# Patient Record
Sex: Female | Born: 1950 | Race: White | Hispanic: No | Marital: Married | Smoking: Former smoker
Health system: Southern US, Community
[De-identification: ages and names within clinical notes are randomized; demographics above are authoritative.]

## PROBLEM LIST (undated history)

## (undated) DIAGNOSIS — IMO0002 Reserved for concepts with insufficient information to code with codable children: Secondary | ICD-10-CM

## (undated) DIAGNOSIS — N816 Rectocele: Secondary | ICD-10-CM

## (undated) DIAGNOSIS — N809 Endometriosis, unspecified: Secondary | ICD-10-CM

## (undated) DIAGNOSIS — M8580 Other specified disorders of bone density and structure, unspecified site: Secondary | ICD-10-CM

## (undated) DIAGNOSIS — J45909 Unspecified asthma, uncomplicated: Secondary | ICD-10-CM

## (undated) DIAGNOSIS — N800 Endometriosis of uterus: Secondary | ICD-10-CM

## (undated) DIAGNOSIS — G43909 Migraine, unspecified, not intractable, without status migrainosus: Secondary | ICD-10-CM

## (undated) DIAGNOSIS — N951 Menopausal and female climacteric states: Secondary | ICD-10-CM

## (undated) DIAGNOSIS — Z1371 Encounter for nonprocreative screening for genetic disease carrier status: Secondary | ICD-10-CM

## (undated) DIAGNOSIS — N814 Uterovaginal prolapse, unspecified: Secondary | ICD-10-CM

## (undated) DIAGNOSIS — D219 Benign neoplasm of connective and other soft tissue, unspecified: Secondary | ICD-10-CM

## (undated) DIAGNOSIS — N8003 Adenomyosis of the uterus: Secondary | ICD-10-CM

## (undated) DIAGNOSIS — Z9889 Other specified postprocedural states: Secondary | ICD-10-CM

## (undated) DIAGNOSIS — R112 Nausea with vomiting, unspecified: Secondary | ICD-10-CM

## (undated) DIAGNOSIS — E7800 Pure hypercholesterolemia, unspecified: Secondary | ICD-10-CM

## (undated) HISTORY — PX: NASAL SINUS SURGERY: SHX719

## (undated) HISTORY — PX: BLADDER SUSPENSION: SHX72

## (undated) HISTORY — DX: Uterovaginal prolapse, unspecified: N81.4

## (undated) HISTORY — DX: Migraine, unspecified, not intractable, without status migrainosus: G43.909

## (undated) HISTORY — DX: Unspecified asthma, uncomplicated: J45.909

## (undated) HISTORY — DX: Encounter for nonprocreative screening for genetic disease carrier status: Z13.71

## (undated) HISTORY — DX: Endometriosis, unspecified: N80.9

## (undated) HISTORY — PX: HYMENECTOMY: SHX987

## (undated) HISTORY — DX: Reserved for concepts with insufficient information to code with codable children: IMO0002

## (undated) HISTORY — DX: Menopausal and female climacteric states: N95.1

## (undated) HISTORY — PX: TONSILLECTOMY: SUR1361

## (undated) HISTORY — DX: Adenomyosis of the uterus: N80.03

## (undated) HISTORY — PX: APPENDECTOMY: SHX54

## (undated) HISTORY — DX: Endometriosis of uterus: N80.0

## (undated) HISTORY — DX: Rectocele: N81.6

## (undated) HISTORY — DX: Benign neoplasm of connective and other soft tissue, unspecified: D21.9

## (undated) HISTORY — DX: Pure hypercholesterolemia, unspecified: E78.00

## (undated) HISTORY — DX: Other specified disorders of bone density and structure, unspecified site: M85.80

---

## 1997-11-20 HISTORY — PX: BACK SURGERY: SHX140

## 1998-06-21 ENCOUNTER — Other Ambulatory Visit: Admission: RE | Admit: 1998-06-21 | Discharge: 1998-06-21 | Payer: Self-pay | Admitting: Obstetrics and Gynecology

## 1998-10-22 ENCOUNTER — Ambulatory Visit (HOSPITAL_COMMUNITY): Admission: RE | Admit: 1998-10-22 | Discharge: 1998-10-23 | Payer: Self-pay | Admitting: Neurology

## 1998-10-22 ENCOUNTER — Encounter: Payer: Self-pay | Admitting: Neurology

## 1998-11-05 ENCOUNTER — Encounter: Payer: Self-pay | Admitting: Neurosurgery

## 1998-11-09 ENCOUNTER — Encounter: Payer: Self-pay | Admitting: Neurosurgery

## 1998-11-09 ENCOUNTER — Inpatient Hospital Stay (HOSPITAL_COMMUNITY): Admission: RE | Admit: 1998-11-09 | Discharge: 1998-11-10 | Payer: Self-pay | Admitting: Neurosurgery

## 1998-12-21 ENCOUNTER — Other Ambulatory Visit: Admission: RE | Admit: 1998-12-21 | Discharge: 1998-12-21 | Payer: Self-pay | Admitting: Obstetrics and Gynecology

## 1999-10-31 ENCOUNTER — Other Ambulatory Visit: Admission: RE | Admit: 1999-10-31 | Discharge: 1999-10-31 | Payer: Self-pay | Admitting: *Deleted

## 1999-10-31 ENCOUNTER — Encounter (INDEPENDENT_AMBULATORY_CARE_PROVIDER_SITE_OTHER): Payer: Self-pay

## 1999-12-27 ENCOUNTER — Other Ambulatory Visit: Admission: RE | Admit: 1999-12-27 | Discharge: 1999-12-27 | Payer: Self-pay | Admitting: Gynecology

## 2001-01-04 ENCOUNTER — Other Ambulatory Visit: Admission: RE | Admit: 2001-01-04 | Discharge: 2001-01-04 | Payer: Self-pay | Admitting: Obstetrics and Gynecology

## 2001-05-23 ENCOUNTER — Encounter: Payer: Self-pay | Admitting: Gastroenterology

## 2001-05-23 ENCOUNTER — Emergency Department (HOSPITAL_COMMUNITY): Admission: EM | Admit: 2001-05-23 | Discharge: 2001-05-23 | Payer: Self-pay | Admitting: Emergency Medicine

## 2001-05-24 ENCOUNTER — Ambulatory Visit (HOSPITAL_COMMUNITY): Admission: RE | Admit: 2001-05-24 | Discharge: 2001-05-24 | Payer: Self-pay | Admitting: Gastroenterology

## 2001-05-28 ENCOUNTER — Ambulatory Visit (HOSPITAL_COMMUNITY): Admission: RE | Admit: 2001-05-28 | Discharge: 2001-05-28 | Payer: Self-pay | Admitting: Gastroenterology

## 2001-05-28 ENCOUNTER — Encounter: Payer: Self-pay | Admitting: Gastroenterology

## 2001-07-18 ENCOUNTER — Ambulatory Visit (HOSPITAL_COMMUNITY): Admission: RE | Admit: 2001-07-18 | Discharge: 2001-07-18 | Payer: Self-pay | Admitting: Gastroenterology

## 2002-01-08 ENCOUNTER — Other Ambulatory Visit: Admission: RE | Admit: 2002-01-08 | Discharge: 2002-01-08 | Payer: Self-pay | Admitting: Obstetrics and Gynecology

## 2003-12-04 ENCOUNTER — Other Ambulatory Visit: Admission: RE | Admit: 2003-12-04 | Discharge: 2003-12-04 | Payer: Self-pay | Admitting: Obstetrics and Gynecology

## 2004-12-06 ENCOUNTER — Other Ambulatory Visit: Admission: RE | Admit: 2004-12-06 | Discharge: 2004-12-06 | Payer: Self-pay | Admitting: Obstetrics and Gynecology

## 2005-12-11 ENCOUNTER — Other Ambulatory Visit: Admission: RE | Admit: 2005-12-11 | Discharge: 2005-12-11 | Payer: Self-pay | Admitting: Obstetrics and Gynecology

## 2006-12-25 ENCOUNTER — Other Ambulatory Visit: Admission: RE | Admit: 2006-12-25 | Discharge: 2006-12-25 | Payer: Self-pay | Admitting: Obstetrics and Gynecology

## 2008-02-05 ENCOUNTER — Other Ambulatory Visit: Admission: RE | Admit: 2008-02-05 | Discharge: 2008-02-05 | Payer: Self-pay | Admitting: Obstetrics and Gynecology

## 2009-03-03 ENCOUNTER — Encounter: Payer: Self-pay | Admitting: Obstetrics and Gynecology

## 2009-03-03 ENCOUNTER — Other Ambulatory Visit: Admission: RE | Admit: 2009-03-03 | Discharge: 2009-03-03 | Payer: Self-pay | Admitting: Addiction Medicine

## 2009-03-03 ENCOUNTER — Ambulatory Visit: Payer: Self-pay | Admitting: Obstetrics and Gynecology

## 2009-11-20 HISTORY — PX: VAGINAL HYSTERECTOMY: SUR661

## 2010-03-16 ENCOUNTER — Other Ambulatory Visit: Admission: RE | Admit: 2010-03-16 | Discharge: 2010-03-16 | Payer: Self-pay | Admitting: Obstetrics and Gynecology

## 2010-03-16 ENCOUNTER — Ambulatory Visit: Payer: Self-pay | Admitting: Obstetrics and Gynecology

## 2010-08-08 ENCOUNTER — Ambulatory Visit: Payer: Self-pay | Admitting: Obstetrics and Gynecology

## 2010-09-05 ENCOUNTER — Inpatient Hospital Stay (HOSPITAL_COMMUNITY): Admission: RE | Admit: 2010-09-05 | Discharge: 2010-09-07 | Payer: Self-pay | Admitting: Obstetrics and Gynecology

## 2010-09-05 ENCOUNTER — Encounter: Payer: Self-pay | Admitting: Obstetrics and Gynecology

## 2010-09-05 ENCOUNTER — Ambulatory Visit: Payer: Self-pay | Admitting: Obstetrics and Gynecology

## 2010-09-21 ENCOUNTER — Ambulatory Visit: Payer: Self-pay | Admitting: Obstetrics and Gynecology

## 2010-10-06 ENCOUNTER — Ambulatory Visit: Payer: Self-pay | Admitting: Obstetrics and Gynecology

## 2010-11-28 ENCOUNTER — Ambulatory Visit
Admission: RE | Admit: 2010-11-28 | Discharge: 2010-11-28 | Payer: Self-pay | Source: Home / Self Care | Attending: Obstetrics and Gynecology | Admitting: Obstetrics and Gynecology

## 2010-12-09 ENCOUNTER — Other Ambulatory Visit: Payer: Self-pay | Admitting: Gynecology

## 2010-12-09 ENCOUNTER — Ambulatory Visit
Admission: RE | Admit: 2010-12-09 | Discharge: 2010-12-09 | Payer: Self-pay | Source: Home / Self Care | Attending: Gynecology | Admitting: Gynecology

## 2011-02-02 LAB — CBC
HCT: 45.9 % (ref 36.0–46.0)
Hemoglobin: 15.7 g/dL — ABNORMAL HIGH (ref 12.0–15.0)
MCH: 31.5 pg (ref 26.0–34.0)
MCHC: 34.3 g/dL (ref 30.0–36.0)
MCV: 91.8 fL (ref 78.0–100.0)
Platelets: 206 10*3/uL (ref 150–400)
RBC: 5 MIL/uL (ref 3.87–5.11)
RDW: 13.3 % (ref 11.5–15.5)
WBC: 6.5 10*3/uL (ref 4.0–10.5)

## 2011-02-02 LAB — URINE MICROSCOPIC-ADD ON

## 2011-02-02 LAB — URINALYSIS, ROUTINE W REFLEX MICROSCOPIC
Glucose, UA: NEGATIVE mg/dL
Hgb urine dipstick: NEGATIVE
pH: 5.5 (ref 5.0–8.0)

## 2011-02-02 LAB — SURGICAL PCR SCREEN
MRSA, PCR: NEGATIVE
Staphylococcus aureus: NEGATIVE

## 2011-05-08 ENCOUNTER — Encounter (INDEPENDENT_AMBULATORY_CARE_PROVIDER_SITE_OTHER): Payer: BC Managed Care – PPO | Admitting: Obstetrics and Gynecology

## 2011-05-08 DIAGNOSIS — Z01419 Encounter for gynecological examination (general) (routine) without abnormal findings: Secondary | ICD-10-CM

## 2011-05-08 DIAGNOSIS — R82998 Other abnormal findings in urine: Secondary | ICD-10-CM

## 2011-07-14 ENCOUNTER — Other Ambulatory Visit: Payer: Self-pay | Admitting: Obstetrics and Gynecology

## 2011-10-21 HISTORY — PX: COLONOSCOPY: SHX174

## 2012-02-01 ENCOUNTER — Telehealth: Payer: Self-pay | Admitting: *Deleted

## 2012-02-01 MED ORDER — ESTRADIOL 0.5 MG PO TABS: 0.5000 mg | ORAL_TABLET | Freq: Every day | ORAL | Status: DC

## 2012-02-01 NOTE — Telephone Encounter (Signed)
Pt informed with the below note. rx sent to pharmacy 

## 2012-02-01 NOTE — Telephone Encounter (Signed)
Pt called lost her femring in toilet, she would like to know if she could begin oral medication again? Please advise

## 2012-02-01 NOTE — Telephone Encounter (Signed)
Chart please 

## 2012-02-01 NOTE — Telephone Encounter (Signed)
Chart on desk

## 2012-02-01 NOTE — Telephone Encounter (Signed)
Estradiol half a milligram daily. Tell patient is she symptomatic we can increase it to 1 mg daily.

## 2012-02-26 ENCOUNTER — Other Ambulatory Visit: Payer: Self-pay | Admitting: Obstetrics and Gynecology

## 2012-02-26 NOTE — Telephone Encounter (Signed)
Called into pharmacy

## 2012-04-30 ENCOUNTER — Encounter: Payer: Self-pay | Admitting: Gynecology

## 2012-04-30 DIAGNOSIS — IMO0002 Reserved for concepts with insufficient information to code with codable children: Secondary | ICD-10-CM | POA: Insufficient documentation

## 2012-04-30 DIAGNOSIS — D219 Benign neoplasm of connective and other soft tissue, unspecified: Secondary | ICD-10-CM | POA: Insufficient documentation

## 2012-04-30 DIAGNOSIS — M8580 Other specified disorders of bone density and structure, unspecified site: Secondary | ICD-10-CM | POA: Insufficient documentation

## 2012-04-30 DIAGNOSIS — N814 Uterovaginal prolapse, unspecified: Secondary | ICD-10-CM | POA: Insufficient documentation

## 2012-04-30 DIAGNOSIS — G43909 Migraine, unspecified, not intractable, without status migrainosus: Secondary | ICD-10-CM | POA: Insufficient documentation

## 2012-04-30 DIAGNOSIS — R32 Unspecified urinary incontinence: Secondary | ICD-10-CM | POA: Insufficient documentation

## 2012-04-30 DIAGNOSIS — N816 Rectocele: Secondary | ICD-10-CM | POA: Insufficient documentation

## 2012-04-30 DIAGNOSIS — N951 Menopausal and female climacteric states: Secondary | ICD-10-CM | POA: Insufficient documentation

## 2012-04-30 DIAGNOSIS — J45909 Unspecified asthma, uncomplicated: Secondary | ICD-10-CM | POA: Insufficient documentation

## 2012-05-08 ENCOUNTER — Ambulatory Visit (INDEPENDENT_AMBULATORY_CARE_PROVIDER_SITE_OTHER): Payer: BC Managed Care – PPO | Admitting: Obstetrics and Gynecology

## 2012-05-08 ENCOUNTER — Encounter: Payer: Self-pay | Admitting: Obstetrics and Gynecology

## 2012-05-08 VITALS — BP 124/80 | Ht 62.75 in | Wt 149.0 lb

## 2012-05-08 DIAGNOSIS — M8580 Other specified disorders of bone density and structure, unspecified site: Secondary | ICD-10-CM

## 2012-05-08 DIAGNOSIS — Z01419 Encounter for gynecological examination (general) (routine) without abnormal findings: Secondary | ICD-10-CM

## 2012-05-08 DIAGNOSIS — M899 Disorder of bone, unspecified: Secondary | ICD-10-CM

## 2012-05-08 DIAGNOSIS — M949 Disorder of cartilage, unspecified: Secondary | ICD-10-CM

## 2012-05-08 MED ORDER — ESTRADIOL ACETATE 0.1 MG/24HR VA RING: VAGINAL_RING | VAGINAL | Status: DC

## 2012-05-08 NOTE — Addendum Note (Signed)
Addended by: Bertram Savin A on: 05/08/2012 03:13 PM   Modules accepted: Orders

## 2012-05-08 NOTE — Progress Notes (Signed)
The patient came to see me today for her annual GYN exam. She is using a Femring 0.05 mg. She's not having any systemic symptoms on that. She is however having some vaginal dryness. She says it's not bad but she would like to see it better. She is having no vaginal bleeding. She is having no pelvic pain. She is scheduled for yearly mammogram. She is due for followup bone density for osteopenia without an elevated FRAX risk. She's had no fractures. She has never had cervical dysplasia. Her last Pap smear was April 2011 and was normal.  HEENT: Within normal limits. Blanca present. Neck: No masses. Supraclavicular lymph nodes: Not enlarged. Breasts: Examined in both sitting and lying position. Symmetrical without skin changes or masses. Abdomen: Soft no masses guarding or rebound. No hernias. Pelvic: External within normal limits. BUS within normal limits. Vaginal examination shows fair estrogen effect, no cystocele enterocele or rectocele. Cervix and uterus absent. Adnexa within normal limits. Rectovaginal confirmatory. Extremities within normal limits.  Assessment: Menopausal symptoms resolved with Femring. Symptomatic atrophic vaginitis.  Plan: Mammogram. Bone density. Increased Femring from 0.05 mg to 1 mg. Lab work through PCP.  No pap  done.

## 2012-05-10 ENCOUNTER — Telehealth: Payer: Self-pay | Admitting: *Deleted

## 2012-05-10 NOTE — Telephone Encounter (Signed)
Pt called stating she no longer takes Pravastatin Sodium & estradiol 0.5 mg she would like removed from her chart.

## 2012-05-15 ENCOUNTER — Encounter: Payer: Self-pay | Admitting: Obstetrics and Gynecology

## 2012-06-12 ENCOUNTER — Encounter: Payer: Self-pay | Admitting: Obstetrics and Gynecology

## 2012-07-03 ENCOUNTER — Ambulatory Visit (INDEPENDENT_AMBULATORY_CARE_PROVIDER_SITE_OTHER): Payer: BC Managed Care – PPO

## 2012-07-03 DIAGNOSIS — M899 Disorder of bone, unspecified: Secondary | ICD-10-CM

## 2012-07-03 DIAGNOSIS — M8580 Other specified disorders of bone density and structure, unspecified site: Secondary | ICD-10-CM

## 2012-08-19 ENCOUNTER — Ambulatory Visit (INDEPENDENT_AMBULATORY_CARE_PROVIDER_SITE_OTHER): Payer: BC Managed Care – PPO | Admitting: Obstetrics and Gynecology

## 2012-08-19 DIAGNOSIS — N950 Postmenopausal bleeding: Secondary | ICD-10-CM

## 2012-08-19 DIAGNOSIS — R319 Hematuria, unspecified: Secondary | ICD-10-CM

## 2012-08-19 LAB — URINALYSIS W MICROSCOPIC + REFLEX CULTURE
Nitrite: NEGATIVE
Protein, ur: NEGATIVE mg/dL
Urobilinogen, UA: 0.2 mg/dL (ref 0.0–1.0)

## 2012-08-19 MED ORDER — OSELTAMIVIR PHOSPHATE 75 MG PO CAPS: 75.0000 mg | ORAL_CAPSULE | Freq: Two times a day (BID) | ORAL | Status: DC

## 2012-08-19 MED ORDER — CIPROFLOXACIN HCL 500 MG PO TABS: 500.0000 mg | ORAL_TABLET | Freq: Two times a day (BID) | ORAL | Status: DC

## 2012-08-19 MED ORDER — ESTRADIOL 1 MG PO TABS: 1.0000 mg | ORAL_TABLET | Freq: Every day | ORAL | Status: DC

## 2012-08-19 NOTE — Patient Instructions (Signed)
Return to the office in 8 weeks.

## 2012-08-19 NOTE — Progress Notes (Signed)
Patient came to see me today because she noticed blood in her urine after voiding. She is having no other urinary symptoms. She also will notice approximately once a month some vaginal spotting. She uses Femring both for systemic and vaginal estrogen replacement.  Exam: Kennon Portela present. External: Within normal limits. BUS: Within normal limits. Vaginal exam: Some estrogen atrophy. When I removed the ring she had Spot bleeding at the top of the vagina where the ring was touching. Urinalysis was unremarkable with just a few red blood cells.  Assessment: #1. Hematuria #2. Atrophic vaginitis with irritation at site of vaginal ring.  Plan: We took out her Femring. We started on estradiol 1 mg daily. She will return in 8 weeks for exam. Urine culture done. Patient going to [place] and  we give her prescription for Cipro and Tamiflu.

## 2012-08-21 LAB — URINE CULTURE: Colony Count: 6000

## 2012-10-11 ENCOUNTER — Ambulatory Visit (INDEPENDENT_AMBULATORY_CARE_PROVIDER_SITE_OTHER): Payer: BC Managed Care – PPO | Admitting: Obstetrics and Gynecology

## 2012-10-11 DIAGNOSIS — Z780 Asymptomatic menopausal state: Secondary | ICD-10-CM

## 2012-10-11 DIAGNOSIS — N952 Postmenopausal atrophic vaginitis: Secondary | ICD-10-CM

## 2012-10-11 MED ORDER — ESTRADIOL 1 MG PO TABS: 1.0000 mg | ORAL_TABLET | Freq: Every day | ORAL | Status: DC

## 2012-10-11 NOTE — Patient Instructions (Signed)
Continue oral estradiol

## 2012-10-11 NOTE — Progress Notes (Signed)
Patient came back to see me today. We have changed her from vaginal estrogen-Femring to oral estrogen to the ring irritating her vagina and causing some vaginal spotting. Since she is switched she is seeing no more spotting. She also commented that she feels better on oral estrogen and is having better vaginal lubrication.  Exam: Temp Gardener present. External: Within normal limits. BUS: Within normal limits. Vagina: Much better estrogenized with better rugae. No more irritation.  Assessment: Menopausal symptoms resolved. Vaginal spotting resolved.  Plan: Continue estradiol 1 mg daily

## 2012-11-05 ENCOUNTER — Other Ambulatory Visit: Payer: Self-pay | Admitting: Obstetrics and Gynecology

## 2012-12-02 ENCOUNTER — Other Ambulatory Visit: Payer: Self-pay | Admitting: Obstetrics and Gynecology

## 2012-12-10 ENCOUNTER — Telehealth: Payer: Self-pay | Admitting: *Deleted

## 2012-12-10 MED ORDER — AMITRIPTYLINE HCL 25 MG PO TABS: 50.0000 mg | ORAL_TABLET | Freq: Every day | ORAL | Status: DC

## 2012-12-10 NOTE — Telephone Encounter (Signed)
Pt was advised that Dr Audie Box is not going to continue to prescribe this medication. He does not feel comfortable prescribing this for sleep. Dr Reece Agar did not document this in the patients chart. The patient was advised to follow up with a PCP to discuss the need of this medication. A one month supply of the amitriptyline 25 mg 2 qhs will be called to her pharmacy to give her time to find a PCP. To note the patient was not happy with this. KW

## 2012-12-10 NOTE — Telephone Encounter (Signed)
As discussed and documented by Sherrilyn Rist.

## 2012-12-10 NOTE — Telephone Encounter (Signed)
Pt Rx for amitriptyline 25 mg tablets #180 2 tablets at bedtime was denied this am, pt called and said she has been on medication since year 2000 and it helps her sleep. Pt said you will be her physician now. Please advise

## 2012-12-10 NOTE — Telephone Encounter (Signed)
Pharmacy notified.

## 2013-04-07 ENCOUNTER — Other Ambulatory Visit: Payer: Self-pay | Admitting: Cardiovascular Disease

## 2013-04-07 LAB — COMPREHENSIVE METABOLIC PANEL
AST: 39 U/L — ABNORMAL HIGH (ref 0–37)
Albumin: 4.2 g/dL (ref 3.5–5.2)
Alkaline Phosphatase: 59 U/L (ref 39–117)
Glucose, Bld: 87 mg/dL (ref 70–99)
Potassium: 4.1 mEq/L (ref 3.5–5.3)
Sodium: 140 mEq/L (ref 135–145)
Total Protein: 6.7 g/dL (ref 6.0–8.3)

## 2013-04-07 LAB — CBC WITH DIFFERENTIAL/PLATELET
Basophils Relative: 1 % (ref 0–1)
Hemoglobin: 14.9 g/dL (ref 12.0–15.0)
MCHC: 33.9 g/dL (ref 30.0–36.0)
Monocytes Relative: 8 % (ref 3–12)
Neutro Abs: 3.2 10*3/uL (ref 1.7–7.7)
Neutrophils Relative %: 61 % (ref 43–77)
RBC: 5.07 MIL/uL (ref 3.87–5.11)
WBC: 5.2 10*3/uL (ref 4.0–10.5)

## 2013-04-07 LAB — LIPID PANEL
LDL Cholesterol: 88 mg/dL (ref 0–99)
Triglycerides: 89 mg/dL (ref <150)

## 2013-04-07 LAB — TSH: TSH: 3.757 u[IU]/mL (ref 0.350–4.500)

## 2013-04-07 LAB — T4, FREE: Free T4: 0.91 ng/dL (ref 0.80–1.80)

## 2013-04-08 LAB — VITAMIN D 25 HYDROXY (VIT D DEFICIENCY, FRACTURES): Vit D, 25-Hydroxy: 61 ng/mL (ref 30–89)

## 2013-04-17 ENCOUNTER — Ambulatory Visit (HOSPITAL_COMMUNITY): Payer: BC Managed Care – PPO

## 2013-04-18 ENCOUNTER — Ambulatory Visit (HOSPITAL_COMMUNITY)
Admission: RE | Admit: 2013-04-18 | Discharge: 2013-04-18 | Disposition: A | Discharge status: Home / Self Care | Payer: BC Managed Care – PPO | Source: Ambulatory Visit | Attending: Cardiovascular Disease | Admitting: Cardiovascular Disease

## 2013-04-18 ENCOUNTER — Other Ambulatory Visit (HOSPITAL_COMMUNITY): Payer: Self-pay | Admitting: Cardiovascular Disease

## 2013-04-18 DIAGNOSIS — E785 Hyperlipidemia, unspecified: Secondary | ICD-10-CM | POA: Insufficient documentation

## 2013-04-18 DIAGNOSIS — I079 Rheumatic tricuspid valve disease, unspecified: Secondary | ICD-10-CM | POA: Insufficient documentation

## 2013-04-18 DIAGNOSIS — R011 Cardiac murmur, unspecified: Secondary | ICD-10-CM

## 2013-04-18 DIAGNOSIS — Z8249 Family history of ischemic heart disease and other diseases of the circulatory system: Secondary | ICD-10-CM | POA: Insufficient documentation

## 2013-04-18 NOTE — Progress Notes (Signed)
[facility]   2D echo completed 04/18/2013.   Cindy Jeray Shugart, RDCS  

## 2013-04-23 ENCOUNTER — Encounter: Payer: Self-pay | Admitting: Cardiovascular Disease

## 2013-05-27 ENCOUNTER — Other Ambulatory Visit: Payer: Self-pay | Admitting: Cardiovascular Disease

## 2013-05-27 LAB — COMPREHENSIVE METABOLIC PANEL
ALT: 42 U/L — ABNORMAL HIGH (ref 0–35)
AST: 32 U/L (ref 0–37)
Alkaline Phosphatase: 63 U/L (ref 39–117)
Creat: 0.87 mg/dL (ref 0.50–1.10)
Total Bilirubin: 1.1 mg/dL (ref 0.3–1.2)

## 2013-05-27 LAB — SEDIMENTATION RATE: Sed Rate: 4 mm/hr (ref 0–22)

## 2013-06-03 ENCOUNTER — Telehealth (HOSPITAL_COMMUNITY): Payer: Self-pay | Admitting: Cardiovascular Disease

## 2013-06-13 ENCOUNTER — Other Ambulatory Visit: Payer: Self-pay | Admitting: Cardiovascular Disease

## 2013-06-13 LAB — HEPATIC FUNCTION PANEL
Bilirubin, Direct: 0.1 mg/dL (ref 0.0–0.3)
Indirect Bilirubin: 0.8 mg/dL (ref 0.0–0.9)
Total Protein: 6.7 g/dL (ref 6.0–8.3)

## 2013-06-16 LAB — HEPATITIS PANEL, ACUTE
HCV Ab: NEGATIVE
Hep A IgM: NEGATIVE

## 2013-06-26 ENCOUNTER — Telehealth (HOSPITAL_COMMUNITY): Payer: Self-pay | Admitting: Cardiovascular Disease

## 2013-06-27 NOTE — Telephone Encounter (Signed)
Returning your call concerning test results

## 2013-06-27 NOTE — Telephone Encounter (Signed)
Returned call.  Lab results given to pt.  Pt also informed a call was made to her yesterday in regards of scheduling a stress test.  Pt also wanting to talk w/ Pam in Billing.  Stated they have been playing phone tag and she wants to clear that up before scheduling an appt for anything esle.  Pt transferred.

## 2013-07-28 ENCOUNTER — Telehealth: Payer: Self-pay | Admitting: Neurology

## 2013-07-28 NOTE — Telephone Encounter (Signed)
Please assign patient to get scheduled for appt. Patient requesting Dr. Desiree Hane.

## 2013-07-30 NOTE — Telephone Encounter (Signed)
From what I could see, we have not seen pt, (in centricity).  She will need a new referral.

## 2013-07-31 ENCOUNTER — Telehealth: Payer: Self-pay | Admitting: Cardiovascular Disease

## 2013-07-31 NOTE — Telephone Encounter (Signed)
Need refill on Vitamin D 50,ooo units.

## 2013-07-31 NOTE — Telephone Encounter (Signed)
Returned call.  Left message to fax refill request to [phone].

## 2013-08-05 ENCOUNTER — Telehealth: Payer: Self-pay | Admitting: Cardiovascular Disease

## 2013-08-05 NOTE — Telephone Encounter (Signed)
Message forwarded to [facility] Berlinda Last, LPN to discuss w/ Dr. Alanda Amass.  This note and paper chart# [id number] placed on Dr. Kandis Cocking cart.

## 2013-08-05 NOTE — Telephone Encounter (Signed)
She needs a referral for Dr Dohlmeir-Dr Alanda Amass is aware of this. You can text her and let her know that you did this.

## 2013-08-08 ENCOUNTER — Encounter: Payer: Self-pay | Admitting: Neurology

## 2013-08-11 ENCOUNTER — Ambulatory Visit (INDEPENDENT_AMBULATORY_CARE_PROVIDER_SITE_OTHER): Payer: BC Managed Care – PPO | Admitting: Neurology

## 2013-08-11 ENCOUNTER — Encounter: Payer: Self-pay | Admitting: Neurology

## 2013-08-11 VITALS — BP 114/72 | HR 64 | Resp 16 | Ht 63.0 in | Wt 154.0 lb

## 2013-08-11 DIAGNOSIS — G43909 Migraine, unspecified, not intractable, without status migrainosus: Secondary | ICD-10-CM

## 2013-08-11 DIAGNOSIS — R0609 Other forms of dyspnea: Secondary | ICD-10-CM

## 2013-08-11 DIAGNOSIS — R0683 Snoring: Secondary | ICD-10-CM

## 2013-08-11 DIAGNOSIS — G4700 Insomnia, unspecified: Secondary | ICD-10-CM

## 2013-08-11 MED ORDER — AMITRIPTYLINE HCL 50 MG PO TABS: 50.0000 mg | ORAL_TABLET | Freq: Two times a day (BID) | ORAL | Status: DC

## 2013-08-11 MED ORDER — SUMATRIPTAN SUCCINATE 100 MG PO TABS: 100.0000 mg | ORAL_TABLET | ORAL | Status: DC

## 2013-08-11 NOTE — Patient Instructions (Addendum)
[facility]  Provider:  Melvyn Novas, M D  Referring Provider: No ref. provider found Primary Care Physician:  Dr. Susa Griffins, MD, [place] [facility] [place].   Chief Complaint  Patient presents with  . New Evaluation    sleep disorder,[place],rm 10    HPI:  Courtney Horne is a 62 y.o. female  Is seen here as a referral/ revisit  from Dr. Alanda Amass for a migraine and  insomnia evaluation.   The patient had for many years some insomnia, would wake up in the midddle of the night and could fall back asleep  She developed sinuitis and allergies, and the lack of sleep resulted in increasing Sinus and Migraine headaches.  Sleep habits finally changed with menopause around [age] and she had increasing difficulties to fall asleep.  Once her allergy specialist and her ENT declared her "cleared "  By her mid-fourties , she was referred to Dr Nolon Nations and diagnosed with migraines, treated successfully with Imitrex.  Dr. Edyth Gunnels had been her Imitrex and Amitriptyline pre-scriber and her headaches and sleep were again well controlled.   Work hours  7 Am to 12 noon, just started part time a couple of month ago. Sleepiness after  Lunch , but  Napping a few days a week. She likes  one cup of coffee  in AM and one in the afternoon at 3 PM. Not a  Soda,  tea,  But a water drinker.  Bedtimes are  10.30 lights out, and sleeps within 30 minutes or less, one bathroom break, snoring rarely- no observed apneas.  Not sleepy as a child, not sleep walking or sleep talking, and she has not been suffering from night terrors. She dreams,but  only occasionally recalls her dreams.  She sometimes wakes up with headaches, not from headaches, she sees a correlation to dehydration.   ETOH may provoke headaches.  Weather changes will bring on headache.    FSS 14, GDS 0 , Epworth 6 .           Pharmacist at [employer].      Review of Systems: Out of a complete 14 system review,  the patient complains of only the following symptoms, and all other reviewed systems are negative. See above -   History   Social History  . Marital Status: Married    Spouse Name: [name]    Number of Children: 2  . Years of Education: 16   Occupational History  .      [employer]   Social History Main Topics  . Smoking status: Former Smoker    Quit date: 05/08/1972  . Smokeless tobacco: Never Used  . Alcohol Use: Yes     Comment: occas. wine(twice weekly)  . Drug Use: No  . Sexual Activity: Yes    Birth Control/ Protection: Surgical   Other Topics Concern  . Not on file   Social History Narrative  . No narrative on file    Family History  Problem Relation Age of Onset  . Diabetes Mother   . Leukemia Mother   . Cancer Mother     leukemia  . Heart disease Father   . Retinitis pigmentosa Father   . Migraines Father     Past Medical History  Diagnosis Date  . Osteopenia   . Fibroid   . Adenomyosis   . Uterine prolapse   . Urinary incontinence   . Cystocele   . Rectocele   . Menopausal symptoms   . Migraine   .  Asthma   . Asthma   . High cholesterol   . Migraine     Past Surgical History  Procedure Laterality Date  . Appendectomy    . Vaginal hysterectomy  2011    Vag. Hyst BSO A/P repair-Sling  . Tonsillectomy    . Nasal sinus surgery    . Back surgery  1999    L4-L5  . Colonoscopy  10/2011    Current Outpatient Prescriptions  Medication Sig Dispense Refill  . amitriptyline (ELAVIL) 50 MG tablet Take 50 mg by mouth 2 (two) times daily.      Marland Kitchen aspirin 81 MG tablet Take 81 mg by mouth daily.      Marland Kitchen atorvastatin (LIPITOR) 40 MG tablet Take 40 mg by mouth daily.      . cetirizine (ZYRTEC) 10 MG tablet Take 10 mg by mouth daily.      . ciprofloxacin (CIPRO) 500 MG tablet Take 500 mg by mouth 2 (two) times daily. As needed      . estradiol (ESTRACE) 1 MG tablet Take 1 tablet (1 mg total) by mouth daily.  90 tablet  4  . Multiple Vitamin  (MULTIVITAMIN) tablet Take 1 tablet by mouth daily.      . Pantoprazole Sodium (PROTONIX PO) Take by mouth.      . Pravastatin Sodium (PRAVACHOL PO) Take by mouth.      . SUMAtriptan (IMITREX) 100 MG tablet TAKE 1 TABLET AT ONSET OF MIGRAINE. MAY REPEAT ONCE IN 2 HOURS. MAX 2/24 HOURS  27 tablet  5  . Vitamin D, Ergocalciferol, (DRISDOL) 50000 UNITS CAPS TAKE 1 CAPSULE WEEKLY. **NEED YEARLY CHECK UP NOW :) **  4 capsule  0   No current facility-administered medications for this visit.    Allergies as of 08/11/2013 - Review Complete 08/11/2013  Allergen Reaction Noted  . Codeine Nausea And Vomiting 04/30/2012  . Penicillins  04/30/2012    Vitals: BP 114/72  Pulse 64  Resp 16  Ht 5\' 3"  (1.6 m)  Wt 154 lb (69.854 kg)  BMI 27.29 kg/m2 Last Weight:  Wt Readings from Last 1 Encounters:  08/11/13 154 lb (69.854 kg)   Last Height:   Ht Readings from Last 1 Encounters:  08/11/13 5\' 3"  (1.6 m)    Physical exam:  General: The patient is awake, alert and appears not in acute distress. The patient is well groomed. Head: Normocephalic, atraumatic. Neck is supple.no goiter ,  Mallampati 2 , neck circumference:13.75 Cardiovascular:  Regular rate and rhythm, without  murmurs or carotid bruit, and without distended neck veins. Respiratory: Lungs are clear to auscultation. Skin:  Without evidence of edema, or rash Trunk: BMI is normal .  Neurologic exam : The patient is awake and alert, oriented to place and time.  Memory subjective  described as intact. There is a normal attention span & concentration ability.  Speech is fluent without  dysarthria, dysphonia or aphasia. Mood and affect are appropriate.  Cranial nerves: Pupils are equal and briskly reactive to light. Funduscopic exam without  evidence of pallor or edema. Extraocular movements  in vertical and horizontal planes intact and without nystagmus. Visual fields by finger perimetry are intact. Hearing to finger rub intact.  Facial  sensation intact to fine touch. Facial motor strength is symmetric and tongue and uvula move midline.  Motor exam:   Normal tone and normal muscle bulk and symmetric normal strength in all extremities. Sensory:  Fine touch, pinprick and vibration were tested in all extremities.  Proprioception is normal. Coordination: Rapid alternating movements in the fingers/hands is tested and normal. Finger-to-nose maneuver without evidence of ataxia, dysmetria or tremor. Gait and station: Patient walks without assistive device. Strength within normal limits. Stance is stable and normal.   Deep tendon reflexes: in the  upper and lower extremities are symmetric and intact.  Babinski maneuver downgoing.   Assessment:  After physical and neurologic examination, review of laboratory studies, imaging, neurophysiology testing and pre-existing records. 1) chronic mild Insomnia. Menopausal onset of worsening. 50mg  amitrityline nightly since 2005 .  2)  Snoring, but not convinced she has apnea.  status post tonsillectomy. Elongated uvula, not a high grade mallompati. Snoring, dry mouth .  3) not obese -and physically active.  Plan ; screening for OSA at home in a HST,  And if positive follow with titration.  Refill medications.              Insomnia Insomnia is frequent trouble falling and/or staying asleep. Insomnia can be a long term problem or a short term problem. Both are common. Insomnia can be a short term problem when the wakefulness is related to a certain stress or worry. Long term insomnia is often related to ongoing stress during waking hours and/or poor sleeping habits. Overtime, sleep deprivation itself can make the problem worse. Every little thing feels more severe because you are overtired and your ability to cope is decreased. CAUSES   Stress, anxiety, and depression.  Poor sleeping habits.  Distractions such as TV in the bedroom.  Naps close to bedtime.  Engaging in emotionally  charged conversations before bed.  Technical reading before sleep.  Alcohol and other sedatives. They may make the problem worse. They can hurt normal sleep patterns and normal dream activity.  Stimulants such as caffeine for several hours prior to bedtime.  Pain syndromes and shortness of breath can cause insomnia.  Exercise late at night.  Changing time zones may cause sleeping problems (jet lag). It is sometimes helpful to have someone observe your sleeping patterns. They should look for periods of not breathing during the night (sleep apnea). They should also look to see how long those periods last. If you live alone or observers are uncertain, you can also be observed at a sleep clinic where your sleep patterns will be professionally monitored. Sleep apnea requires a checkup and treatment. Give your caregivers your medical history. Give your caregivers observations your family has made about your sleep.  SYMPTOMS   Not feeling rested in the morning.  Anxiety and restlessness at bedtime.  Difficulty falling and staying asleep. TREATMENT   Your caregiver may prescribe treatment for an underlying medical disorders. Your caregiver can give advice or help if you are using alcohol or other drugs for self-medication. Treatment of underlying problems will usually eliminate insomnia problems.  Medications can be prescribed for short time use. They are generally not recommended for lengthy use.  Over-the-counter sleep medicines are not recommended for lengthy use. They can be habit forming.  You can promote easier sleeping by making lifestyle changes such as:  Using relaxation techniques that help with breathing and reduce muscle tension.  Exercising earlier in the day.  Changing your diet and the time of your last meal. No night time snacks.  Establish a regular time to go to bed.  Counseling can help with stressful problems and worry.  Soothing music and white noise may be  helpful if there are background noises you cannot remove.  Stop tedious detailed  work at least one hour before bedtime. HOME CARE INSTRUCTIONS   Keep a diary. Inform your caregiver about your progress. This includes any medication side effects. See your caregiver regularly. Take note of:  Times when you are asleep.  Times when you are awake during the night.  The quality of your sleep.  How you feel the next day. This information will help your caregiver care for you.  Get out of bed if you are still awake after 15 minutes. Read or do some quiet activity. Keep the lights down. Wait until you feel sleepy and go back to bed.  Keep regular sleeping and waking hours. Avoid naps.  Exercise regularly.  Avoid distractions at bedtime. Distractions include watching television or engaging in any intense or detailed activity like attempting to balance the household checkbook.  Develop a bedtime ritual. Keep a familiar routine of bathing, brushing your teeth, climbing into bed at the same time each night, listening to soothing music. Routines increase the success of falling to sleep faster.  Use relaxation techniques. This can be using breathing and muscle tension release routines. It can also include visualizing peaceful scenes. You can also help control troubling or intruding thoughts by keeping your mind occupied with boring or repetitive thoughts like the old concept of counting sheep. You can make it more creative like imagining planting one beautiful flower after another in your backyard garden.  During your day, work to eliminate stress. When this is not possible use some of the previous suggestions to help reduce the anxiety that accompanies stressful situations. MAKE SURE YOU:   Understand these instructions.  Will watch your condition.  Will get help right away if you are not doing well or get worse. Document Released: 11/03/2000 Document Revised: 01/29/2012 Document Reviewed:  12/04/2007 [facility] Patient Information 2014 [place], [place]. Insomnia Insomnia is frequent trouble falling and/or staying asleep. Insomnia can be a long term problem or a short term problem. Both are common. Insomnia can be a short term problem when the wakefulness is related to a certain stress or worry. Long term insomnia is often related to ongoing stress during waking hours and/or poor sleeping habits. Overtime, sleep deprivation itself can make the problem worse. Every little thing feels more severe because you are overtired and your ability to cope is decreased. CAUSES   Stress, anxiety, and depression.  Poor sleeping habits.  Distractions such as TV in the bedroom.  Naps close to bedtime.  Engaging in emotionally charged conversations before bed.  Technical reading before sleep.  Alcohol and other sedatives. They may make the problem worse. They can hurt normal sleep patterns and normal dream activity.  Stimulants such as caffeine for several hours prior to bedtime.  Pain syndromes and shortness of breath can cause insomnia.  Exercise late at night.  Changing time zones may cause sleeping problems (jet lag). It is sometimes helpful to have someone observe your sleeping patterns. They should look for periods of not breathing during the night (sleep apnea). They should also look to see how long those periods last. If you live alone or observers are uncertain, you can also be observed at a sleep clinic where your sleep patterns will be professionally monitored. Sleep apnea requires a checkup and treatment. Give your caregivers your medical history. Give your caregivers observations your family has made about your sleep.  SYMPTOMS   Not feeling rested in the morning.  Anxiety and restlessness at bedtime.  Difficulty falling and staying asleep. TREATMENT  Your caregiver may prescribe treatment for an underlying medical disorders. Your caregiver can give advice or help if you  are using alcohol or other drugs for self-medication. Treatment of underlying problems will usually eliminate insomnia problems.  Medications can be prescribed for short time use. They are generally not recommended for lengthy use.  Over-the-counter sleep medicines are not recommended for lengthy use. They can be habit forming.  You can promote easier sleeping by making lifestyle changes such as:  Using relaxation techniques that help with breathing and reduce muscle tension.  Exercising earlier in the day.  Changing your diet and the time of your last meal. No night time snacks.  Establish a regular time to go to bed.  Counseling can help with stressful problems and worry.  Soothing music and white noise may be helpful if there are background noises you cannot remove.  Stop tedious detailed work at least one hour before bedtime. HOME CARE INSTRUCTIONS   Keep a diary. Inform your caregiver about your progress. This includes any medication side effects. See your caregiver regularly. Take note of:  Times when you are asleep.  Times when you are awake during the night.  The quality of your sleep.  How you feel the next day. This information will help your caregiver care for you.  Get out of bed if you are still awake after 15 minutes. Read or do some quiet activity. Keep the lights down. Wait until you feel sleepy and go back to bed.  Keep regular sleeping and waking hours. Avoid naps.  Exercise regularly.  Avoid distractions at bedtime. Distractions include watching television or engaging in any intense or detailed activity like attempting to balance the household checkbook.  Develop a bedtime ritual. Keep a familiar routine of bathing, brushing your teeth, climbing into bed at the same time each night, listening to soothing music. Routines increase the success of falling to sleep faster.  Use relaxation techniques. This can be using breathing and muscle tension release  routines. It can also include visualizing peaceful scenes. You can also help control troubling or intruding thoughts by keeping your mind occupied with boring or repetitive thoughts like the old concept of counting sheep. You can make it more creative like imagining planting one beautiful flower after another in your backyard garden.  During your day, work to eliminate stress. When this is not possible use some of the previous suggestions to help reduce the anxiety that accompanies stressful situations. MAKE SURE YOU:   Understand these instructions.  Will watch your condition.  Will get help right away if you are not doing well or get worse. Document Released: 11/03/2000 Document Revised: 01/29/2012 Document Reviewed: 12/04/2007 [facility] Patient Information 2014 [place], [place].

## 2013-08-11 NOTE — Addendum Note (Signed)
Addended by: Melvyn Novas on: 08/11/2013 02:29 PM   Modules accepted: Orders

## 2013-08-11 NOTE — Progress Notes (Addendum)
[facility]  Provider:  Melvyn Novas, M D  Referring Provider: No ref. provider found Primary Care Physician:  Dr. Susa Griffins, MD, [place] [facility] [place].   Chief Complaint  Patient presents with  . New Evaluation    sleep disorder,[place],rm 10    HPI:  Courtney Horne is a 62 y.o. female  Is seen here as a referral/ revisit  from Dr. Alanda Amass for a migraine and  insomnia evaluation.   The patient had for many years some insomnia, would wake up in the midddle of the night and could fall back asleep  She developed sinuitis and allergies, and the lack of sleep resulted in increasing Sinus and Migraine headaches.  Sleep habits finally changed with menopause around [age] and she had increasing difficulties to fall asleep.  Once her allergy specialist and her ENT declared her "cleared "  By her mid-fourties , she was referred to Dr Nolon Nations and diagnosed with migraines, treated successfully with Imitrex.  Dr. Edyth Gunnels had been her Imitrex and Amitriptyline pre-scriber and her headaches and sleep were again well controlled for many years.  Mrs Bensen has no shift work history, no history of excessive sleepiness.   Her work hours  Are  7 Am to 12 noon, just started part time from full time  a couple of month ago. There is some sleepiness after Lunch , but napping a few days a week.  She likes  one cup of coffee  in AM and one in the afternoon at 3 PM. She is  not a Soda, not a Tea trinker ,  but drinks water .  Bedtimes are : 10.30 lights out, asleep within 30 minutes or less, one bathroom break, snoring rarely- no observed apneas.    Not sleepy as a child, not sleep walking or sleep talking, and she has not been suffering from night terrors. She dreams,but  only occasionally recalls her dreams.  She sometimes wakes up with headaches, not from headaches, she sees a correlation to dehydration.   ETOH may provoke headaches.  Weather changes will bring on  headache.    FSS 14, GDS 0 , Epworth 6 .           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   Review of Systems: Out of a complete 14 system review, the patient complains of only the following symptoms, and all other reviewed systems are negative. See above -   History   Social History  . Marital Status: Married    Spouse Name: Courtney Horne    Number of Children: 2  . Years of Education: 16   Occupational History  .      [employer]   Social History Main Topics  . Smoking status: Former Smoker    Quit date: 05/08/1972  . Smokeless tobacco: Never Used  . Alcohol Use: Yes     Comment: occas. wine(twice weekly)  . Drug Use: No  . Sexual Activity: Yes    Birth Control/ Protection: Surgical   Other Topics Concern  . Not on file   Social History Narrative  . No narrative on file    Family History  Problem Relation Age of Onset  . Diabetes Mother   . Leukemia Mother   . Cancer Mother     leukemia  . Heart disease Father   . Retinitis pigmentosa Father   . Migraines Father     Past Medical History  Diagnosis Date  . Osteopenia   . Fibroid   .  Adenomyosis   . Uterine prolapse   . Urinary incontinence   . Cystocele   . Rectocele   . Menopausal symptoms   . Migraine   . Asthma   . Asthma   . High cholesterol   . Migraine     Past Surgical History  Procedure Laterality Date  . Appendectomy    . Vaginal hysterectomy  2011    Vag. Hyst BSO A/P repair-Sling  . Tonsillectomy    . Nasal sinus surgery    . Back surgery  1999    L4-L5  . Colonoscopy  10/2011    Current Outpatient Prescriptions  Medication Sig Dispense Refill  . amitriptyline (ELAVIL) 50 MG tablet Take 50 mg by mouth 2 (two) times daily.      Marland Kitchen aspirin 81 MG tablet Take 81 mg by mouth daily.      Marland Kitchen atorvastatin (LIPITOR) 40 MG tablet Take 40 mg by mouth daily.      . cetirizine (ZYRTEC) 10 MG tablet Take 10 mg by mouth daily.      . ciprofloxacin (CIPRO) 500 MG tablet Take 500 mg by mouth 2  (two) times daily. As needed      . estradiol (ESTRACE) 1 MG tablet Take 1 tablet (1 mg total) by mouth daily.  90 tablet  4  . Multiple Vitamin (MULTIVITAMIN) tablet Take 1 tablet by mouth daily.      . Pantoprazole Sodium (PROTONIX PO) Take by mouth.      . Pravastatin Sodium (PRAVACHOL PO) Take by mouth.      . SUMAtriptan (IMITREX) 100 MG tablet TAKE 1 TABLET AT ONSET OF MIGRAINE. MAY REPEAT ONCE IN 2 HOURS. MAX 2/24 HOURS  27 tablet  5  . Vitamin D, Ergocalciferol, (DRISDOL) 50000 UNITS CAPS TAKE 1 CAPSULE WEEKLY. **NEED YEARLY CHECK UP NOW :) **  4 capsule  0   No current facility-administered medications for this visit.    Allergies as of 08/11/2013 - Review Complete 08/11/2013  Allergen Reaction Noted  . Codeine Nausea And Vomiting 04/30/2012  . Penicillins  04/30/2012    Vitals: BP 114/72  Pulse 64  Resp 16  Ht 5\' 3"  (1.6 m)  Wt 154 lb (69.854 kg)  BMI 27.29 kg/m2 Last Weight:  Wt Readings from Last 1 Encounters:  08/11/13 154 lb (69.854 kg)   Last Height:   Ht Readings from Last 1 Encounters:  08/11/13 5\' 3"  (1.6 m)    Physical exam:  General: The patient is awake, alert and appears not in acute distress. The patient is well groomed. Head: Normocephalic, atraumatic. Neck is supple.no goiter ,  Mallampati 2 , neck circumference:13.75 Cardiovascular:  Regular rate and rhythm, without  murmurs or carotid bruit, and without distended neck veins. Respiratory: Lungs are clear to auscultation. Skin:  Without evidence of edema, or rash Trunk: BMI is normal .  Neurologic exam : The patient is awake and alert, oriented to place and time.  Memory subjective  described as intact. There is a normal attention span & concentration ability.  Speech is fluent without  dysarthria, dysphonia or aphasia. Mood and affect are appropriate.  Cranial nerves: Pupils are equal and briskly reactive to light. Funduscopic exam without  evidence of pallor or edema. Extraocular movements  in  vertical and horizontal planes intact and without nystagmus. Visual fields by finger perimetry are intact. Hearing to finger rub intact.  Facial sensation intact to fine touch. Facial motor strength is symmetric and tongue and uvula move  midline.  Motor exam:   Normal tone and normal muscle bulk and symmetric normal strength in all extremities. Sensory:  Fine touch, pinprick and vibration were tested in all extremities. Proprioception is normal. Coordination: Rapid alternating movements in the fingers/hands is tested and normal. Finger-to-nose maneuver without evidence of ataxia, dysmetria or tremor. Gait and station: Patient walks without assistive device. Strength within normal limits. Stance is stable and normal.   Deep tendon reflexes: in the  upper and lower extremities are symmetric and intact.  Babinski maneuver downgoing.   Assessment:  After physical and neurologic examination, review of laboratory studies, imaging, neurophysiology testing and pre-existing records. 1) chronic mild Insomnia. Menopausal onset of worsening. 50mg  Amitrityline nightly since 2005.  2)  Snoring, but not convinced she has apnea.  Status post tonsillectomy.  Elongated uvula, not a high grade mallompati. Snoring, dry mouth.  3) not obese -and physically active.  Plan ; screening for OSA at home in a HST,  And if positive for apnea , will  follow with titration.  Refill medications.

## 2013-08-13 NOTE — Telephone Encounter (Signed)
Papers faxed to dr. Thea Gist office pt. Called and informed of actions

## 2013-08-19 ENCOUNTER — Telehealth: Payer: Self-pay | Admitting: Cardiovascular Disease

## 2013-08-19 NOTE — Telephone Encounter (Signed)
Pt. Called and lab slip mailed to her .

## 2013-08-19 NOTE — Telephone Encounter (Signed)
She need a lab order,please send it downstairs.Please call and let her know when you do this.

## 2013-08-19 NOTE — Telephone Encounter (Signed)
Message forwarded to [facility]. Berlinda Last, LPN.  Chart# [id number] on desk w/ this note.

## 2013-08-29 ENCOUNTER — Telehealth: Payer: Self-pay | Admitting: *Deleted

## 2013-08-29 ENCOUNTER — Ambulatory Visit: Payer: BC Managed Care – PPO | Admitting: *Deleted

## 2013-08-29 DIAGNOSIS — R0683 Snoring: Secondary | ICD-10-CM

## 2013-08-29 DIAGNOSIS — G4700 Insomnia, unspecified: Secondary | ICD-10-CM

## 2013-08-29 NOTE — Telephone Encounter (Signed)
Patient arrives for HST instruction and setup however after some discussion, we decide it is best to move forward with overnight oximetry instead.  This patient has a long history of insomnia which is well treated with Amitriptyline.  She does report the following: - rare snoring - some awakenings during the night but with easy return to sleep (was much worse prior to medication) - migraine headaches - mild degree of tiredness/sleepiness if no 2 PM coffee but nothing severe.  She reports if she doesn't have her ritual PM coffee which is her last caffeine of the day, she may feel sluggish and benefit from a 10 minute power nap. - family history of cardiac issues which she is followed by a cardiologist for prophylactically. - sleep concerns which worsened during menopause, but are much improved on Amitriptyline.  She denies or is negative for the following: - EDS - BMI is less 27, much less than standard requirement of 35 - witnessed apneas - habitual snoring - non-restorative sleep  I talked with Dr. Vickey Huger and we agreed that it is best to proceed with an overnight oximetry on room air and if this shows abnormalities than further testing should be considered necessary.  At this time, patient doesn't meet the high pre-test probability for OSA which is required by her insurer.  Patient agrees with this plan and understands that overnight oximetry results showing areas of concern will be followed up upon.  HST appt was cancelled and orders for ONO were forwarded to [facility] (pt was given their brochure)

## 2013-08-29 NOTE — Sleep Study (Signed)
ERROR

## 2013-09-08 ENCOUNTER — Other Ambulatory Visit: Payer: Self-pay | Admitting: Cardiovascular Disease

## 2013-09-08 LAB — LIPID PANEL
Cholesterol: 149 mg/dL (ref 0–200)
HDL: 56 mg/dL (ref >39)
LDL Cholesterol: 73 mg/dL (ref 0–99)
Triglycerides: 100 mg/dL (ref <150)
VLDL: 20 mg/dL (ref 0–40)

## 2013-09-08 LAB — COMPREHENSIVE METABOLIC PANEL
Albumin: 4.2 g/dL (ref 3.5–5.2)
Alkaline Phosphatase: 57 U/L (ref 39–117)
BUN: 15 mg/dL (ref 6–23)
Calcium: 9.3 mg/dL (ref 8.4–10.5)
Chloride: 106 mEq/L (ref 96–112)
Creat: 0.86 mg/dL (ref 0.50–1.10)
Glucose, Bld: 94 mg/dL (ref 70–99)
Total Bilirubin: 0.8 mg/dL (ref 0.3–1.2)
Total Protein: 6.4 g/dL (ref 6.0–8.3)

## 2013-09-26 NOTE — Telephone Encounter (Signed)
Received this notification from [facility] on 09/26/13:  FYI we have spoken to this pt in ref to ONO. She said she would call back. We have not heard anything back and have left 2 additional messages   Thanks  Jerome

## 2013-10-09 ENCOUNTER — Other Ambulatory Visit: Payer: Self-pay | Admitting: *Deleted

## 2013-10-28 ENCOUNTER — Telehealth: Payer: Self-pay | Admitting: Neurology

## 2013-10-28 NOTE — Telephone Encounter (Signed)
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 CD

## 2013-10-29 ENCOUNTER — Other Ambulatory Visit: Payer: Self-pay | Admitting: Neurology

## 2013-10-29 DIAGNOSIS — R0683 Snoring: Secondary | ICD-10-CM

## 2013-10-29 DIAGNOSIS — F5102 Adjustment insomnia: Secondary | ICD-10-CM

## 2013-11-04 ENCOUNTER — Other Ambulatory Visit: Payer: Self-pay | Admitting: *Deleted

## 2013-11-04 MED ORDER — ROSUVASTATIN CALCIUM 10 MG PO TABS: 10.0000 mg | ORAL_TABLET | Freq: Every day | ORAL | Status: DC

## 2013-11-04 MED ORDER — VITAMIN D (ERGOCALCIFEROL) 1.25 MG (50000 UNIT) PO CAPS: 50000.0000 [IU] | ORAL_CAPSULE | ORAL | Status: DC

## 2013-11-05 ENCOUNTER — Other Ambulatory Visit: Payer: Self-pay

## 2013-11-05 MED ORDER — ROSUVASTATIN CALCIUM 10 MG PO TABS: 10.0000 mg | ORAL_TABLET | Freq: Every day | ORAL | Status: DC

## 2013-11-05 NOTE — Telephone Encounter (Signed)
Rx was sent to pharmacy electronically. 

## 2013-12-01 ENCOUNTER — Other Ambulatory Visit: Payer: Self-pay

## 2013-12-01 DIAGNOSIS — Z7901 Long term (current) use of anticoagulants: Secondary | ICD-10-CM

## 2013-12-01 DIAGNOSIS — E785 Hyperlipidemia, unspecified: Secondary | ICD-10-CM

## 2013-12-11 ENCOUNTER — Encounter (INDEPENDENT_AMBULATORY_CARE_PROVIDER_SITE_OTHER): Payer: Self-pay

## 2013-12-11 ENCOUNTER — Ambulatory Visit (INDEPENDENT_AMBULATORY_CARE_PROVIDER_SITE_OTHER): Payer: BC Managed Care – PPO | Admitting: Internal Medicine

## 2013-12-11 ENCOUNTER — Encounter: Payer: Self-pay | Admitting: Internal Medicine

## 2013-12-11 VITALS — BP 130/82 | HR 54 | Ht 63.0 in | Wt 157.0 lb

## 2013-12-11 DIAGNOSIS — Z79899 Other long term (current) drug therapy: Secondary | ICD-10-CM

## 2013-12-11 DIAGNOSIS — Z0000 Encounter for general adult medical examination without abnormal findings: Secondary | ICD-10-CM

## 2013-12-11 DIAGNOSIS — E785 Hyperlipidemia, unspecified: Secondary | ICD-10-CM

## 2013-12-11 NOTE — Progress Notes (Signed)
HPI Patinet is a 63 yo who was previously followed by Marella Chimes.  Last seen in May of 2014. Hx of Mild DJD, migrains, GERD, mild elevation of LFTs.  (has continued lipitor) She has a strong family history of CAD and dyslipdemia.  Last lipids in May 2014 LDL was 88, HDL ws 51.  SBOT and SGPT were 39 and 50 respectively  She stopped ETOH for 2 mon . Minor elevation persisted.  Stopped statin and LFTs returned to normal.  She started back on lipitor and labs in October LDL was 73, HDL of 56.  SBPT 42  Normal SGOT.    She was switched to Crestor.    Myoview in 2010  Mild breast attenuation  No scar/ischemia LVEF 61%.  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  Yoga on Sundays  Golfs    Allergies  Allergen Reactions  . Codeine Nausea And Vomiting  . Penicillins     Rash,itching    Current Outpatient Prescriptions  Medication Sig Dispense Refill  . amitriptyline (ELAVIL) 50 MG tablet Take 50 mg by mouth at bedtime.      Marland Kitchen aspirin 81 MG tablet Take 81 mg by mouth daily.      . cetirizine (ZYRTEC) 10 MG tablet Take 10 mg by mouth daily.      Marland Kitchen estradiol (ESTRACE) 1 MG tablet Take 1 tablet (1 mg total) by mouth daily.  90 tablet  4  . Multiple Vitamin (MULTIVITAMIN) tablet Take 1 tablet by mouth daily.      . Pantoprazole Sodium (PROTONIX PO) Take by mouth.      . rosuvastatin (CRESTOR) 10 MG tablet Take 1 tablet (10 mg total) by mouth daily.  90 tablet  1  . SUMAtriptan (IMITREX) 100 MG tablet Take 1 tablet (100 mg total) by mouth as directed. May repeat in 2 hours if headache persists or recurs.  26 tablet  3  . Vitamin D, Ergocalciferol, (DRISDOL) 50000 UNITS CAPS capsule Take 1 capsule (50,000 Units total) by mouth every 7 (seven) days.  4 capsule  0  . ZOSTAVAX 06269 UNT/0.65ML injection        No current facility-administered medications for this visit.     Past Medical History  Diagnosis Date  . Osteopenia   . Fibroid   . Adenomyosis   . Uterine prolapse   . Urinary incontinence   . Cystocele   . Rectocele   . Menopausal symptoms   . Migraine   . Asthma   . Asthma   . High cholesterol   . Migraine     Past Surgical History  Procedure Laterality Date  . Appendectomy    . Vaginal hysterectomy  2011    Vag. Hyst BSO A/P repair-Sling  . Tonsillectomy    . Nasal sinus surgery    . Back surgery  1999    L4-L5  . Colonoscopy  10/2011    Family History  Problem Relation Age of Onset  . Diabetes Mother   . Leukemia Mother   . Cancer Mother     leukemia  . Heart disease Father   . Retinitis pigmentosa Father   . Migraines Father     History   Social History  . Marital Status: Married    Spouse Name: [name]    Number of Children: 2  .  Years of Education: 16   Occupational History  .      [employer]   Social History Main Topics  . Smoking status: Former Smoker    Quit date: 05/08/1972  . Smokeless tobacco: Never Used  . Alcohol Use: Yes     Comment: occas. wine(twice weekly)  . Drug Use: No  . Sexual Activity: Yes    Birth Control/ Protection: Surgical   Other Topics Concern  . Not on file   Social History Narrative  . No narrative on file    Review of Systems:  All systems reviewed.  They are negative to the above problem except as previously stated.  Vital Signs: BP 130/82  Pulse 54  Ht 5\' 3"  (1.6 m)  Wt 157 lb (71.215 kg)  BMI 27.82 kg/m2  Physical Exam Patient is in NAD HEENT:  Normocephalic, atraumatic. EOMI, PERRLA.  Neck: JVP is normal.  No bruits.  Lungs: clear to auscultation. No rales no wheezes.  Heart: Regular rate and rhythm. Normal S1, S2. No S3.   No significant murmurs. PMI not displaced.  Abdomen:  Supple, nontender. Normal bowel sounds. No masses. No hepatomegaly.  Extremities:   Good distal pulses throughout. No lower extremity edema.  Musculoskeletal :moving all  extremities.  Neuro:   alert and oriented x3.  CN II-XII grossly intact.  EKG  SB  54 bpm   Assessment and Plan:  Patinet with no known CAD  Has had lipids managed aggressively given Fhx.  WIll check fasting panel on Crestor as well as LFTs Check CBC, BmET and TSH F/U in 1 year.

## 2013-12-11 NOTE — Patient Instructions (Signed)
Your physician recommends that you return FASTING LAB WORK: LIPIDS, LIVER PANEL, TSH, CMET, CBC   Your physician wants you to follow-up in: [place]. You will receive a reminder letter in the mail two months in advance. If you don't receive a letter, please call our office to schedule the follow-up appointment.

## 2013-12-24 ENCOUNTER — Other Ambulatory Visit: Payer: Self-pay | Admitting: Obstetrics and Gynecology

## 2013-12-31 ENCOUNTER — Other Ambulatory Visit: Payer: Self-pay | Admitting: Internal Medicine

## 2013-12-31 LAB — CBC
HEMATOCRIT: 43.1 % (ref 36.0–46.0)
Hemoglobin: 14.4 g/dL (ref 12.0–15.0)
MCH: 29.4 pg (ref 26.0–34.0)
MCHC: 33.4 g/dL (ref 30.0–36.0)
MCV: 88 fL (ref 78.0–100.0)
Platelets: 234 10*3/uL (ref 150–400)
RBC: 4.9 MIL/uL (ref 3.87–5.11)
RDW: 14.2 % (ref 11.5–15.5)
WBC: 6.1 10*3/uL (ref 4.0–10.5)

## 2014-01-01 LAB — COMPREHENSIVE METABOLIC PANEL
ALT: 36 U/L — ABNORMAL HIGH (ref 0–35)
AST: 32 U/L (ref 0–37)
Albumin: 4.6 g/dL (ref 3.5–5.2)
Alkaline Phosphatase: 53 U/L (ref 39–117)
BILIRUBIN TOTAL: 1.1 mg/dL (ref 0.2–1.2)
BUN: 21 mg/dL (ref 6–23)
CALCIUM: 9.3 mg/dL (ref 8.4–10.5)
CO2: 28 meq/L (ref 19–32)
CREATININE: 0.96 mg/dL (ref 0.50–1.10)
Chloride: 105 mEq/L (ref 96–112)
Glucose, Bld: 86 mg/dL (ref 70–99)
Potassium: 4.3 mEq/L (ref 3.5–5.3)
Sodium: 139 mEq/L (ref 135–145)
Total Protein: 6.8 g/dL (ref 6.0–8.3)

## 2014-01-01 LAB — HEPATIC FUNCTION PANEL
ALT: 36 U/L — AB (ref 0–35)
AST: 32 U/L (ref 0–37)
Albumin: 4.6 g/dL (ref 3.5–5.2)
Alkaline Phosphatase: 53 U/L (ref 39–117)
Bilirubin, Direct: 0.2 mg/dL (ref 0.0–0.3)
Indirect Bilirubin: 0.9 mg/dL (ref 0.2–1.2)
TOTAL PROTEIN: 6.8 g/dL (ref 6.0–8.3)
Total Bilirubin: 1.1 mg/dL (ref 0.2–1.2)

## 2014-01-01 LAB — LIPID PANEL
Cholesterol: 144 mg/dL (ref 0–200)
HDL: 59 mg/dL (ref >39)
LDL Cholesterol: 68 mg/dL (ref 0–99)
TRIGLYCERIDES: 84 mg/dL (ref <150)
Total CHOL/HDL Ratio: 2.4 Ratio
VLDL: 17 mg/dL (ref 0–40)

## 2014-01-01 LAB — TSH: TSH: 4.808 u[IU]/mL — AB (ref 0.350–4.500)

## 2014-01-09 ENCOUNTER — Other Ambulatory Visit: Payer: Self-pay | Admitting: *Deleted

## 2014-01-09 ENCOUNTER — Telehealth: Payer: Self-pay | Admitting: Internal Medicine

## 2014-01-09 DIAGNOSIS — E039 Hypothyroidism, unspecified: Secondary | ICD-10-CM

## 2014-01-09 NOTE — Telephone Encounter (Signed)
Labs reviewed with patient, she will have further thyroid labs drawn in two weeks at [facility] as she will be out of town next week.  Copy of results printed and put in mail to patient along with script for free T3, free T4 per her request.

## 2014-01-09 NOTE — Telephone Encounter (Signed)
New message ° ° °Patient calling regarding test results.   °

## 2014-01-19 ENCOUNTER — Other Ambulatory Visit: Payer: Self-pay | Admitting: Internal Medicine

## 2014-01-20 LAB — T4, FREE: FREE T4: 0.99 ng/dL (ref 0.80–1.80)

## 2014-01-20 LAB — T3, FREE: T3, Free: 2.4 pg/mL (ref 2.3–4.2)

## 2014-04-16 ENCOUNTER — Other Ambulatory Visit: Payer: Self-pay | Admitting: Neurology

## 2014-07-05 ENCOUNTER — Other Ambulatory Visit: Payer: Self-pay | Admitting: Internal Medicine

## 2014-07-06 NOTE — Telephone Encounter (Signed)
OK to refill

## 2014-07-07 ENCOUNTER — Other Ambulatory Visit: Payer: Self-pay

## 2014-07-07 MED ORDER — VITAMIN D (ERGOCALCIFEROL) 1.25 MG (50000 UNIT) PO CAPS: 50000.0000 [IU] | ORAL_CAPSULE | ORAL | Status: DC

## 2014-08-12 ENCOUNTER — Ambulatory Visit: Payer: BC Managed Care – PPO | Admitting: Neurology

## 2014-08-14 ENCOUNTER — Ambulatory Visit: Payer: BC Managed Care – PPO | Admitting: Neurology

## 2014-09-21 ENCOUNTER — Encounter: Payer: Self-pay | Admitting: Internal Medicine

## 2014-09-23 ENCOUNTER — Ambulatory Visit: Payer: BC Managed Care – PPO | Admitting: Neurology

## 2014-10-19 ENCOUNTER — Encounter: Payer: Self-pay | Admitting: Neurology

## 2014-10-19 ENCOUNTER — Ambulatory Visit (INDEPENDENT_AMBULATORY_CARE_PROVIDER_SITE_OTHER): Payer: Medicare Other | Admitting: Neurology

## 2014-10-19 VITALS — BP 122/65 | HR 59 | Resp 14 | Ht 63.0 in | Wt 152.0 lb

## 2014-10-19 DIAGNOSIS — G4700 Insomnia, unspecified: Secondary | ICD-10-CM

## 2014-10-19 DIAGNOSIS — R0683 Snoring: Secondary | ICD-10-CM

## 2014-10-19 MED ORDER — AMITRIPTYLINE HCL 50 MG PO TABS: ORAL_TABLET | ORAL | Status: DC

## 2014-10-19 NOTE — Patient Instructions (Signed)
Insomnia Insomnia is frequent trouble falling and/or staying asleep. Insomnia can be a long term problem or a short term problem. Both are common. Insomnia can be a short term problem when the wakefulness is related to a certain stress or worry. Long term insomnia is often related to ongoing stress during waking hours and/or poor sleeping habits. Overtime, sleep deprivation itself can make the problem worse. Every little thing feels more severe because you are overtired and your ability to cope is decreased. CAUSES   Stress, anxiety, and depression.  Poor sleeping habits.  Distractions such as TV in the bedroom.  Naps close to bedtime.  Engaging in emotionally charged conversations before bed.  Technical reading before sleep.  Alcohol and other sedatives. They may make the problem worse. They can hurt normal sleep patterns and normal dream activity.  Stimulants such as caffeine for several hours prior to bedtime.  Pain syndromes and shortness of breath can cause insomnia.  Exercise late at night.  Changing time zones may cause sleeping problems (jet lag). It is sometimes helpful to have someone observe your sleeping patterns. They should look for periods of not breathing during the night (sleep apnea). They should also look to see how long those periods last. If you live alone or observers are uncertain, you can also be observed at a sleep clinic where your sleep patterns will be professionally monitored. Sleep apnea requires a checkup and treatment. Give your caregivers your medical history. Give your caregivers observations your family has made about your sleep.  SYMPTOMS   Not feeling rested in the morning.  Anxiety and restlessness at bedtime.  Difficulty falling and staying asleep. TREATMENT   Your caregiver may prescribe treatment for an underlying medical disorders. Your caregiver can give advice or help if you are using alcohol or other drugs for self-medication. Treatment  of underlying problems will usually eliminate insomnia problems.  Medications can be prescribed for short time use. They are generally not recommended for lengthy use.  Over-the-counter sleep medicines are not recommended for lengthy use. They can be habit forming.  You can promote easier sleeping by making lifestyle changes such as:  Using relaxation techniques that help with breathing and reduce muscle tension.  Exercising earlier in the day.  Changing your diet and the time of your last meal. No night time snacks.  Establish a regular time to go to bed.  Counseling can help with stressful problems and worry.  Soothing music and white noise may be helpful if there are background noises you cannot remove.  Stop tedious detailed work at least one hour before bedtime. HOME CARE INSTRUCTIONS   Keep a diary. Inform your caregiver about your progress. This includes any medication side effects. See your caregiver regularly. Take note of:  Times when you are asleep.  Times when you are awake during the night.  The quality of your sleep.  How you feel the next day. This information will help your caregiver care for you.  Get out of bed if you are still awake after 15 minutes. Read or do some quiet activity. Keep the lights down. Wait until you feel sleepy and go back to bed.  Keep regular sleeping and waking hours. Avoid naps.  Exercise regularly.  Avoid distractions at bedtime. Distractions include watching television or engaging in any intense or detailed activity like attempting to balance the household checkbook.  Develop a bedtime ritual. Keep a familiar routine of bathing, brushing your teeth, climbing into bed at the same   time each night, listening to soothing music. Routines increase the success of falling to sleep faster.  Use relaxation techniques. This can be using breathing and muscle tension release routines. It can also include visualizing peaceful scenes. You can  also help control troubling or intruding thoughts by keeping your mind occupied with boring or repetitive thoughts like the old concept of counting sheep. You can make it more creative like imagining planting one beautiful flower after another in your backyard garden.  During your day, work to eliminate stress. When this is not possible use some of the previous suggestions to help reduce the anxiety that accompanies stressful situations. MAKE SURE YOU:   Understand these instructions.  Will watch your condition.  Will get help right away if you are not doing well or get worse. Document Released: 11/03/2000 Document Revised: 01/29/2012 Document Reviewed: 12/04/2007 Exit[employer] Patient Information 2015 [employer]. This information is not intended to replace advice given to you by your health care provider. Make sure you discuss any questions you have with your health care provider.  

## 2014-10-19 NOTE — Progress Notes (Signed)
[facility]  Provider:  Larey Seat, M D  Referring Provider: No ref. provider found Primary Care Physician:  Dr. Terance Ice, MD, [place] and [place].   Chief Complaint  Patient presents with  . New Evaluation    sleep disorder,[place],rm 10    HPI:  Courtney Horne is a 63 y.o. female  Is seen here as a referral/ revisit  from Dr. Rollene Fare for a migraine and  insomnia evaluation.   The patient had for many years some insomnia, would wake up in the midddle of the night and could fall back asleep  She developed sinuitis and allergies, and the lack of sleep resulted in increasing Sinus and Migraine headaches.  Sleep habits finally changed with menopause around [age] and she had increasing difficulties to fall asleep.  Once her allergy specialist and her ENT declared her "cleared "  By her mid-fourties , she was referred to Dr Fernanda Drum and diagnosed with migraines, treated successfully with Imitrex.  Dr. Sudie Bailey had been her Imitrex and Amitriptyline pre-scriber and her headaches and sleep were again well controlled for many years.  [name] has no shift work history, no history of excessive sleepiness.   Her work hours  Are  7 Am to 12 noon, just started part time from full time  a couple of month ago. There is some sleepiness after Lunch , but napping a few days a week.  She likes  one cup of coffee  in AM and one in the afternoon at 3 PM. She is  not a Soda, not a Tea trinker ,  but drinks water .  Bedtimes are : 10.30 lights out, asleep within 30 minutes or less, one bathroom break, snoring rarely- no observed apneas.    Not sleepy as a child, not sleep walking or sleep talking, and she has not been suffering from night terrors. She dreams,but  only occasionally recalls her dreams.  She sometimes wakes up with headaches, not from headaches, she sees a correlation to dehydration.   ETOH may provoke headaches.  Weather changes will bring on  headache. FSS 14, GDS 0 , Epworth 6 . 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    Interval history 10-19-14 : Patient with chronic insomnia, migraines and intermittent snoring, still has not had her HST, after multiple attempts to contact her. Here for refills .    Review of Systems: Out of a complete 14 system review, the patient complains of only the following symptoms, and all other reviewed systems are negative. See above - Epworth 3, FSS 12.   History   Social History  . Marital Status: Married    Spouse Name: Courtney Horne    Number of Children: 2  . Years of Education: 16   Occupational History  .      [employer]   Social History Main Topics  . Smoking status: Former Smoker    Quit date: 05/08/1972  . Smokeless tobacco: Never Used  . Alcohol Use: Yes     Comment: occas. wine(twice weekly)  . Drug Use: No  . Sexual Activity: Yes    Birth Control/ Protection: Surgical   Other Topics Concern  . Not on file   Social History Narrative  . No narrative on file    Family History  Problem Relation Age of Onset  . Diabetes Mother   . Leukemia Mother   . Cancer Mother     leukemia  . Heart disease Father   . Retinitis pigmentosa Father   .  Migraines Father     Past Medical History  Diagnosis Date  . Osteopenia   . Fibroid   . Adenomyosis   . Uterine prolapse   . Urinary incontinence   . Cystocele   . Rectocele   . Menopausal symptoms   . Migraine   . Asthma   . Asthma   . High cholesterol   . Migraine     Past Surgical History  Procedure Laterality Date  . Appendectomy    . Vaginal hysterectomy  2011    Vag. Hyst BSO A/P repair-Sling  . Tonsillectomy    . Nasal sinus surgery    . Back surgery  1999    L4-L5  . Colonoscopy  10/2011    Current Outpatient Prescriptions  Medication Sig Dispense Refill  . amitriptyline (ELAVIL) 50 MG tablet Take 50 mg by mouth 2 (two) times daily.      Marland Kitchen aspirin 81 MG tablet Take 81 mg by mouth daily.      Marland Kitchen atorvastatin  (LIPITOR) 40 MG tablet Take 40 mg by mouth daily.      . cetirizine (ZYRTEC) 10 MG tablet Take 10 mg by mouth daily.      . ciprofloxacin (CIPRO) 500 MG tablet Take 500 mg by mouth 2 (two) times daily. As needed      . estradiol (ESTRACE) 1 MG tablet Take 1 tablet (1 mg total) by mouth daily.  90 tablet  4  . Multiple Vitamin (MULTIVITAMIN) tablet Take 1 tablet by mouth daily.      . Pantoprazole Sodium (PROTONIX PO) Take by mouth.      . Pravastatin Sodium (PRAVACHOL PO) Take by mouth.      . SUMAtriptan (IMITREX) 100 MG tablet TAKE 1 TABLET AT ONSET OF MIGRAINE. MAY REPEAT ONCE IN 2 HOURS. MAX 2/24 HOURS  27 tablet  5  . Vitamin D, Ergocalciferol, (DRISDOL) 50000 UNITS CAPS TAKE 1 CAPSULE WEEKLY. **NEED YEARLY CHECK UP NOW :) **  4 capsule  0   No current facility-administered medications for this visit.    Allergies as of 08/11/2013 - Review Complete 08/11/2013  Allergen Reaction Noted  . Codeine Nausea And Vomiting 04/30/2012  . Penicillins  04/30/2012    Vitals: BP 114/72  Pulse 64  Resp 16  Ht 5\' 3"  (1.6 m)  Wt 154 lb (69.854 kg)  BMI 27.29 kg/m2 Last Weight:  Wt Readings from Last 1 Encounters:  08/11/13 154 lb (69.854 kg)   Last Height:   Ht Readings from Last 1 Encounters:  08/11/13 5\' 3"  (1.6 m)    Physical exam:  General: The patient is awake, alert and appears not in acute distress. The patient is well groomed. Head: Normocephalic, atraumatic. Neck is supple.no goiter ,  Mallampati 2 , neck circumference:13.75 Cardiovascular:  Regular rate and rhythm, without  murmurs or carotid bruit, and without distended neck veins. Respiratory: Lungs are clear to auscultation. Skin:  Without evidence of edema, or rash Trunk: BMI is normal .  Neurologic exam : The patient is awake and alert, oriented to place and time.  Memory subjective  described as intact. There is a normal attention span & concentration ability.  Speech is fluent without  dysarthria, dysphonia or  aphasia. Mood and affect are appropriate.  Cranial nerves: Pupils are equal and briskly reactive to light. Funduscopic exam without  evidence of pallor or edema. Extraocular movements  in vertical and horizontal planes intact and without nystagmus. Visual fields by finger perimetry are intact.  Hearing to finger rub intact.  Facial sensation intact to fine touch. Facial motor strength is symmetric and tongue and uvula move midline. Deep tendon reflexes: in the  upper and lower extremities are symmetric and intact.  Babinski maneuver downgoing.   Assessment:  After physical and neurologic examination, review of laboratory studies, imaging, neurophysiology testing and pre-existing records. 1) chronic mild Insomnia. Menopausal onset of worsening.  50 mg Amitrityline nightly since 2005.  2)  Snoring, but not convinced she has apnea.  Status post tonsillectomy.  Elongated uvula, not a high grade mallompati. Snoring, dry mouth.  3) not obese -and physically active.  Plan  screening for OSA at home was not performed, her insurance ( now medicare ) was not providing coverage.   ONO ordered.  Refill medications. Amitriptyline.

## 2014-10-22 ENCOUNTER — Other Ambulatory Visit: Payer: Self-pay | Admitting: Neurology

## 2014-10-22 NOTE — Telephone Encounter (Signed)
Last prescribed at [facility] on in Sept

## 2014-11-05 ENCOUNTER — Other Ambulatory Visit: Payer: Self-pay | Admitting: Internal Medicine

## 2014-11-09 ENCOUNTER — Other Ambulatory Visit: Payer: Self-pay | Admitting: Cardiology

## 2014-11-19 ENCOUNTER — Encounter: Payer: Self-pay | Admitting: Internal Medicine

## 2014-11-20 DIAGNOSIS — J45901 Unspecified asthma with (acute) exacerbation: Secondary | ICD-10-CM | POA: Diagnosis not present

## 2014-11-25 ENCOUNTER — Telehealth: Payer: Self-pay | Admitting: Internal Medicine

## 2014-11-25 DIAGNOSIS — Z0000 Encounter for general adult medical examination without abnormal findings: Secondary | ICD-10-CM

## 2014-11-25 NOTE — Telephone Encounter (Signed)
New problem   Pt want to know if she need any labs b/f her appt. Please advise pt.

## 2014-12-01 DIAGNOSIS — K219 Gastro-esophageal reflux disease without esophagitis: Secondary | ICD-10-CM | POA: Diagnosis not present

## 2014-12-02 NOTE — Telephone Encounter (Signed)
Staff message sent to Dr. Harrington Challenger for lab orders.

## 2014-12-09 NOTE — Telephone Encounter (Signed)
Fay Records, MD  Rodman Key, RN           Yes Set up for labs        Previous Messages     ----- Message -----   From: Rodman Key, RN   Sent: 12/02/2014  3:44 PM    To: Fay Records, MD  Subject: labs                       Patient coming for 1 year f/u on March 7. Would like to know if she should have labs prior to appointment.   (last year she had lipids/liver/bmet, cbc)   Courtney Horne        Placed orders for labs. Called patient to inform and to schedule appointment.   Left message for patient to call back.

## 2014-12-10 NOTE — Telephone Encounter (Signed)
Follow up    Patient calling stating Dr. Harrington Challenger called her  -  Returning the  call back

## 2014-12-10 NOTE — Telephone Encounter (Signed)
Returned pt's phone call.  She wanted to verify that she needed labs prior to coming in for her appt on 01/25/15.  Orders were already placed for these labs.  Scheduled appt for 01/18/15.  Pt grateful for return phone call.

## 2015-01-11 DIAGNOSIS — J029 Acute pharyngitis, unspecified: Secondary | ICD-10-CM | POA: Diagnosis not present

## 2015-01-18 ENCOUNTER — Other Ambulatory Visit (INDEPENDENT_AMBULATORY_CARE_PROVIDER_SITE_OTHER): Payer: Medicare Other | Admitting: *Deleted

## 2015-01-18 DIAGNOSIS — Z0000 Encounter for general adult medical examination without abnormal findings: Secondary | ICD-10-CM | POA: Diagnosis not present

## 2015-01-18 LAB — HEPATIC FUNCTION PANEL
ALK PHOS: 61 U/L (ref 39–117)
ALT: 24 U/L (ref 0–35)
AST: 24 U/L (ref 0–37)
Albumin: 4.1 g/dL (ref 3.5–5.2)
Bilirubin, Direct: 0.1 mg/dL (ref 0.0–0.3)
TOTAL PROTEIN: 7 g/dL (ref 6.0–8.3)
Total Bilirubin: 0.6 mg/dL (ref 0.2–1.2)

## 2015-01-18 LAB — BASIC METABOLIC PANEL
BUN: 14 mg/dL (ref 6–23)
CO2: 31 meq/L (ref 19–32)
Calcium: 9.5 mg/dL (ref 8.4–10.5)
Chloride: 104 mEq/L (ref 96–112)
Creatinine, Ser: 0.85 mg/dL (ref 0.40–1.20)
GFR: 71.13 mL/min (ref >60.00)
Glucose, Bld: 98 mg/dL (ref 70–99)
Potassium: 3.8 mEq/L (ref 3.5–5.1)
SODIUM: 138 meq/L (ref 135–145)

## 2015-01-18 LAB — LIPID PANEL
CHOL/HDL RATIO: 3
Cholesterol: 159 mg/dL (ref 0–200)
HDL: 46.1 mg/dL (ref >39.00)
LDL CALC: 89 mg/dL (ref 0–99)
NONHDL: 112.9
Triglycerides: 120 mg/dL (ref 0.0–149.0)
VLDL: 24 mg/dL (ref 0.0–40.0)

## 2015-01-18 LAB — CBC
HCT: 43.9 % (ref 36.0–46.0)
HEMOGLOBIN: 14.4 g/dL (ref 12.0–15.0)
MCHC: 32.7 g/dL (ref 30.0–36.0)
MCV: 87.7 fl (ref 78.0–100.0)
PLATELETS: 210 10*3/uL (ref 150.0–400.0)
RBC: 5.01 Mil/uL (ref 3.87–5.11)
RDW: 14.1 % (ref 11.5–15.5)
WBC: 6.8 10*3/uL (ref 4.0–10.5)

## 2015-01-25 ENCOUNTER — Encounter: Payer: Self-pay | Admitting: Internal Medicine

## 2015-01-25 ENCOUNTER — Ambulatory Visit (INDEPENDENT_AMBULATORY_CARE_PROVIDER_SITE_OTHER): Payer: Medicare Other | Admitting: Internal Medicine

## 2015-01-25 VITALS — BP 118/80 | HR 59 | Ht 63.0 in | Wt 154.8 lb

## 2015-01-25 DIAGNOSIS — E785 Hyperlipidemia, unspecified: Secondary | ICD-10-CM | POA: Diagnosis not present

## 2015-01-25 NOTE — Patient Instructions (Signed)
Your physician recommends that you continue on your current medications as directed. Please refer to the Current Medication list given to you today.  Your physician wants you to follow-up in: 1 year with Dr. Harrington Challenger. You will receive a reminder letter in the mail two months in advance. If you don't receive a letter, please call our office to schedule the follow-up appointment.  Your physician recommends that you return for lab work in: one year for Lipid Panel, CBC, BMET, Hepatic Panel and AST

## 2015-01-25 NOTE — Progress Notes (Signed)
Cardiology Office Note   Date:  01/25/2015   ID:  Courtney Horne, DOB [birth year], MRN [id number]  PCP:  Dorris Carnes, MD  Cardiologist:   Dorris Carnes, MD   No chief complaint on file.    History of Present Illness: Courtney Horne is a 64 y.o. female with a history of HL, mkld elevation of LFTs.  She was followed by Marella Chimes in past  Continues to f/u with J Medoff. Myoview in 2010 normal  LVEF 61%  Echo:  LVEF normal I saw her in Jan 2015 Since seen she has continued to do well  Denies CP  Breathing is OK  No dizziness       Current Outpatient Prescriptions  Medication Sig Dispense Refill  . amitriptyline (ELAVIL) 50 MG tablet One at night po 90 tablet 3  . aspirin 81 MG tablet Take 81 mg by mouth daily.    . CRESTOR 10 MG tablet TAKE 1 TABLET (10 MG TOTAL) BY MOUTH DAILY. 90 tablet 1  . estradiol (CLIMARA - DOSED IN MG/24 HR) 0.05 mg/24hr patch Place 0.05 mg onto the skin once a week.    . fexofenadine (ALLEGRA) 180 MG tablet Take 180 mg by mouth daily as needed for allergies or rhinitis.    . Influenza Vac Split High-Dose (FLUZONE HIGH-DOSE) 0.5 ML SUSY     . Multiple Vitamin (MULTIVITAMIN) tablet Take 1 tablet by mouth daily.    Marland Kitchen omeprazole (PRILOSEC) 20 MG capsule Take 20 mg by mouth 2 (two) times daily before a meal.    . Probiotic Product (PROBIOTIC DAILY PO) Take 1 tablet by mouth daily.    . SUMAtriptan (IMITREX) 100 MG tablet TAKE 1 TABLET BY MOUTH AS DIRECTED MAY REPEAT IN 2 HOURS IF HEADACHE PERSISTS OR RECURS 26 tablet 1  . Vitamin D, Ergocalciferol, (DRISDOL) 50000 UNITS CAPS capsule TAKE 1 CAPSULE WEEKLY. 12 capsule 3  . ZOSTAVAX [id number] UNT/0.65ML injection      No current facility-administered medications for this visit.    Allergies:   Codeine and Penicillins   Past Medical History  Diagnosis Date  . Osteopenia   . Fibroid   . Adenomyosis   . Uterine prolapse   . Urinary incontinence   . Cystocele   . Rectocele   . Menopausal symptoms   .  Migraine   . Asthma   . Asthma   . High cholesterol   . Migraine     Past Surgical History  Procedure Laterality Date  . Appendectomy    . Vaginal hysterectomy  2011    Vag. Hyst BSO A/P repair-Sling  . Tonsillectomy    . Nasal sinus surgery    . Back surgery  1999    L4-L5  . Colonoscopy  10/2011     Social History:  The patient  reports that she quit smoking about 42 years ago. She has never used smokeless tobacco. She reports that she drinks alcohol. She reports that she does not use illicit drugs.   Family History:  The patient's family history includes Cancer in her mother; Diabetes in her mother; Heart disease in her father; Leukemia in her mother; Migraines in her father; Retinitis pigmentosa in her father.    ROS:  Please see the history of present illness. All other systems are reviewed and  Negative to the above problem except as noted.    PHYSICAL EXAM: VS:  BP 118/80 mmHg  Pulse 59  Ht 5\' 3"  (1.6 m)  Wt  154 lb 12.8 oz (70.217 kg)  BMI 27.43 kg/m2  GEN: Well nourished, well developed, in no acute distress HEENT: normal Neck: no JVD, carotid bruits, or masses Cardiac: RRR; no murmurs, rubs, or gallops,no edema  Respiratory:  clear to auscultation bilaterally, normal work of breathing GI: soft, nontender, nondistended, + BS  No hepatomegaly  MS: no deformity Moving all extremities   Skin: warm and dry, no rash Neuro:  Strength and sensation are intact Psych: euthymic mood, full affect   EKG:  EKG is ordered today.  Sinus bradycardia  59 bpm     Lipid Panel    Component Value Date/Time   CHOL 159 01/18/2015 0835   TRIG 120.0 01/18/2015 0835   HDL 46.10 01/18/2015 0835   CHOLHDL 3 01/18/2015 0835   VLDL 24.0 01/18/2015 0835   LDLCALC 89 01/18/2015 0835      Wt Readings from Last 3 Encounters:  01/25/15 154 lb 12.8 oz (70.217 kg)  10/19/14 152 lb (68.947 kg)  12/11/13 157 lb (71.215 kg)      ASSESSMENT AND PLAN:  1.  HL  Patient's recentl  lipid panel shows an increase in LDL  She admits to dietary indiscretion recently  Husband had surgery  Eating food she usually doesn't eat  Wt up 9 lb.s  Getting back to normal diet. I would not make any changes  Continue meds  F/U in 1 year  contineu to stay active    Signed, Dorris Carnes, MD  01/25/2015 12:08 PM    [address], [place], [place]  [address] Phone: [phone]; Fax: [phone]

## 2015-03-24 DIAGNOSIS — Z6827 Body mass index (BMI) 27.0-27.9, adult: Secondary | ICD-10-CM | POA: Diagnosis not present

## 2015-03-24 DIAGNOSIS — Z01419 Encounter for gynecological examination (general) (routine) without abnormal findings: Secondary | ICD-10-CM | POA: Diagnosis not present

## 2015-03-24 DIAGNOSIS — Z1212 Encounter for screening for malignant neoplasm of rectum: Secondary | ICD-10-CM | POA: Diagnosis not present

## 2015-03-24 DIAGNOSIS — Z1231 Encounter for screening mammogram for malignant neoplasm of breast: Secondary | ICD-10-CM | POA: Diagnosis not present

## 2015-05-17 ENCOUNTER — Other Ambulatory Visit: Payer: Self-pay

## 2015-05-26 ENCOUNTER — Other Ambulatory Visit: Payer: Self-pay | Admitting: Internal Medicine

## 2015-05-26 NOTE — Telephone Encounter (Signed)
I did not place her on Vit D  Needs to discuss with Dr Towana Badger or primary MD

## 2015-05-26 NOTE — Telephone Encounter (Signed)
Ok to refill 

## 2015-05-28 ENCOUNTER — Other Ambulatory Visit: Payer: Self-pay | Admitting: *Deleted

## 2015-05-28 MED ORDER — VITAMIN D (ERGOCALCIFEROL) 1.25 MG (50000 UNIT) PO CAPS: ORAL_CAPSULE | ORAL | Status: DC

## 2015-06-02 ENCOUNTER — Other Ambulatory Visit: Payer: Self-pay | Admitting: Cardiology

## 2015-08-04 DIAGNOSIS — Z23 Encounter for immunization: Secondary | ICD-10-CM | POA: Diagnosis not present

## 2015-08-11 ENCOUNTER — Other Ambulatory Visit: Payer: Self-pay | Admitting: Neurology

## 2015-10-19 ENCOUNTER — Other Ambulatory Visit: Payer: Self-pay | Admitting: Oral Surgery

## 2015-10-19 DIAGNOSIS — D1039 Benign neoplasm of other parts of mouth: Secondary | ICD-10-CM | POA: Diagnosis not present

## 2015-10-20 ENCOUNTER — Ambulatory Visit (INDEPENDENT_AMBULATORY_CARE_PROVIDER_SITE_OTHER): Payer: Medicare Other | Admitting: Adult Health

## 2015-10-20 VITALS — BP 112/66 | HR 71 | Ht 63.0 in | Wt 158.5 lb

## 2015-10-20 DIAGNOSIS — G43009 Migraine without aura, not intractable, without status migrainosus: Secondary | ICD-10-CM

## 2015-10-20 MED ORDER — AMITRIPTYLINE HCL 50 MG PO TABS: 75.0000 mg | ORAL_TABLET | Freq: Every day | ORAL | Status: DC

## 2015-10-20 MED ORDER — SUMATRIPTAN SUCCINATE 100 MG PO TABS: 100.0000 mg | ORAL_TABLET | Freq: Once | ORAL | Status: DC

## 2015-10-20 NOTE — Patient Instructions (Signed)
Continue Amitriptyline and Imitrex If your symptoms worsen or you develop new symptoms please let us know.

## 2015-10-20 NOTE — Progress Notes (Signed)
I agree with the assessment and plan as directed by NP .The patient is known to me .   Ildefonso Keaney, MD  

## 2015-10-20 NOTE — Progress Notes (Signed)
PATIENT: Courtney Horne DOB: 04-23-1951  REASON FOR VISIT: follow up- migraine headaches, insomnia HISTORY FROM: patient  HISTORY OF PRESENT ILLNESS: Courtney Horne is a 64 year old female with a history of migraine headaches and insomnia. She returns today for follow-up visit. She continues to take amitriptyline and Imitrex. She reports that she has approximately 3 headaches a month. She states that she can typically take Imitrex and the headache resolves fairly quickly. She does state that she recently increased her amitriptyline to 75 mg and feels that is working better. In the past she has been referred for sleep study and overnight pulse oximetry but she has not had this completed. Patient states that she will wake up at night but she is able to go back to sleep fairly quickly. She denies any new neurological symptoms. She reports that she sees a cardiologist regularly and has EKGs that have been unremarkable. She denies any new medical history. She returns today for an evaluation.   HISTORY 10/19/14 [facility]): Courtney Horne is a 64 y.o. female Is seen here as a referral/ revisit from Courtney Horne for a migraine and insomnia evaluation.  The patient had for many years some insomnia, would wake up in the midddle of the night and could fall back asleep She developed sinuitis and allergies, and the lack of sleep resulted in increasing Sinus and Migraine headaches.  Sleep habits finally changed with menopause around [age] and she had increasing difficulties to fall asleep.  Once her allergy specialist and her ENT declared her "cleared " By her mid-fourties , she was referred to Courtney Horne and diagnosed with migraines, treated successfully with Imitrex. Courtney Horne had been her Imitrex and Amitriptyline pre-scriber and her headaches and sleep were again well controlled for many years.  Courtney Horne has no shift work history, no history of excessive sleepiness.  Her work  hours Are 7 Am to 12 noon, just started part time from full time a couple of month ago. There is some sleepiness after Lunch , but napping a few days a week.  She likes one cup of coffee in AM and one in the afternoon at 3 PM. She is not a Soda, not a Tea trinker , but drinks water .  Bedtimes are : 10.30 lights out, asleep within 30 minutes or less, one bathroom break, snoring rarely- no observed apneas. Not sleepy as a child, not sleep walking or sleep talking, and she has not been suffering from night terrors. She dreams,but only occasionally recalls her dreams.  She sometimes wakes up with headaches, not from headaches, she sees a correlation to dehydration.  ETOH may provoke headaches. Weather changes will bring on headache. FSS 14, GDS 0 , Epworth 6 . 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  Interval history 10-19-14 : Patient with chronic insomnia, migraines and intermittent snoring, still has not had her HST, after multiple attempts to contact her. Here for refills .   REVIEW OF SYSTEMS: Out of a complete 14 system review of symptoms, the patient complains only of the following symptoms, and all other reviewed systems are negative.  See history of present illness  ALLERGIES: Allergies  Allergen Reactions  . Codeine Nausea And Vomiting  . Penicillins Itching and Rash  . Zocor [Simvastatin]     HOME MEDICATIONS: Outpatient Prescriptions Prior to Visit  Medication Sig Dispense Refill  . amitriptyline (ELAVIL) 50 MG tablet One at night po 90 tablet 3  . aspirin 81 MG tablet Take 81 mg by  mouth daily.    . CRESTOR 10 MG tablet TAKE 1 TABLET (10 MG TOTAL) BY MOUTH DAILY. 90 tablet 1  . estradiol (CLIMARA - DOSED IN MG/24 HR) 0.05 mg/24hr patch Place 0.05 mg onto the skin once a week.    . fexofenadine (ALLEGRA) 180 MG tablet Take 180 mg by mouth daily as needed for allergies or rhinitis.    . Influenza Vac Split High-Dose (FLUZONE HIGH-DOSE) 0.5 ML SUSY     . Multiple Vitamin  (MULTIVITAMIN) tablet Take 1 tablet by mouth daily.    Marland Kitchen omeprazole (PRILOSEC) 20 MG capsule Take 20 mg by mouth 2 (two) times daily before a meal.    . Probiotic Product (PROBIOTIC DAILY PO) Take 1 tablet by mouth daily.    . SUMAtriptan (IMITREX) 100 MG tablet TAKE 1 TABLET BY MOUTH AS DIRECTED MAY REPEAT IN 2 HOURS IF HEADACHE PERSISTS OR RECURS 26 tablet 1  . Vitamin D, Ergocalciferol, (DRISDOL) 50000 UNITS CAPS capsule TAKE 1 CAPSULE WEEKLY. 12 capsule 3  . ZOSTAVAX [id number] UNT/0.65ML injection      No facility-administered medications prior to visit.    PAST MEDICAL HISTORY: Past Medical History  Diagnosis Date  . Osteopenia   . Fibroid   . Adenomyosis   . Uterine prolapse   . Urinary incontinence   . Cystocele   . Rectocele   . Menopausal symptoms   . Migraine   . Asthma   . Asthma   . High cholesterol   . Migraine     PAST SURGICAL HISTORY: Past Surgical History  Procedure Laterality Date  . Appendectomy    . Vaginal hysterectomy  2011    Vag. Hyst BSO A/P repair-Sling  . Tonsillectomy    . Nasal sinus surgery    . Back surgery  1999    L4-L5  . Colonoscopy  10/2011    FAMILY HISTORY: Family History  Problem Relation Age of Onset  . Diabetes Mother   . Leukemia Mother   . Cancer Mother     leukemia  . Heart disease Father   . Retinitis pigmentosa Father   . Migraines Father   . Healthy Sister   . Leukemia Brother   . Healthy Sister   . Healthy Child   . Healthy Child     SOCIAL HISTORY: Social History   Social History  . Marital Status: Married    Spouse Name: Courtney Horne  . Number of Children: 2  . Years of Education: 16   Occupational History  .      [employer]   Social History Main Topics  . Smoking status: Former Smoker    Quit date: 05/08/1972  . Smokeless tobacco: Never Used  . Alcohol Use: Yes     Comment: occas. wine(twice weekly)  . Drug Use: No  . Sexual Activity: Yes    Birth Control/ Protection: Surgical   Other Topics  Concern  . Not on file   Social History Narrative   Right handed.  Caffeine 2 cups daily, Married, 2 kids, 5 grand kids.       PHYSICAL EXAM  Filed Vitals:   10/20/15 1406  BP: 112/66  Pulse: 71  Height: 5\' 3"  (1.6 m)  Weight: 158 lb 8 oz (71.895 kg)   Body mass index is 28.08 kg/(m^2).  Generalized: Well developed, in no acute distress   Neurological examination  Mentation: Alert oriented to time, place, history taking. Follows all commands speech and language fluent Cranial nerve II-XII: Pupils were  equal round reactive to light. Extraocular movements were full, visual field were full on confrontational test. Facial sensation and strength were normal. Uvula tongue midline. Head turning and shoulder shrug  were normal and symmetric. Motor: The motor testing reveals 5 over 5 strength of all 4 extremities. Good symmetric motor tone is noted throughout.  Sensory: Sensory testing is intact to soft touch on all 4 extremities. No evidence of extinction is noted.  Coordination: Cerebellar testing reveals good finger-nose-finger and heel-to-shin bilaterally.  Gait and station: Gait is normal. Tandem gait is normal. Romberg is negative. No drift is seen.  Reflexes: Deep tendon reflexes are symmetric and normal bilaterally.   DIAGNOSTIC DATA (LABS, IMAGING, TESTING) - I reviewed patient records, labs, notes, testing and imaging myself where available.  Lab Results  Component Value Date   WBC 6.8 01/18/2015   HGB 14.4 01/18/2015   HCT 43.9 01/18/2015   MCV 87.7 01/18/2015   PLT 210.0 01/18/2015      Component Value Date/Time   NA 138 01/18/2015 0835   K 3.8 01/18/2015 0835   CL 104 01/18/2015 0835   CO2 31 01/18/2015 0835   GLUCOSE 98 01/18/2015 0835   BUN 14 01/18/2015 0835   CREATININE 0.85 01/18/2015 0835   CREATININE 0.96 12/31/2013 1040   CALCIUM 9.5 01/18/2015 0835   PROT 7.0 01/18/2015 0835   ALBUMIN 4.1 01/18/2015 0835   AST 24 01/18/2015 0835   ALT 24 01/18/2015  0835   ALKPHOS 61 01/18/2015 0835   BILITOT 0.6 01/18/2015 0835   Lab Results  Component Value Date   CHOL 159 01/18/2015   HDL 46.10 01/18/2015   LDLCALC 89 01/18/2015   TRIG 120.0 01/18/2015   CHOLHDL 3 01/18/2015       ASSESSMENT AND PLAN 64 y.o. year old female  has a past medical history of Osteopenia; Fibroid; Adenomyosis; Uterine prolapse; Urinary incontinence; Cystocele; Rectocele; Menopausal symptoms; Migraine; Asthma; Asthma; High cholesterol; and Migraine. here with:  1. Migraine headaches  Overall the patient is doing well. She will continue taking amitriptyline 75 mg at bedtime. She will also continue on Imitrex as needed. I have sent in refills for both of these medications. Patient advised that if her migraine frequency or severity increases she should let us know. She will follow-up in one year or sooner if needed.    Ward Givens, MSN, NP-C 10/20/2015, 2:05 PM [facility] [address] [place], [place] [address] [phone]

## 2015-11-19 ENCOUNTER — Other Ambulatory Visit: Payer: Self-pay | Admitting: Cardiology

## 2015-11-21 DIAGNOSIS — M8580 Other specified disorders of bone density and structure, unspecified site: Secondary | ICD-10-CM

## 2015-11-21 HISTORY — DX: Other specified disorders of bone density and structure, unspecified site: M85.80

## 2016-01-10 ENCOUNTER — Other Ambulatory Visit (INDEPENDENT_AMBULATORY_CARE_PROVIDER_SITE_OTHER): Payer: PPO | Admitting: *Deleted

## 2016-01-10 ENCOUNTER — Telehealth: Payer: Self-pay | Admitting: Internal Medicine

## 2016-01-10 DIAGNOSIS — E785 Hyperlipidemia, unspecified: Secondary | ICD-10-CM

## 2016-01-10 LAB — LIPID PANEL
CHOL/HDL RATIO: 2.7 ratio (ref <=5.0)
Cholesterol: 148 mg/dL (ref 125–200)
HDL: 55 mg/dL (ref >=46)
LDL Cholesterol: 74 mg/dL (ref <130)
TRIGLYCERIDES: 94 mg/dL (ref <150)
VLDL: 19 mg/dL (ref <30)

## 2016-01-10 LAB — BASIC METABOLIC PANEL
BUN: 17 mg/dL (ref 7–25)
CO2: 26 mmol/L (ref 20–31)
Calcium: 9.7 mg/dL (ref 8.6–10.4)
Chloride: 104 mmol/L (ref 98–110)
Creat: 0.92 mg/dL (ref 0.50–0.99)
Glucose, Bld: 88 mg/dL (ref 65–99)
POTASSIUM: 4.1 mmol/L (ref 3.5–5.3)
SODIUM: 138 mmol/L (ref 135–146)

## 2016-01-10 LAB — HEPATIC FUNCTION PANEL
ALT: 26 U/L (ref 6–29)
AST: 27 U/L (ref 10–35)
Albumin: 4.1 g/dL (ref 3.6–5.1)
Alkaline Phosphatase: 47 U/L (ref 33–130)
BILIRUBIN DIRECT: 0.1 mg/dL (ref <=0.2)
BILIRUBIN INDIRECT: 0.7 mg/dL (ref 0.2–1.2)
BILIRUBIN TOTAL: 0.8 mg/dL (ref 0.2–1.2)
Total Protein: 6.8 g/dL (ref 6.1–8.1)

## 2016-01-10 LAB — CBC WITH DIFFERENTIAL/PLATELET
BASOS ABS: 0.1 10*3/uL (ref 0.0–0.1)
Basophils Relative: 1 % (ref 0–1)
EOS PCT: 3 % (ref 0–5)
Eosinophils Absolute: 0.2 10*3/uL (ref 0.0–0.7)
HEMATOCRIT: 43.2 % (ref 36.0–46.0)
HEMOGLOBIN: 14.3 g/dL (ref 12.0–15.0)
LYMPHS PCT: 28 % (ref 12–46)
Lymphs Abs: 1.6 10*3/uL (ref 0.7–4.0)
MCH: 29.4 pg (ref 26.0–34.0)
MCHC: 33.1 g/dL (ref 30.0–36.0)
MCV: 88.7 fL (ref 78.0–100.0)
MPV: 10.3 fL (ref 8.6–12.4)
Monocytes Absolute: 0.5 10*3/uL (ref 0.1–1.0)
Monocytes Relative: 8 % (ref 3–12)
NEUTROS ABS: 3.4 10*3/uL (ref 1.7–7.7)
Neutrophils Relative %: 60 % (ref 43–77)
Platelets: 215 10*3/uL (ref 150–400)
RBC: 4.87 MIL/uL (ref 3.87–5.11)
RDW: 13.7 % (ref 11.5–15.5)
WBC: 5.7 10*3/uL (ref 4.0–10.5)

## 2016-01-10 LAB — AST: AST: 27 U/L (ref 10–35)

## 2016-01-10 NOTE — Telephone Encounter (Signed)
New Message   Pt states that she is coming in today for labs. Wanted the office to be aware.

## 2016-01-10 NOTE — Telephone Encounter (Signed)
noted 

## 2016-01-11 ENCOUNTER — Other Ambulatory Visit: Payer: Medicare Other

## 2016-01-28 ENCOUNTER — Ambulatory Visit (INDEPENDENT_AMBULATORY_CARE_PROVIDER_SITE_OTHER): Payer: PPO | Admitting: Internal Medicine

## 2016-01-28 ENCOUNTER — Encounter: Payer: Self-pay | Admitting: Internal Medicine

## 2016-01-28 VITALS — BP 126/70 | HR 59 | Ht 63.0 in | Wt 158.0 lb

## 2016-01-28 DIAGNOSIS — E785 Hyperlipidemia, unspecified: Secondary | ICD-10-CM | POA: Diagnosis not present

## 2016-01-28 NOTE — Progress Notes (Signed)
Cardiology Office Note   Date:  01/28/2016   ID:  TRUDE LUCKEN, DOB May 03, 1951, MRN [id number]  PCP:  Dorris Carnes, MD  Cardiologist:   Dorris Carnes, MD    F?U of HL     History of Present Illness: Courtney Horne is a 65 y.o. female with a history of HL, mkld elevation of LFTs. She was followed by Marella Chimes in past Continues to f/u with J Medoff. Myoview in 2010 normal LVEF 61% Echo: LVEF normal I saw her in Jan 2015 Since seen she has continued to do well  Denies CP Breathing is OK No dizziness   Last labs LDL wa 74  HDL 55   Trig 94     Outpatient Prescriptions Prior to Visit  Medication Sig Dispense Refill  . amitriptyline (ELAVIL) 50 MG tablet Take 1.5 tablets (75 mg total) by mouth at bedtime. 135 tablet 3  . aspirin 81 MG tablet Take 81 mg by mouth daily.    Marland Kitchen estradiol (CLIMARA - DOSED IN MG/24 HR) 0.05 mg/24hr patch Place 0.05 mg onto the skin once a week.    . fexofenadine (ALLEGRA) 180 MG tablet Take 180 mg by mouth daily as needed for allergies or rhinitis.    . Influenza Vac Split High-Dose (FLUZONE HIGH-DOSE) 0.5 ML SUSY     . Multiple Vitamin (MULTIVITAMIN) tablet Take 1 tablet by mouth daily.    Marland Kitchen omeprazole (PRILOSEC) 20 MG capsule Take 20 mg by mouth 2 (two) times daily before a meal.    . Probiotic Product (PROBIOTIC DAILY PO) Take 1 tablet by mouth daily.    . rosuvastatin (CRESTOR) 10 MG tablet TAKE 1 TABLET BY MOUTH EVERY DAY 90 tablet 3  . SUMAtriptan (IMITREX) 100 MG tablet Take 1 tablet (100 mg total) by mouth once. May repeat in 2 hours if headache persists or recurs. 26 tablet 3  . Vitamin D, Ergocalciferol, (DRISDOL) 50000 UNITS CAPS capsule TAKE 1 CAPSULE WEEKLY. 12 capsule 3  . ZOSTAVAX 09811 UNT/0.65ML injection      No facility-administered medications prior to visit.     Allergies:   Codeine; Penicillins; and Zocor   Past Medical History  Diagnosis Date  . Osteopenia   . Fibroid   . Adenomyosis   . Uterine prolapse     . Urinary incontinence   . Cystocele   . Rectocele   . Menopausal symptoms   . Migraine   . Asthma   . Asthma   . High cholesterol   . Migraine     Past Surgical History  Procedure Laterality Date  . Appendectomy    . Vaginal hysterectomy  2011    Vag. Hyst BSO A/P repair-Sling  . Tonsillectomy    . Nasal sinus surgery    . Back surgery  1999    L4-L5  . Colonoscopy  10/2011     Social History:  The patient  reports that she quit smoking about 43 years ago. She has never used smokeless tobacco. She reports that she drinks alcohol. She reports that she does not use illicit drugs.   Family History:  The patient's family history includes Cancer in her mother; Diabetes in her mother; Healthy in her child, child, sister, and sister; Heart disease in her father; Leukemia in her brother and mother; Migraines in her father; Retinitis pigmentosa in her father.    ROS:  Please see the history of present illness. All other systems are reviewed and  Negative to the  above problem except as noted.    PHYSICAL EXAM: VS:  BP 126/70 mmHg  Pulse 59  Ht 5\' 3"  (1.6 m)  Wt 158 lb (71.668 kg)  BMI 28.00 kg/m2  SpO2 98%  GEN: Well nourished, well developed, in no acute distress HEENT: normal Neck: no JVD, carotid bruits, or masses Cardiac: RRR; no murmurs, rubs, or gallops,no edema  Respiratory:  clear to auscultation bilaterally, normal work of breathing GI: soft, nontender, nondistended, + BS  No hepatomegaly  MS: no deformity Moving all extremities   Skin: warm and dry, no rash Neuro:  Strength and sensation are intact Psych: euthymic mood, full affect   EKG:  EKG is ordered today.   Lipid Panel    Component Value Date/Time   CHOL 148 01/10/2016 0934   TRIG 94 01/10/2016 0934   HDL 55 01/10/2016 0934   CHOLHDL 2.7 01/10/2016 0934   VLDL 19 01/10/2016 0934   LDLCALC 74 01/10/2016 0934      Wt Readings from Last 3 Encounters:  01/28/16 158 lb (71.668 kg)  10/20/15 158  lb 8 oz (71.895 kg)  01/25/15 154 lb 12.8 oz (70.217 kg)      ASSESSMENT AND PLAN:  1.  HL  Excellent control  I would keep on current medical regimen  Recomm he continue to stay active.    F/U in 1 year      Current medicines are reviewed at length with the patient today.  The patient does not have concerns regarding medicines.  The following changes have been made:   Labs/ tests ordered today include: No orders of the defined types were placed in this encounter.     Disposition:   FU with  in   Signed, Dorris Carnes, MD  01/28/2016 9:20 AM    [facility] [place], [place], [place]  [address] Phone: [phone]; Fax: [phone]

## 2016-03-13 DIAGNOSIS — H2513 Age-related nuclear cataract, bilateral: Secondary | ICD-10-CM | POA: Diagnosis not present

## 2016-03-13 DIAGNOSIS — H52203 Unspecified astigmatism, bilateral: Secondary | ICD-10-CM | POA: Diagnosis not present

## 2016-03-13 DIAGNOSIS — H5213 Myopia, bilateral: Secondary | ICD-10-CM | POA: Diagnosis not present

## 2016-03-13 DIAGNOSIS — H524 Presbyopia: Secondary | ICD-10-CM | POA: Diagnosis not present

## 2016-04-11 DIAGNOSIS — J069 Acute upper respiratory infection, unspecified: Secondary | ICD-10-CM | POA: Diagnosis not present

## 2016-04-11 DIAGNOSIS — R05 Cough: Secondary | ICD-10-CM | POA: Diagnosis not present

## 2016-05-01 DIAGNOSIS — H10023 Other mucopurulent conjunctivitis, bilateral: Secondary | ICD-10-CM | POA: Diagnosis not present

## 2016-05-17 DIAGNOSIS — R062 Wheezing: Secondary | ICD-10-CM | POA: Diagnosis not present

## 2016-05-17 DIAGNOSIS — J3081 Allergic rhinitis due to animal (cat) (dog) hair and dander: Secondary | ICD-10-CM | POA: Diagnosis not present

## 2016-05-17 DIAGNOSIS — J3089 Other allergic rhinitis: Secondary | ICD-10-CM | POA: Diagnosis not present

## 2016-05-17 DIAGNOSIS — J301 Allergic rhinitis due to pollen: Secondary | ICD-10-CM | POA: Diagnosis not present

## 2016-06-01 ENCOUNTER — Other Ambulatory Visit: Payer: Self-pay

## 2016-06-01 MED ORDER — VITAMIN D (ERGOCALCIFEROL) 1.25 MG (50000 UNIT) PO CAPS: ORAL_CAPSULE | ORAL | Status: DC

## 2016-06-05 ENCOUNTER — Other Ambulatory Visit: Payer: Self-pay | Admitting: *Deleted

## 2016-06-05 DIAGNOSIS — Z79899 Other long term (current) drug therapy: Secondary | ICD-10-CM

## 2016-06-14 ENCOUNTER — Telehealth: Payer: Self-pay | Admitting: Internal Medicine

## 2016-06-14 NOTE — Telephone Encounter (Signed)
Pt called back to set up appointments with RN's.  Please give her a call back.

## 2016-06-16 NOTE — Telephone Encounter (Signed)
Patient needs to schedule lab appointment for Vit D level.  Left detailed message that she could speak with a scheduler if she wants to make an appointment.

## 2016-07-14 ENCOUNTER — Telehealth: Payer: Self-pay | Admitting: Internal Medicine

## 2016-07-14 DIAGNOSIS — Z0000 Encounter for general adult medical examination without abnormal findings: Secondary | ICD-10-CM

## 2016-07-14 NOTE — Telephone Encounter (Signed)
Patient states she was a GYN patient of Dr. Roque Cash, who has retired.  He recommended that she go to [facility] to Dr. Quincy Simmonds to continue care.  I spoke to [facility] at Dr. Elza Rafter office.  Dr. Quincy Simmonds is not accepting new medicare patients without a referral.  Per Lavella Hammock, Dr. Carren Rang did not do official referrals for patient's to be seen by them.  Pt states she sees Dr. Harrington Challenger regularly and considers her her "internist".  I advised that she should obtain a PCP to coordinate her care.  She stated she has specialists for anything that might go on so doesn't feel she needs one.        Pt is aware I am forwarding to Dr. Harrington Challenger for ok to refer to Dr. Quincy Simmonds, [place].   Courtney Horne

## 2016-07-14 NOTE — Telephone Encounter (Signed)
Yes  Please refer to Dr Quincy Simmonds

## 2016-07-14 NOTE — Telephone Encounter (Signed)
New Message:    Pt needs a referral to Dr Josefa Half.

## 2016-07-17 ENCOUNTER — Other Ambulatory Visit: Payer: PPO | Admitting: *Deleted

## 2016-07-17 DIAGNOSIS — Z79899 Other long term (current) drug therapy: Secondary | ICD-10-CM | POA: Diagnosis not present

## 2016-07-18 ENCOUNTER — Telehealth: Payer: Self-pay | Admitting: Obstetrics and Gynecology

## 2016-07-18 LAB — VITAMIN D 25 HYDROXY (VIT D DEFICIENCY, FRACTURES): Vit D, 25-Hydroxy: 77 ng/mL (ref 30–100)

## 2016-07-18 NOTE — Telephone Encounter (Signed)
Called and left a message for patient to call back to schedule a new patient doctor referral for AEX. °

## 2016-07-19 ENCOUNTER — Other Ambulatory Visit: Payer: Self-pay | Admitting: Adult Health

## 2016-08-21 ENCOUNTER — Ambulatory Visit (INDEPENDENT_AMBULATORY_CARE_PROVIDER_SITE_OTHER): Payer: PPO | Admitting: Obstetrics and Gynecology

## 2016-08-21 ENCOUNTER — Encounter: Payer: Self-pay | Admitting: Obstetrics and Gynecology

## 2016-08-21 VITALS — BP 118/80 | HR 76 | Resp 16 | Ht 63.0 in | Wt 148.8 lb

## 2016-08-21 DIAGNOSIS — Z0000 Encounter for general adult medical examination without abnormal findings: Secondary | ICD-10-CM | POA: Diagnosis not present

## 2016-08-21 DIAGNOSIS — Z119 Encounter for screening for infectious and parasitic diseases, unspecified: Secondary | ICD-10-CM | POA: Diagnosis not present

## 2016-08-21 DIAGNOSIS — Z01419 Encounter for gynecological examination (general) (routine) without abnormal findings: Secondary | ICD-10-CM

## 2016-08-21 DIAGNOSIS — Z9223 Personal history of estrogen therapy: Secondary | ICD-10-CM | POA: Diagnosis not present

## 2016-08-21 LAB — POCT URINALYSIS DIPSTICK
BILIRUBIN UA: NEGATIVE
GLUCOSE UA: NEGATIVE
KETONES UA: NEGATIVE
Nitrite, UA: NEGATIVE
Protein, UA: NEGATIVE
Urobilinogen, UA: NEGATIVE
pH, UA: 5

## 2016-08-21 MED ORDER — ESTRADIOL 0.05 MG/24HR TD PTWK: 0.0500 mg | MEDICATED_PATCH | TRANSDERMAL | 3 refills | Status: DC

## 2016-08-21 NOTE — Progress Notes (Signed)
65 y.o. [id number] Married Caucasian female here for annual exam.    Former patient of Dr. Carren Rang and Dr. Ashley Murrain.   On Climara patch 0.05 mg once weekly. Started for dryness symptoms - eyes and vagina.  Still has some vaginal dryness.  Doing a healthy diet and has lost about 10 pounds.  Reflux symptoms improved.   Good bladder control overall.   FH of CVD.   Does labs with Dr. Harrington Challenger.   Works part time as a Occupational psychologist at [employer].  Two daughters.  Five grandchildren.  PCP:   Dr. Dorris Carnes.   No LMP recorded. Patient has had a hysterectomy.           Sexually active: Yes.    The current method of family planning is status post hysterectomy.    Exercising: Yes.    Cardio, Weights, Walking Smoker:  no  Health Maintenance: Pap: 2011 WNL per patient  History of abnormal Pap:  no MMG: 2016 @ Dr. Roque Cash office. Normal per patient Colonoscopy:  < than 5 yrs ago. Dr. Earlean Shawl. Repeat 5 years BMD:  07/03/2012 Result  Osteopenia in R Femur. Normal in rest of tested areas.  TDaP: Last 4-5 years Screening Labs:  Hb today: n/a, Urine today: Trace blood, 2+ WBC's, 5.0 pH - asymptomatic.    reports that she quit smoking about 44 years ago. She has never used smokeless tobacco. She reports that she drinks about 1.2 oz of alcohol per week . She reports that she does not use drugs.  Past Medical History:  Diagnosis Date  . Adenomyosis   . Asthma   . Asthma   . Cystocele   . Fibroid   . High cholesterol   . Menopausal symptoms   . Migraine   . Migraine   . Osteopenia   . Rectocele   . Urinary incontinence   . Uterine prolapse     Past Surgical History:  Procedure Laterality Date  . APPENDECTOMY    . BACK SURGERY  1999   L4-L5  . COLONOSCOPY  10/2011  . NASAL SINUS SURGERY    . TONSILLECTOMY    . VAGINAL HYSTERECTOMY  2011   Vag. Hyst BSO A/P repair-Sling    Current Outpatient Prescriptions  Medication Sig Dispense Refill  . amitriptyline (ELAVIL) 50 MG tablet TAKE 1.5  TABLETS (75 MG TOTAL) BY MOUTH AT BEDTIME. 135 tablet 2  . aspirin 81 MG tablet Take 81 mg by mouth daily.    Marland Kitchen estradiol (CLIMARA - DOSED IN MG/24 HR) 0.05 mg/24hr patch Place 0.05 mg onto the skin once a week.    . fexofenadine (ALLEGRA) 180 MG tablet Take 180 mg by mouth daily as needed for allergies or rhinitis.    . Influenza Vac Split High-Dose (FLUZONE HIGH-DOSE) 0.5 ML SUSY     . Multiple Vitamin (MULTIVITAMIN) tablet Take 1 tablet by mouth daily.    Marland Kitchen omeprazole (PRILOSEC) 20 MG capsule Take 20 mg by mouth 2 (two) times daily before a meal.    . Probiotic Product (PROBIOTIC DAILY PO) Take 1 tablet by mouth daily.    . rosuvastatin (CRESTOR) 10 MG tablet TAKE 1 TABLET BY MOUTH EVERY DAY 90 tablet 3  . SUMAtriptan (IMITREX) 100 MG tablet Take 1 tablet (100 mg total) by mouth once. May repeat in 2 hours if headache persists or recurs. 26 tablet 3  . Vitamin D, Ergocalciferol, (DRISDOL) 50000 units CAPS capsule TAKE 1 CAPSULE WEEKLY. 12 capsule 3  . ZOSTAVAX 09811  UNT/0.65ML injection      No current facility-administered medications for this visit.     Family History  Problem Relation Age of Onset  . Diabetes Mother   . Leukemia Mother   . Cancer Mother     leukemia  . Heart disease Father   . Retinitis pigmentosa Father   . Migraines Father   . Healthy Sister   . Healthy Sister   . Healthy Child   . Healthy Child   . Leukemia Brother     ROS:  Pertinent items are noted in HPI.  Otherwise, a comprehensive ROS was negative.  Exam:   BP 118/80 (BP Location: Right Arm, Patient Position: Sitting, Cuff Size: Normal)   Pulse 76   Resp 16   Ht 5\' 3"  (1.6 m)   Wt 148 lb 12.8 oz (67.5 kg)   BMI 26.36 kg/m     General appearance: alert, cooperative and appears stated age Head: Normocephalic, without obvious abnormality, atraumatic Neck: no adenopathy, supple, symmetrical, trachea midline and thyroid normal to inspection and palpation Lungs: clear to auscultation  bilaterally Breasts: normal appearance, no masses or tenderness, No nipple retraction or dimpling, No nipple discharge or bleeding, No axillary or supraclavicular adenopathy Heart: regular rate and rhythm Abdomen: soft, non-tender; no masses, no organomegaly Extremities: extremities normal, atraumatic, no cyanosis or edema Skin: Skin color, texture, turgor normal. No rashes or lesions Lymph nodes: Cervical, supraclavicular, and axillary nodes normal. No abnormal inguinal nodes palpated Neurologic: Grossly normal  Pelvic: External genitalia:  no lesions              Urethra:  normal appearing urethra with no masses, small area of tissue band of the hymen to the left of the urethra where there is an opening.  (Looks like a suture may have loosened in this area in the past and tissue separated?)              Bartholins and Skenes: normal                 Vagina: normal appearing vagina with normal color and discharge, no lesions              Cervix:  absent              Pap taken: Yes.   Bimanual Exam:  Uterus:   Absent.               Adnexa: no mass, fullness, tenderness              Rectal exam: Yes.  .  Confirms.              Anus:  normal sphincter tone, no lesions  Chaperone was present for exam.  Assessment:   Well woman visit with normal exam. ERT patient.  Status post TVH with prolapse repair. Osteopenia.   Plan: Yearly mammogram recommended after age 59.  Recommended self breast exam.  Pap and HR HPV as above. Discussed Calcium, Vitamin D, regular exercise program including cardiovascular and weight bearing exercise. Discussed WHI and risks of ERT including stroke, DVT, and PE.  Risk of MI and breast cancer not demonstrated in this study.  Wishes to continue with ERT - Climara 0.05 mg weekly #12, RF 3.  Will check Hep C and HIV today.  Routine labs with PCP.  Patient will call [facility] to schedule a mammogram and BMD.  Phone given.  We will fax an order to them.  Patient has  asked for Dr. Roque Cash office to send me her records.  Follow up annually and prn.      After visit summary provided.

## 2016-08-21 NOTE — Patient Instructions (Signed)

## 2016-08-22 LAB — HIV ANTIBODY (ROUTINE TESTING W REFLEX): HIV: NONREACTIVE

## 2016-08-22 LAB — HEPATITIS C ANTIBODY: HCV AB: NEGATIVE

## 2016-10-16 DIAGNOSIS — Z780 Asymptomatic menopausal state: Secondary | ICD-10-CM | POA: Diagnosis not present

## 2016-10-16 DIAGNOSIS — Z1231 Encounter for screening mammogram for malignant neoplasm of breast: Secondary | ICD-10-CM | POA: Diagnosis not present

## 2016-10-18 ENCOUNTER — Other Ambulatory Visit: Payer: Self-pay | Admitting: Internal Medicine

## 2016-10-18 ENCOUNTER — Ambulatory Visit (INDEPENDENT_AMBULATORY_CARE_PROVIDER_SITE_OTHER): Payer: PPO | Admitting: Adult Health

## 2016-10-18 ENCOUNTER — Encounter: Payer: Self-pay | Admitting: Adult Health

## 2016-10-18 VITALS — BP 109/68 | HR 75 | Wt 149.8 lb

## 2016-10-18 DIAGNOSIS — G43009 Migraine without aura, not intractable, without status migrainosus: Secondary | ICD-10-CM

## 2016-10-18 MED ORDER — AMITRIPTYLINE HCL 50 MG PO TABS: 75.0000 mg | ORAL_TABLET | Freq: Every day | ORAL | 3 refills | Status: DC

## 2016-10-18 NOTE — Progress Notes (Signed)
PATIENT: Courtney Horne DOB: [birth year]  REASON FOR VISIT: follow up- migraine headache HISTORY FROM: patient  HISTORY OF PRESENT ILLNESS: Courtney Horne is a 65 year old female with a history of migraine headaches and insomnia. She returns today for follow-up. She is currently on amitriptyline 75 mg at bedtime and Imitrex. He reports that her headaches have been under pretty good control. She states, Soma she will have 0 headaches. The most she has had in a month is 5. She states her headaches always occur on the left side of the face. With severe headache she will have photophobia and phonophobia. Typically she can take Imitrex and her headaches resolve fairly quickly. She does have triggers for her migraines which are the weather, excessive sunlight and lack of sleep. She denies any new neurological symptoms she returns today for an evaluation.   HISTORY 10/20/15:Courtney Horne is a 65 year old female with a history of migraine headaches and insomnia. She returns today for follow-up visit. She continues to take amitriptyline and Imitrex. She reports that she has approximately 3 headaches a month. She states that she can typically take Imitrex and the headache resolves fairly quickly. She does state that she recently increased her amitriptyline to 75 mg and feels that is working better. In the past she has been referred for sleep study and overnight pulse oximetry but she has not had this completed. Patient states that she will wake up at night but she is able to go back to sleep fairly quickly. She denies any new neurological symptoms. She reports that she sees a cardiologist regularly and has EKGs that have been unremarkable. She denies any new medical history. She returns today for an evaluation.   HISTORY 10/19/14 [facility]): [name] is a 65 y.o. female Is seen here as a referral/ revisit from Dr. Rollene Fare for a migraine and insomnia evaluation.  The patient had for many years  some insomnia, would wake up in the midddle of the night and could fall back asleep She developed sinuitis and allergies, and the lack of sleep resulted in increasing Sinus and Migraine headaches.  Sleep habits finally changed with menopause around [age] and she had increasing difficulties to fall asleep.  Once her allergy specialist and her ENT declared her "cleared " By her mid-fourties , she was referred to Dr Fernanda Drum and diagnosed with migraines, treated successfully with Imitrex. Dr. Sudie Bailey had been her Imitrex and Amitriptyline pre-scriber and her headaches and sleep were again well controlled for many years.  Courtney Horne has no shift work history, no history of excessive sleepiness.  Her work hours Are 7 Am to 12 noon, just started part time from full time a couple of month ago. There is some sleepiness after Lunch , but napping a few days a week.  She likes one cup of coffee in AM and one in the afternoon at 3 PM. She is not a Soda, not a Tea trinker , but drinks water .  Bedtimes are : 10.30 lights out, asleep within 30 minutes or less, one bathroom break, snoring rarely- no observed apneas. Not sleepy as a child, not sleep walking or sleep talking, and she has not been suffering from night terrors. She dreams,but only occasionally recalls her dreams.  She sometimes wakes up with headaches, not from headaches, she sees a correlation to dehydration.  ETOH may provoke headaches. Weather changes will bring on headache. FSS 14, GDS 0 , Epworth 6 . 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  Interval history  10-19-14 : Patient with chronic insomnia, migraines and intermittent snoring, still has not had her HST, after multiple attempts to contact her. Here for refills .     REVIEW OF SYSTEMS: Out of a complete 14 system review of symptoms, the patient complains only of the following symptoms, and all other reviewed systems are negative.  ALLERGIES: Allergies  Allergen Reactions   . Codeine Nausea And Vomiting  . Penicillins Itching and Rash  . Zocor [Simvastatin]     HOME MEDICATIONS: Outpatient Medications Prior to Visit  Medication Sig Dispense Refill  . amitriptyline (ELAVIL) 50 MG tablet TAKE 1.5 TABLETS (75 MG TOTAL) BY MOUTH AT BEDTIME. 135 tablet 2  . aspirin 81 MG tablet Take 81 mg by mouth daily.    Marland Kitchen estradiol (CLIMARA - DOSED IN MG/24 HR) 0.05 mg/24hr patch Place 1 patch (0.05 mg total) onto the skin once a week. 12 patch 3  . fexofenadine (ALLEGRA) 180 MG tablet Take 180 mg by mouth daily as needed for allergies or rhinitis.    . Influenza Vac Split High-Dose (FLUZONE HIGH-DOSE) 0.5 ML SUSY     . Multiple Vitamin (MULTIVITAMIN) tablet Take 1 tablet by mouth daily.    Marland Kitchen omeprazole (PRILOSEC) 20 MG capsule Take 20 mg by mouth 2 (two) times daily before a meal.    . Probiotic Product (PROBIOTIC DAILY PO) Take 1 tablet by mouth daily.    . rosuvastatin (CRESTOR) 10 MG tablet TAKE 1 TABLET BY MOUTH EVERY DAY 90 tablet 3  . SUMAtriptan (IMITREX) 100 MG tablet Take 1 tablet (100 mg total) by mouth once. May repeat in 2 hours if headache persists or recurs. 26 tablet 3  . Vitamin D, Ergocalciferol, (DRISDOL) 50000 units CAPS capsule TAKE 1 CAPSULE WEEKLY. 12 capsule 3  . ZOSTAVAX 09811 UNT/0.65ML injection      No facility-administered medications prior to visit.     PAST MEDICAL HISTORY: Past Medical History:  Diagnosis Date  . Adenomyosis   . Asthma   . Asthma   . Cystocele   . Fibroid   . High cholesterol   . Menopausal symptoms   . Migraine   . Migraine   . Osteopenia   . Rectocele   . Urinary incontinence   . Uterine prolapse     PAST SURGICAL HISTORY: Past Surgical History:  Procedure Laterality Date  . APPENDECTOMY    . BACK SURGERY  1999   L4-L5  . BLADDER SUSPENSION    . COLONOSCOPY  10/2011  . NASAL SINUS SURGERY    . TONSILLECTOMY    . VAGINAL HYSTERECTOMY  2011   Vag. Hyst BSO A/P repair-Sling    FAMILY  HISTORY: Family History  Problem Relation Age of Onset  . Diabetes Mother   . Leukemia Mother   . Cancer Mother     leukemia  . Heart disease Father   . Retinitis pigmentosa Father   . Migraines Father   . Healthy Sister   . Healthy Sister   . Healthy Child   . Healthy Child   . Leukemia Brother     SOCIAL HISTORY: Social History   Social History  . Marital status: Married    Spouse name: [name]  . Number of children: 2  . Years of education: 16   Occupational History  .  [employer]    [employer]   Social History Main Topics  . Smoking status: Former Smoker    Quit date: 05/08/1972  . Smokeless tobacco: Never Used  .  Alcohol use 1.2 oz/week    2 Standard drinks or equivalent per week     Comment: occas. wine(twice weekly)  . Drug use: No  . Sexual activity: Yes    Partners: Male    Birth control/ protection: Surgical   Other Topics Concern  . Not on file   Social History Narrative   Right handed.  Caffeine 2 cups daily, Married, 2 kids, 5 grand kids.       PHYSICAL EXAM  Vitals:   10/18/16 1423  BP: 109/68  Pulse: 75  Weight: 149 lb 12.8 oz (67.9 kg)   Body mass index is 26.54 kg/m.  Generalized: Well developed, in no acute distress   Neurological examination  Mentation: Alert oriented to time, place, history taking. Follows all commands speech and language fluent Cranial nerve II-XII: Pupils were equal round reactive to light. Extraocular movements were full, visual field were full on confrontational test. Facial sensation and strength were normal. Uvula tongue midline. Head turning and shoulder shrug  were normal and symmetric. Motor: The motor testing reveals 5 over 5 strength of all 4 extremities. Good symmetric motor tone is noted throughout.  Sensory: Sensory testing is intact to soft touch on all 4 extremities. No evidence of extinction is noted.  Coordination: Cerebellar testing reveals good finger-nose-finger and heel-to-shin bilaterally.  Gait  and station: Gait is normal. Tandem gait is normal. Romberg is negative. No drift is seen.  Reflexes: Deep tendon reflexes are symmetric and normal bilaterally.   DIAGNOSTIC DATA (LABS, IMAGING, TESTING) - I reviewed patient records, labs, notes, testing and imaging myself where available.  Lab Results  Component Value Date   WBC 5.7 01/10/2016   HGB 14.3 01/10/2016   HCT 43.2 01/10/2016   MCV 88.7 01/10/2016   PLT 215 01/10/2016      Component Value Date/Time   NA 138 01/10/2016 0934   K 4.1 01/10/2016 0934   CL 104 01/10/2016 0934   CO2 26 01/10/2016 0934   GLUCOSE 88 01/10/2016 0934   BUN 17 01/10/2016 0934   CREATININE 0.92 01/10/2016 0934   CALCIUM 9.7 01/10/2016 0934   PROT 6.8 01/10/2016 0934   ALBUMIN 4.1 01/10/2016 0934   AST 27 01/10/2016 0934   AST 27 01/10/2016 0934   ALT 26 01/10/2016 0934   ALKPHOS 47 01/10/2016 0934   BILITOT 0.8 01/10/2016 0934   Lab Results  Component Value Date   CHOL 148 01/10/2016   HDL 55 01/10/2016   LDLCALC 74 01/10/2016   TRIG 94 01/10/2016   CHOLHDL 2.7 01/10/2016       ASSESSMENT AND PLAN 65 y.o. year old female  has a past medical history of Adenomyosis; Asthma; Asthma; Cystocele; Fibroid; High cholesterol; Menopausal symptoms; Migraine; Migraine; Osteopenia; Rectocele; Urinary incontinence; and Uterine prolapse. here with:  1. Migraine headache  Overall the patient is doing well. She will continue on amitriptyline 75 mg at bedtime. She reports that her last EKG was normal. She will continue on Imitrex as needed. Patient advised that if her migraine frequency or severity increases she should let us know. She will follow-up in one year or sooner if needed.    Ward Givens, MSN, NP-C 10/18/2016, 2:18 PM [facility] [address] [place], [place] [address] [phone]

## 2016-10-18 NOTE — Patient Instructions (Signed)
Continue Amitriptyline and imitrex If your symptoms worsen or you develop new symptoms please let us know.

## 2016-10-19 NOTE — Progress Notes (Signed)
I agree with the assessment and plan as directed by NP .The patient is known to me .   Gelisa Tieken, MD  

## 2016-10-26 ENCOUNTER — Telehealth: Payer: Self-pay | Admitting: Adult Health

## 2016-10-26 ENCOUNTER — Encounter: Payer: Self-pay | Admitting: Obstetrics and Gynecology

## 2016-10-26 NOTE — Telephone Encounter (Signed)
It does not appear that we have gotten a request for a PA for pt's Amitriptylline.  Pharmacist will fax form to our office/fim

## 2016-10-26 NOTE — Telephone Encounter (Signed)
Caryl Pina with [facility] is following up on PA for medication amitriptyline (ELAVIL) 50 MG tablet.

## 2016-10-27 ENCOUNTER — Telehealth: Payer: Self-pay

## 2016-10-27 ENCOUNTER — Telehealth: Payer: Self-pay | Admitting: *Deleted

## 2016-10-27 NOTE — Telephone Encounter (Signed)
Called patient at [phone] and per DPR, left detailed message stating bone density completely normal per Dr.Silva. Report to be scanned into Epic from [place].

## 2016-11-01 ENCOUNTER — Telehealth: Payer: Self-pay | Admitting: *Deleted

## 2016-11-01 NOTE — Telephone Encounter (Signed)
[place].  Her insurance has denied coverage of Amitriptylline, since it is not fda approved for the tx. of migraines.  It is fda approved for the tx. of depression and neuropathy, but she does not have a hx. of either of these problems, so there is no basis for appeal.  She can get a 30 day supply of Amitriptylline for $4 or a 90 day supply for $10 at either [facility] or [facility]/fim

## 2016-11-03 NOTE — Telephone Encounter (Signed)
See other notes

## 2016-11-08 ENCOUNTER — Telehealth: Payer: Self-pay | Admitting: *Deleted

## 2016-11-08 NOTE — Telephone Encounter (Signed)
LMVM for pt that wanted to let her know that got approval for her amitriptyline. She picked up prescription on 11-02-16 per pharmacy ([facility]

## 2016-11-08 NOTE — Telephone Encounter (Signed)
Received approval for amitriptyline 50mg  tabs from [employer] member),  11-07-16 thru 11-19-17.  [phone].

## 2016-11-20 ENCOUNTER — Other Ambulatory Visit: Payer: Self-pay | Admitting: Adult Health

## 2017-01-01 DIAGNOSIS — J301 Allergic rhinitis due to pollen: Secondary | ICD-10-CM | POA: Diagnosis not present

## 2017-01-01 DIAGNOSIS — J4520 Mild intermittent asthma, uncomplicated: Secondary | ICD-10-CM | POA: Diagnosis not present

## 2017-01-01 DIAGNOSIS — J019 Acute sinusitis, unspecified: Secondary | ICD-10-CM | POA: Diagnosis not present

## 2017-01-01 DIAGNOSIS — J3089 Other allergic rhinitis: Secondary | ICD-10-CM | POA: Diagnosis not present

## 2017-01-14 ENCOUNTER — Other Ambulatory Visit: Payer: Self-pay | Admitting: Internal Medicine

## 2017-01-17 ENCOUNTER — Telehealth: Payer: Self-pay | Admitting: Internal Medicine

## 2017-01-17 DIAGNOSIS — Z8249 Family history of ischemic heart disease and other diseases of the circulatory system: Secondary | ICD-10-CM

## 2017-01-17 DIAGNOSIS — E785 Hyperlipidemia, unspecified: Secondary | ICD-10-CM

## 2017-01-17 DIAGNOSIS — Z0000 Encounter for general adult medical examination without abnormal findings: Secondary | ICD-10-CM

## 2017-01-17 NOTE — Telephone Encounter (Signed)
New message    Pt states she needs a lab appointment prior to her 1 year f/u on the 9th. I advised her that this looked like a regular office visit and she stated that she always has a lab before the appt. I told her I would send the message along for someone to confirm and call back.

## 2017-01-17 NOTE — Telephone Encounter (Signed)
Patient would like to come in before her appointment with Dr. Harrington Challenger on 01/26/17 to have her labs drawn so that her results can be discussed during her appointment. I told the patient that I would send a message to Dr. Harrington Challenger to find out exactly which labs need to be ordered and we would call her back. Patient verbalized understanding and is in agreement with this plan.

## 2017-01-18 NOTE — Telephone Encounter (Signed)
Pt should be set up for BMET, CBC, Lipid and TSH

## 2017-01-18 NOTE — Telephone Encounter (Signed)
Left detailed message on self identified voicemail that I have placed lab orders and that patient can call back to schedule a day to come for blood work.

## 2017-01-23 ENCOUNTER — Other Ambulatory Visit (INDEPENDENT_AMBULATORY_CARE_PROVIDER_SITE_OTHER): Payer: PPO

## 2017-01-23 DIAGNOSIS — E785 Hyperlipidemia, unspecified: Secondary | ICD-10-CM

## 2017-01-23 DIAGNOSIS — Z0000 Encounter for general adult medical examination without abnormal findings: Secondary | ICD-10-CM | POA: Diagnosis not present

## 2017-01-23 DIAGNOSIS — Z8249 Family history of ischemic heart disease and other diseases of the circulatory system: Secondary | ICD-10-CM

## 2017-01-24 LAB — BASIC METABOLIC PANEL
BUN / CREAT RATIO: 17 (ref 12–28)
BUN: 16 mg/dL (ref 8–27)
CHLORIDE: 100 mmol/L (ref 96–106)
CO2: 23 mmol/L (ref 18–29)
CREATININE: 0.95 mg/dL (ref 0.57–1.00)
Calcium: 9.5 mg/dL (ref 8.7–10.3)
GFR calc Af Amer: 72 mL/min/{1.73_m2} (ref >59)
GFR calc non Af Amer: 62 mL/min/{1.73_m2} (ref >59)
Glucose: 95 mg/dL (ref 65–99)
Potassium: 4 mmol/L (ref 3.5–5.2)
Sodium: 141 mmol/L (ref 134–144)

## 2017-01-24 LAB — CBC
HEMOGLOBIN: 13.9 g/dL (ref 11.1–15.9)
Hematocrit: 42.3 % (ref 34.0–46.6)
MCH: 29 pg (ref 26.6–33.0)
MCHC: 32.9 g/dL (ref 31.5–35.7)
MCV: 88 fL (ref 79–97)
PLATELETS: 205 10*3/uL (ref 150–379)
RBC: 4.8 x10E6/uL (ref 3.77–5.28)
RDW: 14 % (ref 12.3–15.4)
WBC: 6.9 10*3/uL (ref 3.4–10.8)

## 2017-01-24 LAB — TSH: TSH: 7.34 u[IU]/mL — AB (ref 0.450–4.500)

## 2017-01-24 LAB — LIPID PANEL
CHOLESTEROL TOTAL: 184 mg/dL (ref 100–199)
Chol/HDL Ratio: 3.1 ratio units (ref 0.0–4.4)
HDL: 60 mg/dL (ref >39)
LDL Calculated: 104 mg/dL — ABNORMAL HIGH (ref 0–99)
TRIGLYCERIDES: 101 mg/dL (ref 0–149)
VLDL CHOLESTEROL CAL: 20 mg/dL (ref 5–40)

## 2017-01-25 NOTE — Progress Notes (Signed)
Cardiology Office Note   Date:  01/26/2017   ID:  DENALI SHARMA, DOB [birth year], MRN [id number]  PCP:  Dorris Carnes, MD  Cardiologist:   Dorris Carnes, MD    F/U of HL     History of Present Illness: Courtney Horne is a 66 y.o. female with a history of HL, mild elevation of LFTs  Myoview in 2010 normal  LVEF normal     Followed by Marella Chimes in past  COnt to follow with J Medoff    SInce I saw her in clinic one year ago she has done well  Breathing is OK  Active Admits to eatting poorly before recent blood work  Visiting sis who cooks unhealthy food       Current Meds  Medication Sig  . amitriptyline (ELAVIL) 50 MG tablet Take 1.5 tablets (75 mg total) by mouth at bedtime.  Marland Kitchen aspirin 81 MG tablet Take 81 mg by mouth daily.  Marland Kitchen estradiol (CLIMARA - DOSED IN MG/24 HR) 0.05 mg/24hr patch Place 1 patch (0.05 mg total) onto the skin once a week.  . fexofenadine (ALLEGRA) 180 MG tablet Take 180 mg by mouth daily as needed for allergies or rhinitis.  . Influenza Vac Split High-Dose (FLUZONE HIGH-DOSE) 0.5 ML SUSY   . Multiple Vitamin (MULTIVITAMIN) tablet Take 1 tablet by mouth daily.  Marland Kitchen omeprazole (PRILOSEC) 20 MG capsule Take 20 mg by mouth 2 (two) times daily before a meal.  . Probiotic Product (PROBIOTIC DAILY PO) Take 1 tablet by mouth daily.  . rosuvastatin (CRESTOR) 10 MG tablet TAKE 1 TABLET BY MOUTH EVERY DAY  . Vitamin D, Ergocalciferol, (DRISDOL) 50000 units CAPS capsule TAKE 1 CAPSULE WEEKLY.  Durenda Guthrie 42595 UNT/0.65ML injection      Allergies:   Codeine; Penicillins; and Zocor [simvastatin]   Past Medical History:  Diagnosis Date  . Adenomyosis   . Asthma   . Asthma   . Cystocele   . Fibroid   . High cholesterol   . Menopausal symptoms   . Migraine   . Migraine   . Osteopenia   . Rectocele   . Urinary incontinence   . Uterine prolapse     Past Surgical History:  Procedure Laterality Date  . APPENDECTOMY    . BACK SURGERY  1999   L4-L5  .  BLADDER SUSPENSION    . COLONOSCOPY  10/2011  . NASAL SINUS SURGERY    . TONSILLECTOMY    . VAGINAL HYSTERECTOMY  2011   Vag. Hyst BSO A/P repair-Sling     Social History:  The patient  reports that she quit smoking about 44 years ago. She has never used smokeless tobacco. She reports that she drinks about 1.2 oz of alcohol per week . She reports that she does not use drugs.   Family History:  The patient's family history includes Cancer in her mother; Diabetes in her mother; Healthy in her child, child, sister, and sister; Heart disease in her father; Leukemia in her brother and mother; Migraines in her father; Retinitis pigmentosa in her father.    ROS:  Please see the history of present illness. All other systems are reviewed and  Negative to the above problem except as noted.    PHYSICAL EXAM: VS:  BP 114/74   Pulse 62   Ht 5\' 3"  (1.6 m)   Wt 148 lb 12.8 oz (67.5 kg)   BMI 26.36 kg/m   GEN: Well nourished, well developed, in no acute  distress  HEENT: normal  Neck: no JVD, carotid bruits, or masses Cardiac: RRR; no murmurs, rubs, or gallops,no edema  Respiratory:  clear to auscultation bilaterally, normal work of breathing GI: soft, nontender, nondistended, + BS  No hepatomegaly  MS: no deformity Moving all extremities   Skin: warm and dry, no rash Neuro:  Strength and sensation are intact Psych: euthymic mood, full affect   EKG:  EKG is ordered today.SR 63 bpm  NOnspecific ST changes     Lipid Panel    Component Value Date/Time   CHOL 184 01/23/2017 0925   TRIG 101 01/23/2017 0925   HDL 60 01/23/2017 0925   CHOLHDL 3.1 01/23/2017 0925   CHOLHDL 2.7 01/10/2016 0934   VLDL 19 01/10/2016 0934   LDLCALC 104 (H) 01/23/2017 0925      Wt Readings from Last 3 Encounters:  01/26/17 148 lb 12.8 oz (67.5 kg)  10/18/16 149 lb 12.8 oz (67.9 kg)  08/21/16 148 lb 12.8 oz (67.5 kg)      ASSESSMENT AND PLAN:  1  HL  LDL is higher  WIll get back on diet  At the end of  June check lipiomed panel  Consider CA scoring if not numbers not optimal to guid Rx     2  Thyroid  Will check Free t3 free t4  Stay active  F/U 1 yr in clinic     Current medicines are reviewed at length with the patient today.  The patient does not have concerns regarding medicines.  Signed, Dorris Carnes, MD  01/26/2017 9:35 AM    [address], [place], [place]  [address] Phone: [phone]; Fax: [phone]

## 2017-01-26 ENCOUNTER — Ambulatory Visit (INDEPENDENT_AMBULATORY_CARE_PROVIDER_SITE_OTHER): Payer: PPO | Admitting: Internal Medicine

## 2017-01-26 ENCOUNTER — Encounter: Payer: Self-pay | Admitting: Internal Medicine

## 2017-01-26 VITALS — BP 114/74 | HR 62 | Ht 63.0 in | Wt 148.8 lb

## 2017-01-26 DIAGNOSIS — E785 Hyperlipidemia, unspecified: Secondary | ICD-10-CM

## 2017-01-26 DIAGNOSIS — E0590 Thyrotoxicosis, unspecified without thyrotoxic crisis or storm: Secondary | ICD-10-CM

## 2017-01-26 MED ORDER — VITAMIN D (ERGOCALCIFEROL) 1.25 MG (50000 UNIT) PO CAPS: ORAL_CAPSULE | ORAL | 3 refills | Status: DC

## 2017-01-26 MED ORDER — ROSUVASTATIN CALCIUM 10 MG PO TABS: 10.0000 mg | ORAL_TABLET | Freq: Every day | ORAL | 3 refills | Status: DC

## 2017-01-26 NOTE — Patient Instructions (Signed)
Medication Instructions:  Your physician recommends that you continue on your current medications as directed. Please refer to the Current Medication list given to you today.   Labwork: LABS TODAY: FREE T3 & FREE T4  Your physician recommends that you return for FASTING lab work on June 22nd for: Apolipoprotein B Lipoprotein A (LPA) NMR Lipoprofile   Testing/Procedures: None ordered  Follow-Up: Your physician wants you to follow-up in: 1 year with Dr. Harrington Challenger. You will receive a reminder letter in the mail two months in advance. If you don't receive a letter, please call our office to schedule the follow-up appointment.   Any Other Special Instructions Will Be Listed Below (If Applicable).     If you need a refill on your cardiac medications before your next appointment, please call your pharmacy.

## 2017-01-27 LAB — T4, FREE: FREE T4: 1.03 ng/dL (ref 0.82–1.77)

## 2017-01-27 LAB — T3, FREE: T3 FREE: 2 pg/mL (ref 2.0–4.4)

## 2017-01-29 ENCOUNTER — Encounter: Payer: Self-pay | Admitting: Internal Medicine

## 2017-02-01 ENCOUNTER — Other Ambulatory Visit: Payer: Self-pay | Admitting: *Deleted

## 2017-02-01 DIAGNOSIS — R7989 Other specified abnormal findings of blood chemistry: Secondary | ICD-10-CM

## 2017-02-12 ENCOUNTER — Encounter: Payer: Self-pay | Admitting: Internal Medicine

## 2017-02-12 NOTE — Telephone Encounter (Signed)
Have pt set up for TSH whne has lipids checked later this summer

## 2017-02-16 DIAGNOSIS — K219 Gastro-esophageal reflux disease without esophagitis: Secondary | ICD-10-CM | POA: Diagnosis not present

## 2017-04-24 DIAGNOSIS — J301 Allergic rhinitis due to pollen: Secondary | ICD-10-CM | POA: Diagnosis not present

## 2017-04-24 DIAGNOSIS — J3089 Other allergic rhinitis: Secondary | ICD-10-CM | POA: Diagnosis not present

## 2017-04-24 DIAGNOSIS — J4520 Mild intermittent asthma, uncomplicated: Secondary | ICD-10-CM | POA: Diagnosis not present

## 2017-04-24 DIAGNOSIS — J4531 Mild persistent asthma with (acute) exacerbation: Secondary | ICD-10-CM | POA: Diagnosis not present

## 2017-04-26 DIAGNOSIS — J4520 Mild intermittent asthma, uncomplicated: Secondary | ICD-10-CM | POA: Diagnosis not present

## 2017-04-26 DIAGNOSIS — J3089 Other allergic rhinitis: Secondary | ICD-10-CM | POA: Diagnosis not present

## 2017-04-26 DIAGNOSIS — J3081 Allergic rhinitis due to animal (cat) (dog) hair and dander: Secondary | ICD-10-CM | POA: Diagnosis not present

## 2017-04-26 DIAGNOSIS — J301 Allergic rhinitis due to pollen: Secondary | ICD-10-CM | POA: Diagnosis not present

## 2017-05-11 ENCOUNTER — Other Ambulatory Visit: Payer: PPO | Admitting: *Deleted

## 2017-05-11 DIAGNOSIS — E785 Hyperlipidemia, unspecified: Secondary | ICD-10-CM

## 2017-05-12 LAB — NMR, LIPOPROFILE
Cholesterol: 160 mg/dL (ref 100–199)
HDL CHOLESTEROL BY NMR: 73 mg/dL (ref >39)
HDL Particle Number: 43.1 umol/L (ref >=30.5)
LDL PARTICLE NUMBER: 715 nmol/L (ref <1000)
LDL Size: 20.8 nm (ref >20.5)
LDL-C: 74 mg/dL (ref 0–99)
LP-IR Score: 26 (ref <=45)
Small LDL Particle Number: 241 nmol/L (ref <=527)
TRIGLYCERIDES BY NMR: 65 mg/dL (ref 0–149)

## 2017-05-12 LAB — APOLIPOPROTEIN B: Apolipoprotein B: 73 mg/dL (ref 54–133)

## 2017-05-12 LAB — LIPOPROTEIN A (LPA): LIPOPROTEIN (A): 7 nmol/L (ref <75)

## 2017-05-15 ENCOUNTER — Telehealth: Payer: Self-pay | Admitting: Internal Medicine

## 2017-05-15 NOTE — Telephone Encounter (Signed)
Lab results have not been reviewed by Dr. Harrington Challenger as of this time.

## 2017-05-15 NOTE — Telephone Encounter (Signed)
°  Follow Up   Calling to follow up on most results lab results. Please call.

## 2017-05-17 NOTE — Telephone Encounter (Signed)
°  Follow Up ° °Calling to follow up on most recent lab results. Please call. °

## 2017-05-17 NOTE — Telephone Encounter (Signed)
Reviewed lipomed panel with patient.  She asked about thyroid labs as well.  These were not completed.  She will come tomorrow for further lab work.  TSH, free T3 and free T4 lab orders are already placed.  Appointment for tomorrow.

## 2017-05-18 ENCOUNTER — Other Ambulatory Visit: Payer: PPO | Admitting: *Deleted

## 2017-05-18 DIAGNOSIS — R7989 Other specified abnormal findings of blood chemistry: Secondary | ICD-10-CM

## 2017-05-18 DIAGNOSIS — R946 Abnormal results of thyroid function studies: Secondary | ICD-10-CM | POA: Diagnosis not present

## 2017-05-18 LAB — T3, FREE: T3 FREE: 1.9 pg/mL — AB (ref 2.0–4.4)

## 2017-05-18 LAB — T4, FREE: Free T4: 0.85 ng/dL (ref 0.82–1.77)

## 2017-05-18 LAB — TSH: TSH: 4.27 u[IU]/mL (ref 0.450–4.500)

## 2017-05-21 ENCOUNTER — Telehealth: Payer: Self-pay | Admitting: *Deleted

## 2017-05-21 NOTE — Telephone Encounter (Signed)
DPR ok to [facility] on cell # LMOM lab work normal and to continue on current medications at this time. If any questions please call [phone].

## 2017-05-21 NOTE — Telephone Encounter (Signed)
-----   Message from Fay Records, MD sent at 05/19/2017  9:55 PM EDT ----- Thyroid function is normal  Keep on same medicines

## 2017-06-18 ENCOUNTER — Telehealth: Payer: Self-pay | Admitting: Adult Health

## 2017-06-18 NOTE — Telephone Encounter (Addendum)
Spoke with patient to discuss. She stated that her migraine is calmer today than yesterday. She took two Imitrex yesterday, has only taken one today and drank a cup of coffee today. She stated that it took the edge off of her migraine today. She attributes her migraine to the changing weather recently, stated she hasn't had a migraine "like this" in a long time. Informed her that Dr Brett Fairy stated she could come in for a Depakote infusion. Advised her this RN had discussed infusion with Mindy, infusion RN who stated her schedule was full this afternoon with 4 additional patients added on. Advised patient Mindy stated she could come in at any time tomorrow and be treated if she wanted to do that.  Patient stated she has never had Depakote infusion before. She stated that she will wait until tomorrow morning and see how she feels since her migraine is better today than yesterday. Patient stated she also wants an appointment with Dr Brett Fairy or Jinny Blossom next week to "come up with a plan if this ever occurs again".  Patient verbalized understanding, appreciation of call back and will call this RN in the morning.

## 2017-06-18 NOTE — Telephone Encounter (Signed)
Patient called office in reference to having an ongoing since yesterday migraine.  Patient states she has been having migraines for 2 weeks totaling about 10.  Patient has been taking medication as prescribed, but feeling very shaky, nausea.  Please call

## 2017-06-18 NOTE — Telephone Encounter (Signed)
Please offer her to come in for a depakote infusion.

## 2017-06-18 NOTE — Telephone Encounter (Signed)
With megan , please .

## 2017-06-19 NOTE — Telephone Encounter (Signed)
FYI-Patient calling stating she is doing better and does not need an infusion. A returned call is not needed.

## 2017-06-19 NOTE — Telephone Encounter (Signed)
Spoke with patient to discuss an earlier follow up as she had requested yesterday. Patient stated she has a FU with Dr Brett Fairy in Nov, so she will keep that appointment. She stated the phone staff had told her an earlier FU with Dr Dohmeier wasn't available. This RN advised her an appt with NP is on hold Aug 7th. Patient stated she will be out of town most of Aug. Offered her the next open appt with NP in Sept; patient declined stating it was a Jewish holiday. She stated she preferred to see Dr Brett Fairy. This RN advised that if she is seeing NP when Dr Brett Fairy is in the office, the NP can discuss any questions with dr if needed. Patient stated she will just keep her FU in Nov. Advised she call the office if she has any issues, problems before then or if her migraine occurs again and is not well controlled with her current medications. She verbalized understanding, appreciation.

## 2017-06-25 DIAGNOSIS — H524 Presbyopia: Secondary | ICD-10-CM | POA: Diagnosis not present

## 2017-06-25 DIAGNOSIS — H2513 Age-related nuclear cataract, bilateral: Secondary | ICD-10-CM | POA: Diagnosis not present

## 2017-06-25 DIAGNOSIS — H5213 Myopia, bilateral: Secondary | ICD-10-CM | POA: Diagnosis not present

## 2017-06-25 DIAGNOSIS — H52203 Unspecified astigmatism, bilateral: Secondary | ICD-10-CM | POA: Diagnosis not present

## 2017-07-10 DIAGNOSIS — Z1389 Encounter for screening for other disorder: Secondary | ICD-10-CM | POA: Diagnosis not present

## 2017-07-10 DIAGNOSIS — K219 Gastro-esophageal reflux disease without esophagitis: Secondary | ICD-10-CM | POA: Diagnosis not present

## 2017-07-10 DIAGNOSIS — J3089 Other allergic rhinitis: Secondary | ICD-10-CM | POA: Diagnosis not present

## 2017-07-10 DIAGNOSIS — E784 Other hyperlipidemia: Secondary | ICD-10-CM | POA: Diagnosis not present

## 2017-07-10 DIAGNOSIS — H6121 Impacted cerumen, right ear: Secondary | ICD-10-CM | POA: Diagnosis not present

## 2017-07-10 DIAGNOSIS — Z6825 Body mass index (BMI) 25.0-25.9, adult: Secondary | ICD-10-CM | POA: Diagnosis not present

## 2017-07-10 DIAGNOSIS — J45998 Other asthma: Secondary | ICD-10-CM | POA: Diagnosis not present

## 2017-07-10 DIAGNOSIS — G4709 Other insomnia: Secondary | ICD-10-CM | POA: Diagnosis not present

## 2017-07-10 DIAGNOSIS — Z0000 Encounter for general adult medical examination without abnormal findings: Secondary | ICD-10-CM | POA: Diagnosis not present

## 2017-07-10 DIAGNOSIS — G43909 Migraine, unspecified, not intractable, without status migrainosus: Secondary | ICD-10-CM | POA: Diagnosis not present

## 2017-08-24 ENCOUNTER — Other Ambulatory Visit: Payer: PPO

## 2017-08-29 ENCOUNTER — Ambulatory Visit (INDEPENDENT_AMBULATORY_CARE_PROVIDER_SITE_OTHER): Payer: PPO | Admitting: Obstetrics and Gynecology

## 2017-08-29 ENCOUNTER — Encounter: Payer: Self-pay | Admitting: Obstetrics and Gynecology

## 2017-08-29 VITALS — BP 130/62 | HR 64 | Resp 16 | Ht 63.0 in | Wt 145.4 lb

## 2017-08-29 DIAGNOSIS — R8290 Unspecified abnormal findings in urine: Secondary | ICD-10-CM | POA: Diagnosis not present

## 2017-08-29 DIAGNOSIS — Z01419 Encounter for gynecological examination (general) (routine) without abnormal findings: Secondary | ICD-10-CM | POA: Diagnosis not present

## 2017-08-29 DIAGNOSIS — Z0000 Encounter for general adult medical examination without abnormal findings: Secondary | ICD-10-CM

## 2017-08-29 LAB — POCT URINALYSIS DIPSTICK
Bilirubin, UA: NEGATIVE
GLUCOSE UA: NEGATIVE
KETONES UA: NEGATIVE
Nitrite, UA: NEGATIVE
PROTEIN UA: NEGATIVE
RBC UA: NEGATIVE
UROBILINOGEN UA: 0.2 U/dL
pH, UA: 5 (ref 5.0–8.0)

## 2017-08-29 MED ORDER — ESTRADIOL 0.0375 MG/24HR TD PTTW: 1.0000 | MEDICATED_PATCH | TRANSDERMAL | 11 refills | Status: DC

## 2017-08-29 NOTE — Progress Notes (Signed)
66 y.o. [id number] Married Caucasian female here for annual exam.    On ERT.  Noticed her urine was more yellow in color.  Asked for urine testing today.   ROS dry mouth, more at night.  Taking Zyrtec at night.   Did flu vaccine last week,  Did her 2 shingles vaccines.   PCP: Dr. Joylene Draft Cardiology:  Dr. Dorris Carnes  No LMP recorded. Patient has had a hysterectomy.           Sexually active: Yes.  female  The current method of family planning is status post hysterectomy.    Exercising: Yes.    Golf, gym Smoker:  no  Health Maintenance: Pap: 2011 Neg per patient History of abnormal Pap:  no MMG: 10-16-16 3D Density C/Neg/BiRads1:[facility]  Colonoscopy: patient is due 2019 with Dr.Medoff BMD: 10-16-16 Result Normal:[facility] TDaP:  2013 Gardasil:   no  HIV:08-21-16 NR Hep C:08-21-16 Neg Screening Labs:  Hb today: Dr.Paula Ross, Urine today: 1+WBCs(patient requested today)   reports that she quit smoking about 45 years ago. She has never used smokeless tobacco. She reports that she drinks about 1.8 oz of alcohol per week . She reports that she does not use drugs.  Past Medical History:  Diagnosis Date  . Adenomyosis   . Asthma   . Asthma   . Cystocele   . Fibroid   . High cholesterol   . Menopausal symptoms   . Migraine   . Migraine   . Osteopenia 2017   Resolved.  BMD normal in 2017.  Marland Kitchen Rectocele   . Urinary incontinence   . Uterine prolapse     Past Surgical History:  Procedure Laterality Date  . APPENDECTOMY    . BACK SURGERY  1999   L4-L5  . BLADDER SUSPENSION    . COLONOSCOPY  10/2011  . NASAL SINUS SURGERY    . TONSILLECTOMY    . VAGINAL HYSTERECTOMY  2011   Vag. Hyst BSO A/P repair-Sling    Current Outpatient Prescriptions  Medication Sig Dispense Refill  . amitriptyline (ELAVIL) 50 MG tablet Take 1.5 tablets (75 mg total) by mouth at bedtime. 135 tablet 3  . ARNUITY ELLIPTA 200 MCG/ACT AEPB Inhale 1 puff into the lungs as needed.  4  . aspirin 81 MG tablet  Take 81 mg by mouth daily.    . cetirizine (ZYRTEC) 10 MG tablet Take 10 mg by mouth daily.    . Influenza Vac Split High-Dose (FLUZONE HIGH-DOSE) 0.5 ML SUSY     . Multiple Vitamin (MULTIVITAMIN) tablet Take 1 tablet by mouth daily.    Marland Kitchen omeprazole (PRILOSEC) 20 MG capsule Take 20 mg by mouth 2 (two) times daily before a meal.    . Probiotic Product (PROBIOTIC DAILY PO) Take 1 tablet by mouth daily.    . rosuvastatin (CRESTOR) 10 MG tablet Take 1 tablet (10 mg total) by mouth daily. 90 tablet 3  . Vitamin D, Ergocalciferol, (DRISDOL) 50000 units CAPS capsule TAKE 1 CAPSULE WEEKLY. 12 capsule 3  . ZOSTAVAX 10258 UNT/0.65ML injection     . estradiol (MINIVELLE) 0.0375 MG/24HR Place 1 patch onto the skin 2 (two) times a week. Apply anywhere on lower abdomen twice weekly. 8 patch 11  . SUMAtriptan (IMITREX) 100 MG tablet TAKE 1 TABLET (100 MG TOTAL) BY MOUTH ONCE. MAY REPEAT IN 2 HOURS IF HEADACHE PERSISTS OR RECURS. 26 tablet 2   No current facility-administered medications for this visit.     Family History  Problem Relation  Age of Onset  . Diabetes Mother   . Leukemia Mother   . Cancer Mother        leukemia  . Heart disease Father   . Retinitis pigmentosa Father   . Migraines Father   . Healthy Sister   . Healthy Sister   . Healthy Child   . Healthy Child   . Leukemia Brother     ROS:  Pertinent items are noted in HPI.  Otherwise, a comprehensive ROS was negative.  Exam:   BP 130/62 (BP Location: Right Arm, Patient Position: Sitting, Cuff Size: Normal)   Pulse 64   Resp 16   Ht 5\' 3"  (1.6 m)   Wt 145 lb 6.4 oz (66 kg)   BMI 25.76 kg/m     General appearance: alert, cooperative and appears stated age Head: Normocephalic, without obvious abnormality, atraumatic Neck: no adenopathy, supple, symmetrical, trachea midline and thyroid normal to inspection and palpation Lungs: clear to auscultation bilaterally Breasts: normal appearance, no masses or tenderness, No nipple  retraction or dimpling, No nipple discharge or bleeding, No axillary or supraclavicular adenopathy Heart: regular rate and rhythm Abdomen: soft, non-tender; no masses, no organomegaly Extremities: extremities normal, atraumatic, no cyanosis or edema Skin: Skin color, texture, turgor normal. No rashes or lesions Lymph nodes: Cervical, supraclavicular, and axillary nodes normal. No abnormal inguinal nodes palpated Neurologic: Grossly normal  Pelvic: External genitalia:  no lesions              Urethra:  normal appearing urethra with no masses, tenderness or lesions              Bartholins and Skenes: normal                 Vagina: normal appearing vagina with normal color and discharge, no lesions              Cervix: absent.               Pap taken: No. Bimanual Exam:  Uterus:  Absent.               Adnexa: no mass, fullness, tenderness              Rectal exam: Yes.  .  Confirms.              Anus:  normal sphincter tone, no lesions  Chaperone was present for exam.  Assessment:   Well woman visit with normal exam. ERT patient.  Status post TVH with prolapse repair. Osteopenia. Resolved with last BMD. FH CAD. Abnormal urine.  Dry mouth.   Plan: Mammogram screening discussed. Recommended self breast awareness. Pap and HR HPV as above. Guidelines for Calcium, Vitamin D, regular exercise program including cardiovascular and weight bearing exercise. Will wean down on estrogen to [place] 0.0375 mg twice weekly.  We discussed the WHI and risk of stroke, DTV, PE, and possible breast cancer with ERT. Urine micro and culture.  Stop Zyrtec at hs.  Follow up annually and prn.    After visit summary provided.

## 2017-08-29 NOTE — Patient Instructions (Signed)

## 2017-08-30 ENCOUNTER — Encounter: Payer: Self-pay | Admitting: Obstetrics and Gynecology

## 2017-08-30 LAB — URINALYSIS, MICROSCOPIC ONLY: CASTS: NONE SEEN /LPF

## 2017-08-30 LAB — URINE CULTURE

## 2017-09-01 ENCOUNTER — Other Ambulatory Visit: Payer: Self-pay | Admitting: Obstetrics and Gynecology

## 2017-10-17 ENCOUNTER — Encounter: Payer: Self-pay | Admitting: Neurology

## 2017-10-17 ENCOUNTER — Ambulatory Visit: Payer: PPO | Admitting: Neurology

## 2017-10-17 VITALS — BP 111/71 | HR 81 | Ht 63.0 in | Wt 150.0 lb

## 2017-10-17 DIAGNOSIS — G43001 Migraine without aura, not intractable, with status migrainosus: Secondary | ICD-10-CM

## 2017-10-17 DIAGNOSIS — G4700 Insomnia, unspecified: Secondary | ICD-10-CM

## 2017-10-17 MED ORDER — AMITRIPTYLINE HCL 50 MG PO TABS: 75.0000 mg | ORAL_TABLET | Freq: Every day | ORAL | 3 refills | Status: DC

## 2017-10-17 MED ORDER — SUMATRIPTAN SUCCINATE 100 MG PO TABS: 100.0000 mg | ORAL_TABLET | Freq: Once | ORAL | 2 refills | Status: DC

## 2017-10-17 NOTE — Patient Instructions (Signed)
Please remember to try to maintain good sleep hygiene, which means: Keep a regular sleep and wake schedule, try not to exercise or have a meal within 2 hours of your bedtime, try to keep your bedroom conducive for sleep, that is, cool and dark, without light distractors such as an illuminated alarm clock, and refrain from watching TV right before sleep or in the middle of the night and do not keep the TV or radio on during the night. Also, try not to use or play on electronic devices at bedtime, such as your cell phone, tablet PC or laptop. If you like to read at bedtime on an electronic device, try to dim the background light as much as possible. Do not eat in the middle of the night.     

## 2017-10-17 NOTE — Progress Notes (Signed)
PATIENT: Courtney Horne DOB: 12-02-1950  REASON FOR VISIT: follow up- migraine headache HISTORY FROM: patient  HISTORY OF PRESENT ILLNESS:  17 October 2017, I have the pleasure of meeting with Courtney Horne today, 66 year old [place], still working part-time.  She endorsed today ongoing relief from insomnia and improvement of migraines at her current regimen of amitriptyline.  We will refill the medication today.  She has regular follow-ups with her cardiologist, which is not Dr. Dorris Carnes, and EKGs have never shown a prolonged QT time. Refill today. Paternal family history of heart disease.     Courtney Horne is a 66 year old female with a history of migraine headaches and insomnia. She returns today for follow-up. She is currently on amitriptyline 75 mg at bedtime and Imitrex. He reports that her headaches have been under pretty good control. She states, [place] she will have 0 headaches. The most she has had in a month is 5. She states her headaches always occur on the left side of the face. With severe headache she will have photophobia and phonophobia. Typically she can take Imitrex and her headaches resolve fairly quickly. She does have triggers for her migraines which are the weather, excessive sunlight and lack of sleep. She denies any new neurological symptoms she returns today for an evaluation.   HISTORY 10/20/15:Courtney Horne is a 66 year old female with a history of migraine headaches and insomnia. She returns today for follow-up visit. She continues to take amitriptyline and Imitrex. She reports that she has approximately 3 headaches a month. She states that she can typically take Imitrex and the headache resolves fairly quickly. She does state that she recently increased her amitriptyline to 75 mg and feels that is working better. In the past she has been referred for sleep study and overnight pulse oximetry but she has not had this completed. Patient states that she will  wake up at night but she is able to go back to sleep fairly quickly. She denies any new neurological symptoms. She reports that she sees a cardiologist regularly and has EKGs that have been unremarkable. She denies any new medical history. She returns today for an evaluation.   HISTORY 10/19/14 Courtney Horne): Courtney Horne is a 66 y.o. female Is seen here as a referral/ revisit from Dr. Rollene Fare for a migraine and insomnia evaluation.  The patient had for many years some insomnia, would wake up in the midddle of the night and could fall back asleep She developed sinuitis and allergies, and the lack of sleep resulted in increasing Sinus and Migraine headaches.  Sleep habits finally changed with menopause around [age] and she had increasing difficulties to fall asleep.  Once her allergy specialist and her ENT declared her "cleared " By her mid-fourties , she was referred to Dr Fernanda Drum and diagnosed with migraines, treated successfully with Imitrex. Dr. Sudie Bailey had been her Imitrex and Amitriptyline pre-scriber and her headaches and sleep were again well controlled for many years.  Courtney Horne has no shift work history, no history of excessive sleepiness.  Her work hours Are 7 Am to 12 noon, just started part time from full time a couple of month ago. There is some sleepiness after Lunch , but napping a few days a week.  She likes one cup of coffee in AM and one in the afternoon at 3 PM. She is not a Soda, not a Tea trinker , but drinks water .  Bedtimes are : 10.30 lights out, asleep within  30 minutes or less, one bathroom break, snoring rarely- no observed apneas. Not sleepy as a child, not sleep walking or sleep talking, and she has not been suffering from night terrors. She dreams,but only occasionally recalls her dreams.  She sometimes wakes up with headaches, not from headaches, she sees a correlation to dehydration.  ETOH may provoke headaches. Weather changes  will bring on headache. FSS 14, GDS 0 , Epworth 6 . 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  Interval history 10-19-14 : Patient with chronic insomnia, migraines and intermittent snoring, still has not had her HST, after multiple attempts to contact her. Here for refills .     REVIEW OF SYSTEMS: Out of a complete 14 system review of symptoms, the patient complains only of the following symptoms, and all other reviewed systems are negative.  ALLERGIES: Allergies  Allergen Reactions  . Codeine Nausea And Vomiting  . Penicillins Itching and Rash  . Zocor [Simvastatin] Other (See Comments)    HOME MEDICATIONS: Outpatient Medications Prior to Visit  Medication Sig Dispense Refill  . amitriptyline (ELAVIL) 50 MG tablet Take 1.5 tablets (75 mg total) by mouth at bedtime. 135 tablet 3  . ARNUITY ELLIPTA 200 MCG/ACT AEPB Inhale 1 puff into the lungs as needed.  4  . aspirin 81 MG tablet Take 81 mg by mouth daily.    Marland Kitchen estradiol (MINIVELLE) 0.0375 MG/24HR Place 1 patch onto the skin 2 (two) times a week. Apply anywhere on lower abdomen twice weekly. 8 patch 11  . fexofenadine (ALLEGRA) 180 MG tablet Take 180 mg by mouth daily.    . Influenza Vac Split High-Dose (FLUZONE HIGH-DOSE) 0.5 ML SUSY     . Multiple Vitamin (MULTIVITAMIN) tablet Take 1 tablet by mouth daily.    Marland Kitchen omeprazole (PRILOSEC) 20 MG capsule Take 20 mg by mouth 2 (two) times daily before a meal.    . Probiotic Product (PROBIOTIC DAILY PO) Take 1 tablet by mouth daily.    . rosuvastatin (CRESTOR) 10 MG tablet Take 1 tablet (10 mg total) by mouth daily. 90 tablet 3  . Vitamin D, Ergocalciferol, (DRISDOL) 50000 units CAPS capsule TAKE 1 CAPSULE WEEKLY. 12 capsule 3  . ZOSTAVAX 19417 UNT/0.65ML injection     . SUMAtriptan (IMITREX) 100 MG tablet TAKE 1 TABLET (100 MG TOTAL) BY MOUTH ONCE. MAY REPEAT IN 2 HOURS IF HEADACHE PERSISTS OR RECURS. 26 tablet 2  . cetirizine (ZYRTEC) 10 MG tablet Take 10 mg by mouth daily.     No  facility-administered medications prior to visit.     PAST MEDICAL HISTORY: Past Medical History:  Diagnosis Date  . Adenomyosis   . Asthma   . Asthma   . Cystocele   . Fibroid   . High cholesterol   . Menopausal symptoms   . Migraine   . Migraine   . Osteopenia 2017   Resolved.  BMD normal in 2017.  Marland Kitchen Rectocele   . Urinary incontinence   . Uterine prolapse     PAST SURGICAL HISTORY: Past Surgical History:  Procedure Laterality Date  . APPENDECTOMY    . BACK SURGERY  1999   L4-L5  . BLADDER SUSPENSION    . COLONOSCOPY  10/2011  . NASAL SINUS SURGERY    . TONSILLECTOMY    . VAGINAL HYSTERECTOMY  2011   Vag. Hyst BSO A/P repair-Sling    FAMILY HISTORY: Family History  Problem Relation Age of Onset  . Diabetes Mother   . Leukemia Mother   . Cancer  Mother        leukemia  . Heart disease Father   . Retinitis pigmentosa Father   . Migraines Father   . Healthy Sister   . Healthy Sister   . Healthy Child   . Healthy Child   . Leukemia Brother     SOCIAL HISTORY: Social History   Socioeconomic History  . Marital status: Married    Spouse name: [name]  . Number of children: 2  . Years of education: 37  . Highest education level: Not on file  Social Needs  . Financial resource strain: Not on file  . Food insecurity - worry: Not on file  . Food insecurity - inability: Not on file  . Transportation needs - medical: Not on file  . Transportation needs - non-medical: Not on file  Occupational History    Employer: [employer]    Comment: [employer]  Tobacco Use  . Smoking status: Former Smoker    Last attempt to quit: 05/08/1972    Years since quitting: 45.4  . Smokeless tobacco: Never Used  Substance and Sexual Activity  . Alcohol use: Yes    Alcohol/week: 1.8 oz    Types: 3 Glasses of wine per week    Comment: occas. wine(twice weekly)  . Drug use: No  . Sexual activity: Yes    Partners: Male    Birth control/protection: Surgical  Other Topics Concern    . Not on file  Social History Narrative   Right handed.  Caffeine 2 cups daily, Married, 2 kids, 5 grand kids.       PHYSICAL EXAM  Vitals:   10/17/17 1317  BP: 111/71  Pulse: 81  Weight: 150 lb (68 kg)  Height: 5\' 3"  (1.6 m)   Body mass index is 26.57 kg/m.  Generalized: Well developed, in no acute distress   Neurological examination  Mentation: Alert oriented to time, place, history taking. Follows all commands speech and language fluent Cranial nerve II-XII: Pupils were equal round reactive to light. Extraocular movements were full, visual field were full on confrontational test. Facial sensation and strength were normal. Uvula tongue midline. Head turning and shoulder shrug  were normal and symmetric. Motor: The motor testing reveals 5 over 5 strength of all 4 extremities. Good symmetric motor tone is noted throughout.  Sensory: Sensory testing is intact to soft touch on all 4 extremities. No evidence of extinction is noted.  Coordination: Cerebellar testing reveals good finger-nose-finger and heel-to-shin bilaterally.  Gait and station: Gait is normal. Tandem gait is normal. Romberg is negative. No drift is seen.  Reflexes: Deep tendon reflexes are symmetric and normal bilaterally.   DIAGNOSTIC DATA (LABS, IMAGING, TESTING) - I reviewed patient records, labs, notes, testing and imaging myself where available.  Lab Results  Component Value Date   WBC 6.9 01/23/2017   HGB 13.9 01/23/2017   HCT 42.3 01/23/2017   MCV 88 01/23/2017   PLT 205 01/23/2017      Component Value Date/Time   NA 141 01/23/2017 0925   K 4.0 01/23/2017 0925   CL 100 01/23/2017 0925   CO2 23 01/23/2017 0925   GLUCOSE 95 01/23/2017 0925   GLUCOSE 88 01/10/2016 0934   BUN 16 01/23/2017 0925   CREATININE 0.95 01/23/2017 0925   CREATININE 0.92 01/10/2016 0934   CALCIUM 9.5 01/23/2017 0925   PROT 6.8 01/10/2016 0934   ALBUMIN 4.1 01/10/2016 0934   AST 27 01/10/2016 0934   AST 27 01/10/2016  0934   ALT  26 01/10/2016 0934   ALKPHOS 47 01/10/2016 0934   BILITOT 0.8 01/10/2016 0934   GFRNONAA 62 01/23/2017 0925   GFRAA 72 01/23/2017 0925   Lab Results  Component Value Date   CHOL 160 05/11/2017   HDL 73 05/11/2017   LDLCALC 104 (H) 01/23/2017   TRIG 65 05/11/2017   CHOLHDL 3.1 01/23/2017  last EKG normal, 03-2017 , Dr Dorris Carnes.      ASSESSMENT AND PLAN 66 y.o. year old female patient , working part time.   1. Migraine headache and Insomnia, both improved on Elavil, 75 mg at night.    She will continue on amitriptyline 75 mg at bedtime.  She reports that her last EKG was normal - her cardiologist was Dr Rollene Fare, now Dorris Carnes.  She will continue on Imitrex as needed. - rarely uses it more than 2 a month. Refilled today  She will follow-up in one year with Np -or sooner if needed.    Larey Seat, MD  10/17/2017, 1:47 PM [facility] [address], [place], [place] [address] [phone]

## 2017-10-19 DIAGNOSIS — Z1231 Encounter for screening mammogram for malignant neoplasm of breast: Secondary | ICD-10-CM | POA: Diagnosis not present

## 2017-11-05 ENCOUNTER — Encounter: Payer: Self-pay | Admitting: Obstetrics and Gynecology

## 2017-12-04 ENCOUNTER — Encounter: Payer: Self-pay | Admitting: Certified Nurse Midwife

## 2017-12-04 ENCOUNTER — Ambulatory Visit (INDEPENDENT_AMBULATORY_CARE_PROVIDER_SITE_OTHER): Payer: PPO | Admitting: Certified Nurse Midwife

## 2017-12-04 ENCOUNTER — Other Ambulatory Visit: Payer: Self-pay

## 2017-12-04 VITALS — BP 110/72 | HR 68 | Temp 97.7°F | Resp 16 | Ht 63.0 in | Wt 150.0 lb

## 2017-12-04 DIAGNOSIS — N952 Postmenopausal atrophic vaginitis: Secondary | ICD-10-CM

## 2017-12-04 DIAGNOSIS — N390 Urinary tract infection, site not specified: Secondary | ICD-10-CM | POA: Diagnosis not present

## 2017-12-04 DIAGNOSIS — R319 Hematuria, unspecified: Secondary | ICD-10-CM | POA: Diagnosis not present

## 2017-12-04 LAB — POCT URINALYSIS DIPSTICK
Bilirubin, UA: NEGATIVE
Glucose, UA: NEGATIVE
Ketones, UA: NEGATIVE
NITRITE UA: POSITIVE
PH UA: 5 (ref 5.0–8.0)
UROBILINOGEN UA: NEGATIVE U/dL — AB

## 2017-12-04 MED ORDER — CIPROFLOXACIN HCL 500 MG PO TABS
500.0000 mg | ORAL_TABLET | Freq: Two times a day (BID) | ORAL | 0 refills | Status: DC
Start: 2017-12-04 — End: 2018-02-18

## 2017-12-04 NOTE — Progress Notes (Signed)
67 y.o. Married Caucasian female [phone] here with complaint of UTI, with onset 4 days. Patient complaining of urinary frequency/urgency/ odorous urine and pain with urination. Patient denies fever, chills, nausea or back pain. No new personal products. Patient feels not related to sexual activity. Denies any vaginal symptoms.. Menopausal with vaginal dryness, not treating. Patient has consuming  adequate water intake, some coffee. No other health issues today. Will be going out of town in 3 days and concerned that she needs to feel better.  ROS Pertinent to HPI  O: Healthy female WDWN Affect: Normal, orientation x 3 Skin : warm and dry CVAT: negative bilateral Abdomen: positive for suprapubic tenderness  Pelvic exam: External genital area: normal, no lesions Bladder,Urethra tender, Urethral meatus: tender, red, prominent, shown area to patient  Vagina: scant to no vaginal discharge, atrophic appearance  Mild cystocele grade 1 noted, no protrusion    Cervix:absent Uterus:absent Adnexa: normal non tender, no fullness or masses   A: UTI Atrophic vaginitis Normal pelvic exam poct urine- wbc 1+, rbc 2+, protein trace, nitrite-+ P: Reviewed findings of UTI and need for treatment. Rx: Cipro see order with instruction ONG:EXBMW micro, culture Reviewed warning signs and symptoms of UTI and need to advise if occurring. Encouraged to limit soda, tea, and coffee and be sure to increase water intake. Discussed vaginal dryness, atrophic vaginitis and etiology and increase risk for UTI when occurring. Discussed OTC treatment which she prefers. Instructions for coconut oil use given. Will advise if no change. Questions addressed.   RV prn

## 2017-12-04 NOTE — Patient Instructions (Signed)

## 2017-12-05 LAB — URINALYSIS, MICROSCOPIC ONLY
Casts: NONE SEEN /lpf
WBC, UA: >30 /hpf — AB (ref 0–?)

## 2017-12-07 LAB — URINE CULTURE

## 2018-02-18 ENCOUNTER — Ambulatory Visit: Payer: PPO | Admitting: Internal Medicine

## 2018-02-18 VITALS — BP 128/70 | HR 73 | Ht 63.0 in | Wt 148.0 lb

## 2018-02-18 DIAGNOSIS — R945 Abnormal results of liver function studies: Secondary | ICD-10-CM

## 2018-02-18 DIAGNOSIS — R7989 Other specified abnormal findings of blood chemistry: Secondary | ICD-10-CM | POA: Diagnosis not present

## 2018-02-18 DIAGNOSIS — E785 Hyperlipidemia, unspecified: Secondary | ICD-10-CM

## 2018-02-18 NOTE — Patient Instructions (Signed)
Medication Instructions:  Your physician recommends that you continue on your current medications as directed. Please refer to the Current Medication list given to you today.   Labwork: Your physician recommends that you return for lab work today (CMET, CBC, TSH, HgA1C, NMR/LIPOPROFILE   Testing/Procedures: none  Follow-Up: Your physician wants you to follow-up in: [place].  You will receive a reminder letter in the mail two months in advance. If you don't receive a letter, please call our office to schedule the follow-up appointment.   Any Other Special Instructions Will Be Listed Below (If Applicable).     If you need a refill on your cardiac medications before your next appointment, please call your pharmacy.

## 2018-02-18 NOTE — Progress Notes (Signed)
Cardiology Office Note   Date:  02/18/2018   ID:  Courtney Horne, DOB Apr 20, 1951, MRN [id number]  PCP:  Patient, No Pcp Per  Cardiologist:   Dorris Carnes, MD    F/U of HL     History of Present Illness: Courtney Horne is a 67 y.o. female with a history of HL, mild elevation of LFTs  Myoview in 2010 normal  LVEF normal     Followed by Marella Chimes in past  COnt to follow with J Medoff    Since I saw her last in clinic she says she has done well  Breathing is good  No CP   St[place] working at [employer] parttime      Current Meds  Medication Sig  . amitriptyline (ELAVIL) 50 MG tablet Take 1.5 tablets (75 mg total) by mouth at bedtime.  . ARNUITY ELLIPTA 200 MCG/ACT AEPB Inhale 1 puff into the lungs as needed.  Marland Kitchen aspirin 81 MG tablet Take 81 mg by mouth daily.  Marland Kitchen estradiol (MINIVELLE) 0.0375 MG/24HR Place 1 patch onto the skin 2 (two) times a week. Apply anywhere on lower abdomen twice weekly.  . fexofenadine (ALLEGRA) 180 MG tablet Take 180 mg by mouth daily.  . Multiple Vitamin (MULTIVITAMIN) tablet Take 1 tablet by mouth daily.  Marland Kitchen omeprazole (PRILOSEC) 20 MG capsule Take 20 mg by mouth 2 (two) times daily before a meal.  . Probiotic Product (PROBIOTIC DAILY PO) Take 1 tablet by mouth daily.  . rosuvastatin (CRESTOR) 10 MG tablet Take 1 tablet (10 mg total) by mouth daily.  . SUMAtriptan (IMITREX) 100 MG tablet Take 100 mg by mouth every 2 (two) hours as needed for migraine. May repeat in 2 hours if headache persists or recurs.  . Vitamin D, Ergocalciferol, (DRISDOL) 50000 units CAPS capsule TAKE 1 CAPSULE WEEKLY.     Allergies:   Codeine; Penicillins; and Zocor [simvastatin]   Past Medical History:  Diagnosis Date  . Adenomyosis   . Asthma   . Asthma   . Cystocele   . Fibroid   . High cholesterol   . Menopausal symptoms   . Migraine   . Migraine   . Osteopenia 2017   Resolved.  BMD normal in 2017.  Marland Kitchen Rectocele   . Urinary incontinence   . Uterine prolapse     Past  Surgical History:  Procedure Laterality Date  . APPENDECTOMY    . BACK SURGERY  1999   L4-L5  . BLADDER SUSPENSION    . COLONOSCOPY  10/2011  . NASAL SINUS SURGERY    . TONSILLECTOMY    . VAGINAL HYSTERECTOMY  2011   Vag. Hyst BSO A/P repair-Sling     Social History:  The patient  reports that she quit smoking about 45 years ago. She has never used smokeless tobacco. She reports that she drinks about 1.8 oz of alcohol per week. She reports that she does not use drugs.   Family History:  The patient's family history includes Cancer in her mother; Diabetes in her mother; Healthy in her child, child, sister, and sister; Heart disease in her father; Leukemia in her brother and mother; Migraines in her father; Retinitis pigmentosa in her father.    ROS:  Please see the history of present illness. All other systems are reviewed and  Negative to the above problem except as noted.    PHYSICAL EXAM: VS:  BP 128/70   Pulse 73   Ht 5\' 3"  (1.6 m)  Wt 148 lb (67.1 kg)   SpO2 98%   BMI 26.22 kg/m   GEN: Well nourished, well developed, in no acute distress  HEENT: normal  Neck: JVP normal  NO, carotid bruits, or masses Cardiac: RRR; no murmurs, rubs, or gallops,no edema  Respiratory:  clear to auscultation bilaterally, normal work of breathing GI: soft, nontender, nondistended, + BS  No hepatomegaly  MS: no deformity Moving all extremities   Skin: warm and dry, no rash Neuro:  Strength and sensation are intact Psych: euthymic mood, full affect   EKG:  EKG is ordered today.SR 63    Lipid Panel    Component Value Date/Time   CHOL 160 05/11/2017 0742   CHOL 184 01/23/2017 0925   TRIG 65 05/11/2017 0742   HDL 73 05/11/2017 0742   CHOLHDL 3.1 01/23/2017 0925   CHOLHDL 2.7 01/10/2016 0934   VLDL 19 01/10/2016 0934   LDLCALC 104 (H) 01/23/2017 0925      Wt Readings from Last 3 Encounters:  02/18/18 148 lb (67.1 kg)  12/04/17 150 lb (68 kg)  10/17/17 150 lb (68 kg)       ASSESSMENT AND PLAN:  1  HL  Will check lipomed   Stay active   She exercises regularly Keep on statin    2  Thyroid  Will recheck TSH  Has been abnormal in past  3  Hx elevated LFTs  Repeat.  4   HCM  Check CBC and A1C    Stay active  F/U 1 yr in clinic     Current medicines are reviewed at length with the patient today.  The patient does not have concerns regarding medicines.  Signed, Dorris Carnes, MD  02/18/2018 9:42 AM    [address], [place], [place]  [address] Phone: [phone]; Fax: [phone]

## 2018-02-19 LAB — NMR, LIPOPROFILE
Cholesterol, Total: 159 mg/dL (ref 100–199)
HDL PARTICLE NUMBER: 45 umol/L (ref >=30.5)
HDL-C: 63 mg/dL (ref >39)
LDL Particle Number: 930 nmol/L (ref <1000)
LDL SIZE: 20.3 nm — AB (ref >20.5)
LDL-C: 72 mg/dL (ref 0–99)
SMALL LDL PARTICLE NUMBER: 521 nmol/L (ref <=527)
Triglycerides: 122 mg/dL (ref 0–149)

## 2018-02-19 LAB — COMPREHENSIVE METABOLIC PANEL WITH GFR
ALT: 39 IU/L — ABNORMAL HIGH (ref 0–32)
AST: 31 IU/L (ref 0–40)
Albumin/Globulin Ratio: 1.8 (ref 1.2–2.2)
Albumin: 4.5 g/dL (ref 3.6–4.8)
Alkaline Phosphatase: 62 IU/L (ref 39–117)
BUN/Creatinine Ratio: 17 (ref 12–28)
BUN: 17 mg/dL (ref 8–27)
Bilirubin Total: 0.8 mg/dL (ref 0.0–1.2)
CO2: 25 mmol/L (ref 20–29)
Calcium: 9.8 mg/dL (ref 8.7–10.3)
Chloride: 100 mmol/L (ref 96–106)
Creatinine, Ser: 0.99 mg/dL (ref 0.57–1.00)
GFR calc Af Amer: 67 mL/min/1.73
GFR calc non Af Amer: 58 mL/min/1.73 — ABNORMAL LOW
Globulin, Total: 2.5 g/dL (ref 1.5–4.5)
Glucose: 88 mg/dL (ref 65–99)
Potassium: 4.4 mmol/L (ref 3.5–5.2)
Sodium: 140 mmol/L (ref 134–144)
Total Protein: 7 g/dL (ref 6.0–8.5)

## 2018-02-19 LAB — CBC
Hematocrit: 43.4 % (ref 34.0–46.6)
Hemoglobin: 14.4 g/dL (ref 11.1–15.9)
MCH: 29.6 pg (ref 26.6–33.0)
MCHC: 33.2 g/dL (ref 31.5–35.7)
MCV: 89 fL (ref 79–97)
Platelets: 268 x10E3/uL (ref 150–379)
RBC: 4.86 x10E6/uL (ref 3.77–5.28)
RDW: 14.4 % (ref 12.3–15.4)
WBC: 9.1 x10E3/uL (ref 3.4–10.8)

## 2018-02-19 LAB — HEMOGLOBIN A1C
Est. average glucose Bld gHb Est-mCnc: 114 mg/dL
Hgb A1c MFr Bld: 5.6 % (ref 4.8–5.6)

## 2018-02-19 LAB — TSH: TSH: 5.7 u[IU]/mL — ABNORMAL HIGH (ref 0.450–4.500)

## 2018-02-19 LAB — APOLIPOPROTEIN B: Apolipoprotein B: 78 mg/dL

## 2018-02-20 ENCOUNTER — Encounter: Payer: Self-pay | Admitting: Internal Medicine

## 2018-02-23 ENCOUNTER — Encounter: Payer: Self-pay | Admitting: Internal Medicine

## 2018-03-06 ENCOUNTER — Other Ambulatory Visit: Payer: Self-pay | Admitting: Internal Medicine

## 2018-03-06 NOTE — Telephone Encounter (Signed)
Will route to Dr. Harrington Challenger.  Last vit D level was Aug 2017.

## 2018-03-06 NOTE — Telephone Encounter (Signed)
Pt's pharmacy is requesting a refill on Vitamin D. Would Dr. Harrington Challenger like to refill this medication? Please advise

## 2018-03-06 NOTE — Telephone Encounter (Signed)
Agree  Get Vit D at her convenience  ? If checked by PCP

## 2018-03-07 ENCOUNTER — Other Ambulatory Visit: Payer: Self-pay | Admitting: *Deleted

## 2018-03-07 ENCOUNTER — Encounter (INDEPENDENT_AMBULATORY_CARE_PROVIDER_SITE_OTHER): Payer: Self-pay

## 2018-03-07 DIAGNOSIS — M8580 Other specified disorders of bone density and structure, unspecified site: Secondary | ICD-10-CM

## 2018-03-07 NOTE — Telephone Encounter (Signed)
MyChart message to patient to schedule appt for vit D level at our office, unless she has had done at PCP, before refilling.

## 2018-03-14 ENCOUNTER — Other Ambulatory Visit: Payer: PPO | Admitting: *Deleted

## 2018-03-14 DIAGNOSIS — M8580 Other specified disorders of bone density and structure, unspecified site: Secondary | ICD-10-CM

## 2018-03-15 ENCOUNTER — Other Ambulatory Visit: Payer: Self-pay | Admitting: *Deleted

## 2018-03-15 ENCOUNTER — Other Ambulatory Visit: Payer: PPO

## 2018-03-15 DIAGNOSIS — M8580 Other specified disorders of bone density and structure, unspecified site: Secondary | ICD-10-CM

## 2018-03-15 LAB — VITAMIN D 25 HYDROXY (VIT D DEFICIENCY, FRACTURES): Vit D, 25-Hydroxy: 76.3 ng/mL (ref 30.0–100.0)

## 2018-03-15 MED ORDER — ERGOCALCIFEROL 50 MCG (2000 UT) PO CAPS: 3000.0000 [IU] | ORAL_CAPSULE | Freq: Every day | ORAL | Status: DC

## 2018-03-15 NOTE — Progress Notes (Signed)
Notes recorded by Fay Records, MD on 03/15/2018 at 10:39 AM EDT Vit D is 76   She could go down to 3000 per day F/U Vit D in 6 months   Vit D 3000 units is OTC medication. Will send MyChart message to patient to give this information.

## 2018-03-25 ENCOUNTER — Telehealth: Payer: Self-pay | Admitting: Neurology

## 2018-03-25 NOTE — Telephone Encounter (Signed)
Pt called requesting a call back to discuss anything that can help with her Migraines pt has tired over the counter medications. Stating her migraine has been on and off for about 25+ days. Scheduled a f/u with NP on 5/21 pt would like options to help with relief until appt.

## 2018-03-25 NOTE — Telephone Encounter (Signed)
Called the patient to offer the recommendations from Dr Brett Fairy. Pt states that she thinks the trigger of her migraines could be related to allergies but she also remembered that nuts would cause her uncle to have migraines. She states that she for breakfast usually has something with nuts in it. She is wandering if that could be the cause and would like to try cutting that out before starting anything at this time. I have mentioned propranolol as a preventative but the patient has asthma and that would not work best for her due to side effects. Dr Dohmeier also mentioned trying topamax and the patient has not attempted that. I also educated on the injections that are available. The patient was appreciative for the call back and for the recommendations. She has an apt on 5/21 and if she doesn't have any relief by taking nuts out of the diet then she will contact us or discuss at upcoming apt.

## 2018-03-25 NOTE — Telephone Encounter (Signed)
Called the patient to offer

## 2018-04-09 ENCOUNTER — Ambulatory Visit: Payer: PPO | Admitting: Adult Health

## 2018-04-09 ENCOUNTER — Encounter: Payer: Self-pay | Admitting: Adult Health

## 2018-04-09 VITALS — BP 116/73 | HR 66 | Ht 63.0 in | Wt 147.0 lb

## 2018-04-09 DIAGNOSIS — G43001 Migraine without aura, not intractable, with status migrainosus: Secondary | ICD-10-CM

## 2018-04-09 MED ORDER — TOPIRAMATE 25 MG PO TABS: 25.0000 mg | ORAL_TABLET | Freq: Every day | ORAL | 1 refills | Status: DC

## 2018-04-09 NOTE — Patient Instructions (Addendum)
Your Plan:  Continue Amitriptyline Consider Topamax 25 mg at bedtime if headaches do not abate after eliminating nuts from your diet. If your symptoms worsen or you develop new symptoms please let us know.    Thank you for coming to see [place] at [facility]. I hope we have been able to provide you high quality care today.  You may receive a patient satisfaction survey over the next few weeks. We would appreciate your feedback and comments so that we may continue to improve ourselves and the health of our patients.  Topiramate tablets What is this medicine? TOPIRAMATE (toe PYRE a mate) is used to treat seizures in adults or children with epilepsy. It is also used for the prevention of migraine headaches. This medicine may be used for other purposes; ask your health care provider or pharmacist if you have questions. COMMON BRAND NAME(S): Topamax, Topiragen What should I tell my health care provider before I take this medicine? They need to know if you have any of these conditions: -bleeding disorders -cirrhosis of the liver or liver disease -diarrhea -glaucoma -kidney stones or kidney disease -low blood counts, like low white cell, platelet, or red cell counts -lung disease like asthma, obstructive pulmonary disease, emphysema -metabolic acidosis -on a ketogenic diet -schedule for surgery or a procedure -suicidal thoughts, plans, or attempt; a previous suicide attempt by you or a family member -an unusual or allergic reaction to topiramate, other medicines, foods, dyes, or preservatives -pregnant or trying to get pregnant -breast-feeding How should I use this medicine? Take this medicine by mouth with a glass of water. Follow the directions on the prescription label. Do not crush or chew. You may take this medicine with meals. Take your medicine at regular intervals. Do not take it more often than directed. Talk to your pediatrician regarding the use of this medicine  in children. Special care may be needed. While this drug may be prescribed for children as young as 78 years of age for selected conditions, precautions do apply. Overdosage: If you think you have taken too much of this medicine contact a poison control center or emergency room at once. NOTE: This medicine is only for you. Do not share this medicine with others. What if I miss a dose? If you miss a dose, take it as soon as you can. If your next dose is to be taken in less than 6 hours, then do not take the missed dose. Take the next dose at your regular time. Do not take double or extra doses. What may interact with this medicine? Do not take this medicine with any of the following medications: -probenecid This medicine may also interact with the following medications: -acetazolamide -alcohol -amitriptyline -aspirin and aspirin-like medicines -birth control pills -certain medicines for depression -certain medicines for seizures -certain medicines that treat or prevent blood clots like warfarin, enoxaparin, dalteparin, apixaban, dabigatran, and rivaroxaban -digoxin -hydrochlorothiazide -lithium -medicines for pain, sleep, or muscle relaxation -metformin -methazolamide -NSAIDS, medicines for pain and inflammation, like ibuprofen or naproxen -pioglitazone -risperidone This list may not describe all possible interactions. Give your health care provider a list of all the medicines, herbs, non-prescription drugs, or dietary supplements you use. Also tell them if you smoke, drink alcohol, or use illegal drugs. Some items may interact with your medicine. What should I watch for while using this medicine? Visit your doctor or health care professional for regular checks on your progress. Do not stop taking this medicine suddenly. This increases the  risk of seizures if you are using this medicine to control epilepsy. Wear a medical identification bracelet or chain to say you have epilepsy or  seizures, and carry a card that lists all your medicines. This medicine can decrease sweating and increase your body temperature. Watch for signs of deceased sweating or fever, especially in children. Avoid extreme heat, hot baths, and saunas. Be careful about exercising, especially in hot weather. Contact your health care provider right away if you notice a fever or decrease in sweating. You should drink plenty of fluids while taking this medicine. If you have had kidney stones in the past, this will help to reduce your chances of forming kidney stones. If you have stomach pain, with nausea or vomiting and yellowing of your eyes or skin, call your doctor immediately. You may get drowsy, dizzy, or have blurred vision. Do not drive, use machinery, or do anything that needs mental alertness until you know how this medicine affects you. To reduce dizziness, do not sit or stand up quickly, especially if you are an older patient. Alcohol can increase drowsiness and dizziness. Avoid alcoholic drinks. If you notice blurred vision, eye pain, or other eye problems, seek medical attention at once for an eye exam. The use of this medicine may increase the chance of suicidal thoughts or actions. Pay special attention to how you are responding while on this medicine. Any worsening of mood, or thoughts of suicide or dying should be reported to your health care professional right away. This medicine may increase the chance of developing metabolic acidosis. If left untreated, this can cause kidney stones, bone disease, or slowed growth in children. Symptoms include breathing fast, fatigue, loss of appetite, irregular heartbeat, or loss of consciousness. Call your doctor immediately if you experience any of these side effects. Also, tell your doctor about any surgery you plan on having while taking this medicine since this may increase your risk for metabolic acidosis. Birth control pills may not work properly while you are  taking this medicine. Talk to your doctor about using an extra method of birth control. Women who become pregnant while using this medicine may enroll in the [place] Pregnancy Registry by calling [phone]. This registry collects information about the safety of antiepileptic drug use during pregnancy. What side effects may I notice from receiving this medicine? Side effects that you should report to your doctor or health care professional as soon as possible: -allergic reactions like skin rash, itching or hives, swelling of the face, lips, or tongue -decreased sweating and/or rise in body temperature -depression -difficulty breathing, fast or irregular breathing patterns -difficulty speaking -difficulty walking or controlling muscle movements -hearing impairment -redness, blistering, peeling or loosening of the skin, including inside the mouth -tingling, pain or numbness in the hands or feet -unusual bleeding or bruising -unusually weak or tired -worsening of mood, thoughts or actions of suicide or dying Side effects that usually do not require medical attention (report to your doctor or health care professional if they continue or are bothersome): -altered taste -back pain, joint or muscle aches and pains -diarrhea, or constipation -headache -loss of appetite -nausea -stomach upset, indigestion -tremors This list may not describe all possible side effects. Call your doctor for medical advice about side effects. You may report side effects to FDA at 1-800-FDA-1088. Where should I keep my medicine? Keep out of the reach of children. Store at room temperature between 15 and 30 degrees C (59 and 86 degrees F)  in a tightly closed container. Protect from moisture. Throw away any unused medicine after the expiration date. NOTE: This sheet is a summary. It may not cover all possible information. If you have questions about this medicine, talk to your doctor,  pharmacist, or health care provider.  2018 Elsevier/Gold Standard (2013-11-10 23:17:57)

## 2018-04-09 NOTE — Progress Notes (Signed)
PATIENT: Courtney Horne DOB: 04-22-1951  REASON FOR VISIT: follow up HISTORY FROM: patient  HISTORY OF PRESENT ILLNESS: Today 04/09/18 Ms. Courtney Horne is a 67 year old female with a history of migraine headaches.  She returns today for follow-up.  She states that she was having a headache daily for approximately 1 month.  She states that when she would use Imitrex her headache will resolve fairly quickly.  She states that her triggers are sunlight, inadequate rest, dehydration and alcohol use.  She recently just discovered that nuts was also causing her headaches.  And her headaches decreased significantly.  She reports that she had nuts this morning as a test and she can feel a headache coming on.  She also states that she is been using Arnuity inhaler daily during allergy season.  She is unsure if this is also contributing to her headaches.  She returns today for evaluation.  She states that she eliminated nuts from her diet for 2 weeks HISTORY 10/17/17 (Copied from Dr. Edwena Horne note): 17 October 2017, I have the pleasure of meeting with Mrs. Courtney Horne today, 67 year old [place], still working part-time.  She endorsed today ongoing relief from insomnia and improvement of migraines at her current regimen of amitriptyline.  We will refill the medication today.  She has regular follow-ups with her cardiologist, which is not Dr. Dorris Horne, and EKGs have never shown a prolonged QT time. Refill today. Paternal family history of heart disease.     REVIEW OF SYSTEMS: Out of a complete 14 system review of symptoms, the patient complains only of the following symptoms, and all other reviewed systems are negative.  Excessive thirst, food allergies  ALLERGIES: Allergies  Allergen Reactions  . Codeine Nausea And Vomiting  . Penicillins Itching and Rash  . Zocor [Simvastatin] Other (See Comments)    HOME MEDICATIONS: Outpatient Medications Prior to Visit  Medication Sig  Dispense Refill  . amitriptyline (ELAVIL) 50 MG tablet Take 1.5 tablets (75 mg total) by mouth at bedtime. 135 tablet 3  . ARNUITY ELLIPTA 200 MCG/ACT AEPB Inhale 1 puff into the lungs as needed.  4  . Ergocalciferol 2000 units CAPS Take 3,000 Units by mouth daily. 30 capsule   . estradiol (MINIVELLE) 0.0375 MG/24HR Place 1 patch onto the skin 2 (two) times a week. Apply anywhere on lower abdomen twice weekly. 8 patch 11  . fexofenadine (ALLEGRA) 180 MG tablet Take 180 mg by mouth daily.    . Multiple Vitamin (MULTIVITAMIN) tablet Take 1 tablet by mouth daily.    Marland Kitchen omeprazole (PRILOSEC) 20 MG capsule Take 20 mg by mouth 2 (two) times daily before a meal.    . Probiotic Product (PROBIOTIC DAILY PO) Take 1 tablet by mouth daily.    . rosuvastatin (CRESTOR) 10 MG tablet Take 1 tablet (10 mg total) by mouth daily. 90 tablet 3  . SUMAtriptan (IMITREX) 100 MG tablet Take 100 mg by mouth every 2 (two) hours as needed for migraine. May repeat in 2 hours if headache persists or recurs.     No facility-administered medications prior to visit.     PAST MEDICAL HISTORY: Past Medical History:  Diagnosis Date  . Adenomyosis   . Asthma   . Asthma   . Cystocele   . Fibroid   . High cholesterol   . Menopausal symptoms   . Migraine   . Migraine   . Osteopenia 2017   Resolved.  BMD normal in 2017.  Marland Kitchen Rectocele   .  Urinary incontinence   . Uterine prolapse     PAST SURGICAL HISTORY: Past Surgical History:  Procedure Laterality Date  . APPENDECTOMY    . BACK SURGERY  1999   L4-L5  . BLADDER SUSPENSION    . COLONOSCOPY  10/2011  . NASAL SINUS SURGERY    . TONSILLECTOMY    . VAGINAL HYSTERECTOMY  2011   Vag. Hyst BSO A/P repair-Sling    FAMILY HISTORY: Family History  Problem Relation Age of Onset  . Diabetes Mother   . Leukemia Mother   . Cancer Mother        leukemia  . Heart disease Father   . Retinitis pigmentosa Father   . Migraines Father   . Healthy Sister   . Healthy  Sister   . Healthy Child   . Healthy Child   . Leukemia Brother     SOCIAL HISTORY: Social History   Socioeconomic History  . Marital status: Married    Spouse name: Courtney Horne  . Number of children: 2  . Years of education: 36  . Highest education level: Not on file  Occupational History    Employer: [employer]    Comment: [employer]  Social Needs  . Financial resource strain: Not on file  . Food insecurity:    Worry: Not on file    Inability: Not on file  . Transportation needs:    Medical: Not on file    Non-medical: Not on file  Tobacco Use  . Smoking status: Former Smoker    Last attempt to quit: 05/08/1972    Years since quitting: 45.9  . Smokeless tobacco: Never Used  . Tobacco comment: in college  Substance and Sexual Activity  . Alcohol use: Yes    Alcohol/week: 1.8 oz    Types: 3 Glasses of wine per week  . Drug use: No  . Sexual activity: Yes    Partners: Male    Birth control/protection: Surgical    Comment: hysterectomy  Lifestyle  . Physical activity:    Days per week: Not on file    Minutes per session: Not on file  . Stress: Not on file  Relationships  . Social connections:    Talks on phone: Not on file    Gets together: Not on file    Attends religious service: Not on file    Active member of club or organization: Not on file    Attends meetings of clubs or organizations: Not on file    Relationship status: Not on file  . Intimate partner violence:    Fear of current or ex partner: Not on file    Emotionally abused: Not on file    Physically abused: Not on file    Forced sexual activity: Not on file  Other Topics Concern  . Not on file  Social History Narrative   Right handed.  Caffeine 2 cups daily, Married, 2 kids, 5 grand kids.       PHYSICAL EXAM  Vitals:   04/09/18 1008  BP: 116/73  Pulse: 66  Weight: 147 lb (66.7 kg)  Height: 5\' 3"  (1.6 m)   Body mass index is 26.04 kg/m.  Generalized: Well developed, in no acute distress    Neurological examination  Mentation: Alert oriented to time, place, history taking. Follows all commands speech and language fluent Cranial nerve II-XII: Pupils were equal round reactive to light. Extraocular movements were full, visual field were full on confrontational test. Facial sensation and strength were normal. Uvula  tongue midline. Head turning and shoulder shrug  were normal and symmetric. Motor: The motor testing reveals 5 over 5 strength of all 4 extremities. Good symmetric motor tone is noted throughout.  Sensory: Sensory testing is intact to soft touch on all 4 extremities. No evidence of extinction is noted.  Coordination: Cerebellar testing reveals good finger-nose-finger and heel-to-shin bilaterally.  Gait and station: Gait is normal. Tandem gait is normal. Romberg is negative. No drift is seen.  Reflexes: Deep tendon reflexes are symmetric and normal bilaterally.   DIAGNOSTIC DATA (LABS, IMAGING, TESTING) - I reviewed patient records, labs, notes, testing and imaging myself where available.  Lab Results  Component Value Date   WBC 9.1 02/18/2018   HGB 14.4 02/18/2018   HCT 43.4 02/18/2018   MCV 89 02/18/2018   PLT 268 02/18/2018      Component Value Date/Time   NA 140 02/18/2018 1010   K 4.4 02/18/2018 1010   CL 100 02/18/2018 1010   CO2 25 02/18/2018 1010   GLUCOSE 88 02/18/2018 1010   GLUCOSE 88 01/10/2016 0934   BUN 17 02/18/2018 1010   CREATININE 0.99 02/18/2018 1010   CREATININE 0.92 01/10/2016 0934   CALCIUM 9.8 02/18/2018 1010   PROT 7.0 02/18/2018 1010   ALBUMIN 4.5 02/18/2018 1010   AST 31 02/18/2018 1010   ALT 39 (H) 02/18/2018 1010   ALKPHOS 62 02/18/2018 1010   BILITOT 0.8 02/18/2018 1010   GFRNONAA 58 (L) 02/18/2018 1010   GFRAA 67 02/18/2018 1010   Lab Results  Component Value Date   CHOL 160 05/11/2017   HDL 73 05/11/2017   LDLCALC 104 (H) 01/23/2017   TRIG 65 05/11/2017   CHOLHDL 3.1 01/23/2017   Lab Results  Component Value Date    HGBA1C 5.6 02/18/2018   No results found for: VITAMINB12 Lab Results  Component Value Date   TSH 5.700 (H) 02/18/2018      ASSESSMENT AND PLAN 67 y.o. year old female  has a past medical history of Adenomyosis, Asthma, Asthma, Cystocele, Fibroid, High cholesterol, Menopausal symptoms, Migraine, Migraine, Osteopenia (2017), Rectocele, Urinary incontinence, and Uterine prolapse. here with:  1.  Migraine headaches  I advised patient that she should eliminate nuts from her diet and see if her headaches continue to reduce.  If she continues to have frequent headaches she can initiate Topamax 25 mg at bedtime.  The patient also plans to talk to her primary care about discontinuing the use of Arnuity to see if this is also beneficial for her headaches.  She would like for me to send a prescriptionof Topamax.  She will put this on hold for now and will use only if her headaches do not improve.  I have reviewed potential side effects of Topamax with the patient.  I have also reviewed potential drug interactions.  She is advised that if her symptoms worsen or she develops new symptoms she should let us know.  She will follow-up in 6 months or sooner if needed.  I spent 15 minutes with the patient. 50% of this time was spent discussing her headache frequency, Topamax and medication interactions.   Ward Givens, MSN, NP-C 04/09/2018, 10:15 AM [facility] [address] [place], [place] [address] [phone]

## 2018-04-12 NOTE — Progress Notes (Signed)
I agree with the assessment and plan as directed by NP .The patient is known to me .   Trinady Milewski, MD  

## 2018-04-17 ENCOUNTER — Other Ambulatory Visit: Payer: Self-pay | Admitting: Internal Medicine

## 2018-04-17 MED ORDER — ROSUVASTATIN CALCIUM 10 MG PO TABS: 10.0000 mg | ORAL_TABLET | Freq: Every day | ORAL | 3 refills | Status: DC

## 2018-04-17 NOTE — Addendum Note (Signed)
Addended by: Derl Barrow on: 04/17/2018 04:11 PM   Modules accepted: Orders

## 2018-04-22 DIAGNOSIS — J3081 Allergic rhinitis due to animal (cat) (dog) hair and dander: Secondary | ICD-10-CM | POA: Diagnosis not present

## 2018-04-22 DIAGNOSIS — J301 Allergic rhinitis due to pollen: Secondary | ICD-10-CM | POA: Diagnosis not present

## 2018-04-22 DIAGNOSIS — J3089 Other allergic rhinitis: Secondary | ICD-10-CM | POA: Diagnosis not present

## 2018-04-22 DIAGNOSIS — J4520 Mild intermittent asthma, uncomplicated: Secondary | ICD-10-CM | POA: Diagnosis not present

## 2018-07-01 DIAGNOSIS — H25013 Cortical age-related cataract, bilateral: Secondary | ICD-10-CM | POA: Diagnosis not present

## 2018-07-01 DIAGNOSIS — H52203 Unspecified astigmatism, bilateral: Secondary | ICD-10-CM | POA: Diagnosis not present

## 2018-07-01 DIAGNOSIS — H524 Presbyopia: Secondary | ICD-10-CM | POA: Diagnosis not present

## 2018-07-01 DIAGNOSIS — H5213 Myopia, bilateral: Secondary | ICD-10-CM | POA: Diagnosis not present

## 2018-07-01 DIAGNOSIS — H2513 Age-related nuclear cataract, bilateral: Secondary | ICD-10-CM | POA: Diagnosis not present

## 2018-07-16 DIAGNOSIS — H25013 Cortical age-related cataract, bilateral: Secondary | ICD-10-CM | POA: Diagnosis not present

## 2018-07-16 DIAGNOSIS — H2513 Age-related nuclear cataract, bilateral: Secondary | ICD-10-CM | POA: Diagnosis not present

## 2018-07-21 HISTORY — PX: CATARACT EXTRACTION, BILATERAL: SHX1313

## 2018-07-24 DIAGNOSIS — H25011 Cortical age-related cataract, right eye: Secondary | ICD-10-CM | POA: Diagnosis not present

## 2018-07-24 DIAGNOSIS — H2512 Age-related nuclear cataract, left eye: Secondary | ICD-10-CM | POA: Diagnosis not present

## 2018-07-24 DIAGNOSIS — H2511 Age-related nuclear cataract, right eye: Secondary | ICD-10-CM | POA: Diagnosis not present

## 2018-07-24 DIAGNOSIS — H25012 Cortical age-related cataract, left eye: Secondary | ICD-10-CM | POA: Diagnosis not present

## 2018-08-07 DIAGNOSIS — H2511 Age-related nuclear cataract, right eye: Secondary | ICD-10-CM | POA: Diagnosis not present

## 2018-08-07 DIAGNOSIS — H2512 Age-related nuclear cataract, left eye: Secondary | ICD-10-CM | POA: Diagnosis not present

## 2018-08-07 DIAGNOSIS — H25012 Cortical age-related cataract, left eye: Secondary | ICD-10-CM | POA: Diagnosis not present

## 2018-08-16 DIAGNOSIS — R82998 Other abnormal findings in urine: Secondary | ICD-10-CM | POA: Diagnosis not present

## 2018-08-16 DIAGNOSIS — E7849 Other hyperlipidemia: Secondary | ICD-10-CM | POA: Diagnosis not present

## 2018-08-23 DIAGNOSIS — J45998 Other asthma: Secondary | ICD-10-CM | POA: Diagnosis not present

## 2018-08-23 DIAGNOSIS — J3089 Other allergic rhinitis: Secondary | ICD-10-CM | POA: Diagnosis not present

## 2018-08-23 DIAGNOSIS — K219 Gastro-esophageal reflux disease without esophagitis: Secondary | ICD-10-CM | POA: Diagnosis not present

## 2018-08-23 DIAGNOSIS — G43909 Migraine, unspecified, not intractable, without status migrainosus: Secondary | ICD-10-CM | POA: Diagnosis not present

## 2018-08-23 DIAGNOSIS — Z6826 Body mass index (BMI) 26.0-26.9, adult: Secondary | ICD-10-CM | POA: Diagnosis not present

## 2018-08-23 DIAGNOSIS — Z1389 Encounter for screening for other disorder: Secondary | ICD-10-CM | POA: Diagnosis not present

## 2018-08-23 DIAGNOSIS — G4709 Other insomnia: Secondary | ICD-10-CM | POA: Diagnosis not present

## 2018-08-23 DIAGNOSIS — Z0000 Encounter for general adult medical examination without abnormal findings: Secondary | ICD-10-CM | POA: Diagnosis not present

## 2018-08-23 DIAGNOSIS — E7849 Other hyperlipidemia: Secondary | ICD-10-CM | POA: Diagnosis not present

## 2018-08-23 DIAGNOSIS — Z780 Asymptomatic menopausal state: Secondary | ICD-10-CM | POA: Diagnosis not present

## 2018-08-30 ENCOUNTER — Telehealth: Payer: Self-pay | Admitting: Internal Medicine

## 2018-08-30 DIAGNOSIS — E785 Hyperlipidemia, unspecified: Secondary | ICD-10-CM

## 2018-08-30 NOTE — Telephone Encounter (Signed)
New Message   Pt c/o medication issue:  1. Name of Medication: aspirin and crestor   2. How are you currently taking this medication (dosage and times per day)?   3. Are you having a reaction (difficulty breathing--STAT)?   4. What is your medication issue? Patient is calling because her PCP wants her to stop the aspirin and increase the crestor. She is wanting Dr. Harrington Challenger opinion before she makes these changes.

## 2018-08-30 NOTE — Telephone Encounter (Signed)
Called pt back. She provided lab values: LDL- 106 HDL- 54 Trigs- 93 Total- 179 Apolipoprotein B- 87  Pt states PCP also mentioned Coronary Calcium Score.  Pt wants to know if Dr. Harrington Challenger feels stopping ASA is appropriate, if increasing Crestor to 20mg  is appropriate and if the Calcium score is necessary?  Pt states having no issues with bleeding or bruising with taking the ASA every other day.  Advised I will send message to Dr. Harrington Challenger for review and advisement.

## 2018-08-30 NOTE — Telephone Encounter (Signed)
Pt states PCP told her the new studies out indicate that she doesn't need to be on ASA at all.  Pt wants to know if Dr. Harrington Challenger feels this is ok.  She states she currently takes ASA 81 every other day.  PCP also told pt her LDL was too high and she needs to double Crestor to 20mg  QD.  She has labs at home but she is out right now.  Advised I would call back later to obtain those numbers from her.

## 2018-09-02 ENCOUNTER — Encounter: Payer: Self-pay | Admitting: Obstetrics and Gynecology

## 2018-09-02 ENCOUNTER — Ambulatory Visit (INDEPENDENT_AMBULATORY_CARE_PROVIDER_SITE_OTHER): Payer: PPO | Admitting: Obstetrics and Gynecology

## 2018-09-02 VITALS — BP 118/76 | HR 64 | Resp 14 | Ht 63.0 in | Wt 150.0 lb

## 2018-09-02 DIAGNOSIS — Z01419 Encounter for gynecological examination (general) (routine) without abnormal findings: Secondary | ICD-10-CM

## 2018-09-02 MED ORDER — ESTRADIOL 0.0375 MG/24HR TD PTTW
1.0000 | MEDICATED_PATCH | TRANSDERMAL | 3 refills | Status: DC
Start: 2018-09-02 — End: 2019-09-08

## 2018-09-02 NOTE — Patient Instructions (Signed)

## 2018-09-02 NOTE — Progress Notes (Signed)
67 y.o. [id number] Married Caucasian female here for annual exam.    Having more vaginal dryness and eye dryness with a reduction in her estrogen dosage.  No hot flashes.   Had cataract surgery.   E Coli Asymptomatic UTI in September 2019 with PCP.  Not treated as she was not having symptoms.  Going to travel in the future - [place], [place], [place] on cruise.   PCP:  Crist Infante, MD   No LMP recorded. Patient has had a hysterectomy.           Sexually active: Yes.    The current method of family planning is status post hysterectomy.    Exercising: Yes.    gym 3x/week and golf. Smoker:  Former  Health Maintenance: Pap:  2011 normal per patient History of abnormal Pap:  no MMG:  10-19-17 3D/density C/Neg/BiRads1 Colonoscopy:  Scheduled 10-10-18 BMD:   10-16-16  Result :normal.  She will do with her PCP at his office.  TDaP: 2013 Gardasil:   no HIV:08-21-16 NR Hep C:08-21-16 Neg Screening Labs:  Hb today: PCP   reports that she quit smoking about 46 years ago. She has never used smokeless tobacco. She reports that she drinks about 2.0 standard drinks of alcohol per week. She reports that she does not use drugs.  Past Medical History:  Diagnosis Date  . Adenomyosis   . Asthma   . Asthma   . Cystocele   . Fibroid   . High cholesterol   . Menopausal symptoms   . Migraine   . Migraine   . Osteopenia 2017   Resolved.  BMD normal in 2017.  Marland Kitchen Rectocele   . Urinary incontinence   . Uterine prolapse     Past Surgical History:  Procedure Laterality Date  . APPENDECTOMY    . BACK SURGERY  1999   L4-L5  . BLADDER SUSPENSION    . CATARACT EXTRACTION, BILATERAL  07/2018  . COLONOSCOPY  10/2011  . NASAL SINUS SURGERY    . TONSILLECTOMY    . VAGINAL HYSTERECTOMY  2011   Vag. Hyst BSO A/P repair-Sling    Current Outpatient Medications  Medication Sig Dispense Refill  . amitriptyline (ELAVIL) 50 MG tablet Take 1.5 tablets (75 mg total) by mouth at bedtime. 135 tablet 3   . ARNUITY ELLIPTA 200 MCG/ACT AEPB Inhale 1 puff into the lungs as needed.  4  . Ergocalciferol 2000 units CAPS Take 3,000 Units by mouth daily. 30 capsule   . estradiol (MINIVELLE) 0.0375 MG/24HR Place 1 patch onto the skin 2 (two) times a week. Apply anywhere on lower abdomen twice weekly. 8 patch 11  . fexofenadine (ALLEGRA) 180 MG tablet Take 180 mg by mouth daily.    . Multiple Vitamin (MULTIVITAMIN) tablet Take 1 tablet by mouth daily.    Marland Kitchen omeprazole (PRILOSEC) 20 MG capsule Take 20 mg by mouth 2 (two) times daily before a meal.    . Probiotic Product (PROBIOTIC DAILY PO) Take 1 tablet by mouth daily.    . rosuvastatin (CRESTOR) 10 MG tablet Take 1 tablet (10 mg total) by mouth daily. 90 tablet 3  . SUMAtriptan (IMITREX) 100 MG tablet Take 100 mg by mouth every 2 (two) hours as needed for migraine. May repeat in 2 hours if headache persists or recurs.     No current facility-administered medications for this visit.     Family History  Problem Relation Age of Onset  . Diabetes Mother   . Leukemia Mother   .  Cancer Mother        leukemia  . Heart disease Father   . Retinitis pigmentosa Father   . Migraines Father   . Healthy Sister   . Healthy Sister   . Healthy Child   . Healthy Child   . Leukemia Brother     Review of Systems  All other systems reviewed and are negative.   Exam:   BP 118/76 (BP Location: Right Arm, Patient Position: Sitting, Cuff Size: Normal)   Pulse 64   Resp 14   Ht 5\' 3"  (1.6 m)   Wt 150 lb (68 kg)   BMI 26.57 kg/m     General appearance: alert, cooperative and appears stated age Head: Normocephalic, without obvious abnormality, atraumatic Neck: no adenopathy, supple, symmetrical, trachea midline and thyroid normal to inspection and palpation Lungs: clear to auscultation bilaterally Breasts: normal appearance, no masses or tenderness, No nipple retraction or dimpling, No nipple discharge or bleeding, No axillary or supraclavicular  adenopathy Heart: regular rate and rhythm Abdomen: soft, non-tender; no masses, no organomegaly Extremities: extremities normal, atraumatic, no cyanosis or edema Skin: Skin color, texture, turgor normal. No rashes or lesions Lymph nodes: Cervical, supraclavicular, and axillary nodes normal. No abnormal inguinal nodes palpated Neurologic: Grossly normal  Pelvic: External genitalia:  no lesions              Urethra:  normal appearing urethra with no masses, tenderness or lesions              Bartholins and Skenes: normal                 Vagina: normal appearing vagina with normal color and discharge, no lesions              Cervix: absent              Pap taken: No. Bimanual Exam:  Uterus:   absent              Adnexa: no mass, fullness, tenderness              Rectal exam: Yes.  .  Confirms.              Anus:  normal sphincter tone, no lesions  Chaperone was present for exam.  Assessment:   Well woman visit with normal exam. ERT patient.  Status post TVH with prolapse repair. Osteopenia. Resolved with last BMD. FH CAD.  Plan: Mammogram screening. Recommended self breast awareness. Pap and HR HPV as above. Guidelines for Calcium, Vitamin D, regular exercise program including cardiovascular and weight bearing exercise. Refill of Minivelle.  Discussed WHI and risks of stroke, DVT, and PE. Follow up annually and prn.   After visit summary provided.

## 2018-09-09 NOTE — Telephone Encounter (Signed)
Yes, I would recomm getting coronary calcdium score   No IV required   Minimal radiation. Will define how aggressive to be with lipids/statin and also Aspirin use  LDL is higher than it was in April

## 2018-09-10 NOTE — Telephone Encounter (Signed)
Left message for patient with details (per DPR)  Adv of Dr. Alan Ripper recommendations below and to call back if she has any questions or concerns, otherwise she should expect call from a scheduler to arrange calcium score CT scan.  Advised of $150 charge due at time of testing.

## 2018-09-10 NOTE — Telephone Encounter (Signed)
Calcium score CT has been scheduled for 11/4.

## 2018-09-23 ENCOUNTER — Ambulatory Visit (INDEPENDENT_AMBULATORY_CARE_PROVIDER_SITE_OTHER)
Admission: RE | Admit: 2018-09-23 | Discharge: 2018-09-23 | Disposition: A | Discharge status: Home / Self Care | Payer: Self-pay | Source: Ambulatory Visit | Attending: Internal Medicine | Admitting: Internal Medicine

## 2018-09-23 DIAGNOSIS — E785 Hyperlipidemia, unspecified: Secondary | ICD-10-CM

## 2018-10-09 ENCOUNTER — Ambulatory Visit (INDEPENDENT_AMBULATORY_CARE_PROVIDER_SITE_OTHER): Payer: PPO | Admitting: Adult Health

## 2018-10-09 ENCOUNTER — Telehealth: Payer: Self-pay | Admitting: *Deleted

## 2018-10-09 ENCOUNTER — Encounter: Payer: Self-pay | Admitting: Adult Health

## 2018-10-09 VITALS — BP 113/63 | HR 68 | Ht 63.0 in | Wt 152.8 lb

## 2018-10-09 DIAGNOSIS — Z8249 Family history of ischemic heart disease and other diseases of the circulatory system: Secondary | ICD-10-CM

## 2018-10-09 DIAGNOSIS — G4700 Insomnia, unspecified: Secondary | ICD-10-CM

## 2018-10-09 DIAGNOSIS — Z0000 Encounter for general adult medical examination without abnormal findings: Secondary | ICD-10-CM

## 2018-10-09 DIAGNOSIS — G43001 Migraine without aura, not intractable, with status migrainosus: Secondary | ICD-10-CM | POA: Diagnosis not present

## 2018-10-09 DIAGNOSIS — E785 Hyperlipidemia, unspecified: Secondary | ICD-10-CM

## 2018-10-09 MED ORDER — ASPIRIN EC 81 MG PO TBEC: 81.0000 mg | DELAYED_RELEASE_TABLET | Freq: Every day | ORAL | 3 refills | Status: DC

## 2018-10-09 MED ORDER — AMITRIPTYLINE HCL 50 MG PO TABS: 75.0000 mg | ORAL_TABLET | Freq: Every day | ORAL | 3 refills | Status: DC

## 2018-10-09 NOTE — Telephone Encounter (Signed)
-----   Message from Fay Records, MD sent at 09/25/2018  2:33 PM EST ----- Reviewed CT scan with pt 1  Pulmonary nodules noted   Small  I would probably f/u in 1 year 2  Mild plaquing noted but in distal LM and mid LAD I would stay on Aspirin   She is taking it every other day due to bruising Keep on Crestor   Will review labs from [facility] that were just done   They seem very diferent by her report than lipomed from APril 2019   She has been eating a little more unhealthy   Not extreme however.

## 2018-10-09 NOTE — Progress Notes (Signed)
PATIENT: Courtney Horne DOB: 03-07-1951  REASON FOR VISIT: follow up HISTORY FROM: patient  HISTORY OF PRESENT ILLNESS: Today 10/09/18:  Courtney Horne is a 67-year-old female with a history of migraine.  She returns today for follow-up.  She reports that her headaches are relatively good control.  She has approximately 1 headache a week.  Her headaches tend to occur on the left side of the face.  She denies photophobia or phonophobia.  She states that she takes Imitrex her headache typically resolves in 15 to 30 minutes.  She notes to that with amitriptyline she was sleeping well but now she is waking up 2-3 times at night.  She returns today for evaluation.  HISTORY  04/09/18 Courtney Horne is a 67 year old female with a history of migraine headaches.  She returns today for follow-up.  She states that she was having a headache daily for approximately 1 month.  She states that when she would use Imitrex her headache will resolve fairly quickly.  She states that her triggers are sunlight, inadequate rest, dehydration and alcohol use.  She recently just discovered that nuts was also causing her headaches.  And her headaches decreased significantly.  She reports that she had nuts this morning as a test and she can feel a headache coming on.  She also states that she is been using Arnuity inhaler daily during allergy season.  She is unsure if this is also contributing to her headaches.  She returns today for evaluation.   REVIEW OF SYSTEMS: Out of a complete 14 system review of symptoms, the patient complains only of the following symptoms, and all other reviewed systems are negative.  See HPI  ALLERGIES: Allergies  Allergen Reactions  . Codeine Nausea And Vomiting  . Penicillins Itching and Rash  . Zocor [Simvastatin] Other (See Comments)    HOME MEDICATIONS: Outpatient Medications Prior to Visit  Medication Sig Dispense Refill  . amitriptyline (ELAVIL) 50 MG tablet Take 1.5 tablets (75  mg total) by mouth at bedtime. 135 tablet 3  . beclomethasone (QVAR) 80 MCG/ACT inhaler Inhale into the lungs 2 (two) times daily. Uses seasonal    . Ergocalciferol 2000 units CAPS Take 3,000 Units by mouth daily. 30 capsule   . estradiol (MINIVELLE) 0.0375 MG/24HR Place 1 patch onto the skin 2 (two) times a week. Apply anywhere on lower abdomen twice weekly. 24 patch 3  . fexofenadine (ALLEGRA) 180 MG tablet Take 180 mg by mouth daily.    . Multiple Vitamin (MULTIVITAMIN) tablet Take 1 tablet by mouth daily.    Marland Kitchen omeprazole (PRILOSEC) 20 MG capsule Take 20 mg by mouth 2 (two) times daily before a meal.    . Probiotic Product (PROBIOTIC DAILY PO) Take 1 tablet by mouth daily.    . rosuvastatin (CRESTOR) 10 MG tablet Take 1 tablet (10 mg total) by mouth daily. 90 tablet 3  . SUMAtriptan (IMITREX) 100 MG tablet Take 100 mg by mouth every 2 (two) hours as needed for migraine. May repeat in 2 hours if headache persists or recurs.    . ARNUITY ELLIPTA 200 MCG/ACT AEPB Inhale 1 puff into the lungs as needed.  4   No facility-administered medications prior to visit.     PAST MEDICAL HISTORY: Past Medical History:  Diagnosis Date  . Adenomyosis   . Asthma   . Asthma   . Cystocele   . Fibroid   . High cholesterol   . Menopausal symptoms   . Migraine   .  Migraine   . Osteopenia 2017   Resolved.  BMD normal in 2017.  Marland Kitchen Rectocele   . Urinary incontinence   . Uterine prolapse     PAST SURGICAL HISTORY: Past Surgical History:  Procedure Laterality Date  . APPENDECTOMY    . BACK SURGERY  1999   L4-L5  . BLADDER SUSPENSION    . CATARACT EXTRACTION, BILATERAL  07/2018  . COLONOSCOPY  10/2011  . NASAL SINUS SURGERY    . TONSILLECTOMY    . VAGINAL HYSTERECTOMY  2011   Vag. Hyst BSO A/P repair-Sling    FAMILY HISTORY: Family History  Problem Relation Age of Onset  . Diabetes Mother   . Leukemia Mother   . Cancer Mother        leukemia  . Heart disease Father   . Retinitis  pigmentosa Father   . Migraines Father   . Healthy Sister   . Healthy Sister   . Healthy Child   . Healthy Child   . Leukemia Brother     SOCIAL HISTORY: Social History   Socioeconomic History  . Marital status: Married    Spouse name: [name]  . Number of children: 2  . Years of education: 63  . Highest education level: Not on file  Occupational History    Employer: [employer]    Comment: [employer]  Social Needs  . Financial resource strain: Not on file  . Food insecurity:    Worry: Not on file    Inability: Not on file  . Transportation needs:    Medical: Not on file    Non-medical: Not on file  Tobacco Use  . Smoking status: Former Smoker    Last attempt to quit: 05/08/1972    Years since quitting: 46.4  . Smokeless tobacco: Never Used  . Tobacco comment: in college  Substance and Sexual Activity  . Alcohol use: Yes    Alcohol/week: 2.0 standard drinks    Types: 2 Glasses of wine per week  . Drug use: No  . Sexual activity: Yes    Partners: Male    Birth control/protection: Surgical    Comment: hysterectomy  Lifestyle  . Physical activity:    Days per week: Not on file    Minutes per session: Not on file  . Stress: Not on file  Relationships  . Social connections:    Talks on phone: Not on file    Gets together: Not on file    Attends religious service: Not on file    Active member of club or organization: Not on file    Attends meetings of clubs or organizations: Not on file    Relationship status: Not on file  . Intimate partner violence:    Fear of current or ex partner: Not on file    Emotionally abused: Not on file    Physically abused: Not on file    Forced sexual activity: Not on file  Other Topics Concern  . Not on file  Social History Narrative   Right handed.  Caffeine 2 cups daily, Married, 2 kids, 5 grand kids.       PHYSICAL EXAM  Vitals:   10/09/18 0757  BP: 113/63  Pulse: 68  Weight: 152 lb 12.8 oz (69.3 kg)  Height: 5\' 3"  (1.6  m)   Body mass index is 27.07 kg/m.  Generalized: Well developed, in no acute distress   Neurological examination  Mentation: Alert oriented to time, place, history taking. Follows all commands speech and  language fluent Cranial nerve II-XII: Pupils were equal round reactive to light. Extraocular movements were full, visual field were full on confrontational test. Facial sensation and strength were normal. Uvula tongue midline. Head turning and shoulder shrug  were normal and symmetric. Motor: The motor testing reveals 5 over 5 strength of all 4 extremities. Good symmetric motor tone is noted throughout.  Sensory: Sensory testing is intact to soft touch on all 4 extremities. No evidence of extinction is noted.  Coordination: Cerebellar testing reveals good finger-nose-finger and heel-to-shin bilaterally.  Gait and station: Gait is normal. Tandem gait is normal. Romberg is negative. No drift is seen.  Reflexes: Deep tendon reflexes are symmetric and normal bilaterally.   DIAGNOSTIC DATA (LABS, IMAGING, TESTING) - I reviewed patient records, labs, notes, testing and imaging myself where available.  Lab Results  Component Value Date   WBC 9.1 02/18/2018   HGB 14.4 02/18/2018   HCT 43.4 02/18/2018   MCV 89 02/18/2018   PLT 268 02/18/2018      Component Value Date/Time   NA 140 02/18/2018 1010   K 4.4 02/18/2018 1010   CL 100 02/18/2018 1010   CO2 25 02/18/2018 1010   GLUCOSE 88 02/18/2018 1010   GLUCOSE 88 01/10/2016 0934   BUN 17 02/18/2018 1010   CREATININE 0.99 02/18/2018 1010   CREATININE 0.92 01/10/2016 0934   CALCIUM 9.8 02/18/2018 1010   PROT 7.0 02/18/2018 1010   ALBUMIN 4.5 02/18/2018 1010   AST 31 02/18/2018 1010   ALT 39 (H) 02/18/2018 1010   ALKPHOS 62 02/18/2018 1010   BILITOT 0.8 02/18/2018 1010   GFRNONAA 58 (L) 02/18/2018 1010   GFRAA 67 02/18/2018 1010   Lab Results  Component Value Date   CHOL 160 05/11/2017   HDL 73 05/11/2017   LDLCALC 104 (H)  01/23/2017   TRIG 65 05/11/2017   CHOLHDL 3.1 01/23/2017   Lab Results  Component Value Date   HGBA1C 5.6 02/18/2018   No results found for: VITAMINB12 Lab Results  Component Value Date   TSH 5.700 (H) 02/18/2018      ASSESSMENT AND PLAN 67 y.o. year old female  has a past medical history of Adenomyosis, Asthma, Asthma, Cystocele, Fibroid, High cholesterol, Menopausal symptoms, Migraine, Migraine, Osteopenia (2017), Rectocele, Urinary incontinence, and Uterine prolapse. here with:  1.  Migraine headache  Overall the patient is doing well.  She will continue on amitriptyline 75 mg at bedtime.  We discussed sleep hygiene and discussed things that she can change to promote better sleep.  If her headache frequency increases she should let us know.  She will follow-up in 6 months or sooner if needed.   I spent 15 minutes with the patient. 50% of this time was spent reviewing plan of care   Ward Givens, MSN, NP-C 10/09/2018, 8:03 AM [facility] [address], [place], [place] [address] [phone]

## 2018-10-09 NOTE — Patient Instructions (Addendum)
Your Plan:  Continue Amitriptyline and Imitrex If your symptoms worsen or you develop new symptoms please let us know.   Thank you for coming to see [place] at [facility]. I hope we have been able to provide you high quality care today.  You may receive a patient satisfaction survey over the next few weeks. We would appreciate your feedback and comments so that we may continue to improve ourselves and the health of our patients.

## 2018-10-09 NOTE — Telephone Encounter (Signed)
Aspirin added to patient's medication list. Labs are scanned in from 08/16/18 from Dr. Joylene Draft. (lipids).

## 2018-10-09 NOTE — Progress Notes (Signed)
I have read the note, and I agree with the clinical assessment and plan.  Courtney Horne   

## 2018-10-21 ENCOUNTER — Ambulatory Visit: Payer: PPO | Admitting: Adult Health

## 2018-10-21 NOTE — Telephone Encounter (Signed)
Pt should have lipomed checked again after the new year.   Lipids in Sept were very different than previous

## 2018-10-22 ENCOUNTER — Other Ambulatory Visit: Payer: Self-pay | Admitting: *Deleted

## 2018-10-22 NOTE — Addendum Note (Signed)
Addended by: Stephani Police on: 10/22/2018 01:58 PM   Modules accepted: Orders

## 2018-10-22 NOTE — Telephone Encounter (Signed)
Pt advised Dr. Harrington Challenger' recommendations to have a Lipomed after the first of the year.. She has several other appts scheduled and will come in around 12/17/18.

## 2018-11-25 ENCOUNTER — Ambulatory Visit: Payer: PPO | Admitting: Adult Health

## 2018-11-25 ENCOUNTER — Encounter: Payer: Self-pay | Admitting: Obstetrics and Gynecology

## 2018-11-25 DIAGNOSIS — Z1231 Encounter for screening mammogram for malignant neoplasm of breast: Secondary | ICD-10-CM | POA: Diagnosis not present

## 2018-11-26 DIAGNOSIS — Z1211 Encounter for screening for malignant neoplasm of colon: Secondary | ICD-10-CM | POA: Diagnosis not present

## 2018-11-28 DIAGNOSIS — Z13820 Encounter for screening for osteoporosis: Secondary | ICD-10-CM | POA: Diagnosis not present

## 2018-12-02 DIAGNOSIS — Z961 Presence of intraocular lens: Secondary | ICD-10-CM | POA: Diagnosis not present

## 2018-12-09 DIAGNOSIS — J3089 Other allergic rhinitis: Secondary | ICD-10-CM | POA: Diagnosis not present

## 2018-12-09 DIAGNOSIS — J4520 Mild intermittent asthma, uncomplicated: Secondary | ICD-10-CM | POA: Diagnosis not present

## 2018-12-09 DIAGNOSIS — J3081 Allergic rhinitis due to animal (cat) (dog) hair and dander: Secondary | ICD-10-CM | POA: Diagnosis not present

## 2018-12-09 DIAGNOSIS — J301 Allergic rhinitis due to pollen: Secondary | ICD-10-CM | POA: Diagnosis not present

## 2018-12-18 ENCOUNTER — Other Ambulatory Visit: Payer: PPO | Admitting: *Deleted

## 2018-12-18 DIAGNOSIS — R7989 Other specified abnormal findings of blood chemistry: Secondary | ICD-10-CM

## 2018-12-18 DIAGNOSIS — M8580 Other specified disorders of bone density and structure, unspecified site: Secondary | ICD-10-CM

## 2018-12-18 DIAGNOSIS — E785 Hyperlipidemia, unspecified: Secondary | ICD-10-CM

## 2018-12-18 DIAGNOSIS — Z8249 Family history of ischemic heart disease and other diseases of the circulatory system: Secondary | ICD-10-CM | POA: Diagnosis not present

## 2018-12-18 DIAGNOSIS — Z0000 Encounter for general adult medical examination without abnormal findings: Secondary | ICD-10-CM | POA: Diagnosis not present

## 2018-12-19 LAB — APOLIPOPROTEIN B: APOLIPOPROTEIN B: 76 mg/dL (ref <90)

## 2018-12-19 LAB — NMR, LIPOPROFILE
CHOLESTEROL, TOTAL: 155 mg/dL (ref 100–199)
HDL PARTICLE NUMBER: 36.9 umol/L (ref >=30.5)
HDL-C: 52 mg/dL (ref >39)
LDL Particle Number: 1227 nmol/L — ABNORMAL HIGH (ref <1000)
LDL SIZE: 20.9 nm (ref >20.5)
LDL-C: 89 mg/dL (ref 0–99)
LP-IR Score: 34 (ref <=45)
SMALL LDL PARTICLE NUMBER: 695 nmol/L — AB (ref <=527)
TRIGLYCERIDES: 68 mg/dL (ref 0–149)

## 2018-12-19 LAB — VITAMIN D 25 HYDROXY (VIT D DEFICIENCY, FRACTURES): Vit D, 25-Hydroxy: 55.5 ng/mL (ref 30.0–100.0)

## 2018-12-19 LAB — LIPOPROTEIN A (LPA): Lipoprotein (a): 10.6 nmol/L (ref <75.0)

## 2018-12-27 ENCOUNTER — Telehealth: Payer: Self-pay | Admitting: Internal Medicine

## 2018-12-27 MED ORDER — ROSUVASTATIN CALCIUM 20 MG PO TABS: 20.0000 mg | ORAL_TABLET | Freq: Every day | ORAL | 3 refills | Status: DC

## 2018-12-27 NOTE — Telephone Encounter (Signed)
Called patient about Dr. Harrington Challenger' message. Patient verbalized understanding. Sent in prescription for Crestor 20 mg daily.

## 2018-12-27 NOTE — Addendum Note (Signed)
Addended by: Aris Georgia, Glennie Rodda L on: 12/27/2018 11:00 AM   Modules accepted: Orders

## 2018-12-27 NOTE — Telephone Encounter (Signed)
Reviewed with lipid clinic   Go ahead and increase Crestor back to 20 mg    Confirm that pt has enough for refills

## 2018-12-30 DIAGNOSIS — J209 Acute bronchitis, unspecified: Secondary | ICD-10-CM | POA: Diagnosis not present

## 2018-12-30 DIAGNOSIS — J4520 Mild intermittent asthma, uncomplicated: Secondary | ICD-10-CM | POA: Diagnosis not present

## 2018-12-30 DIAGNOSIS — J301 Allergic rhinitis due to pollen: Secondary | ICD-10-CM | POA: Diagnosis not present

## 2018-12-30 DIAGNOSIS — J3089 Other allergic rhinitis: Secondary | ICD-10-CM | POA: Diagnosis not present

## 2019-04-04 DIAGNOSIS — B029 Zoster without complications: Secondary | ICD-10-CM | POA: Diagnosis not present

## 2019-04-04 DIAGNOSIS — R51 Headache: Secondary | ICD-10-CM | POA: Diagnosis not present

## 2019-04-11 DIAGNOSIS — Z03818 Encounter for observation for suspected exposure to other biological agents ruled out: Secondary | ICD-10-CM | POA: Diagnosis not present

## 2019-04-21 ENCOUNTER — Other Ambulatory Visit: Payer: Self-pay | Admitting: Neurology

## 2019-04-21 MED ORDER — SUMATRIPTAN SUCCINATE 100 MG PO TABS: 100.0000 mg | ORAL_TABLET | ORAL | 5 refills | Status: DC | PRN

## 2019-06-22 IMAGING — CT CT HEART SCORING
2 series · 16 of 20 positions shown, 18 images · non-contrast
Comparison: None.

CLINICAL DATA: Risk stratification

EXAM:
Coronary Calcium Score
TECHNIQUE: The patient was scanned on a Siemens Somatom 64 slice scanner. Axial
non-contrast 3 mm slices were carried out through the heart. The
data set was analyzed on a dedicated work station and scored using
the Agatson method.

[Series 3: casc 3.0 i36f 2 bestdiast 70 % · axial · 0.32mm/px · z∈[+1166,+1256]mm · 8 of 40 slices shown, 10 images]
[im 5/40  vessel]
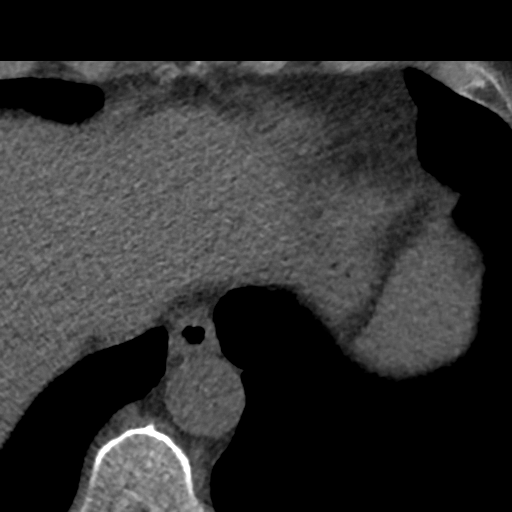
[im 5/40  lung]
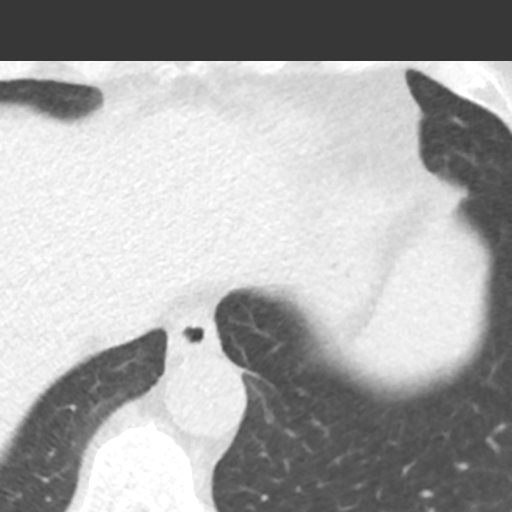
[im 9/40  vessel]
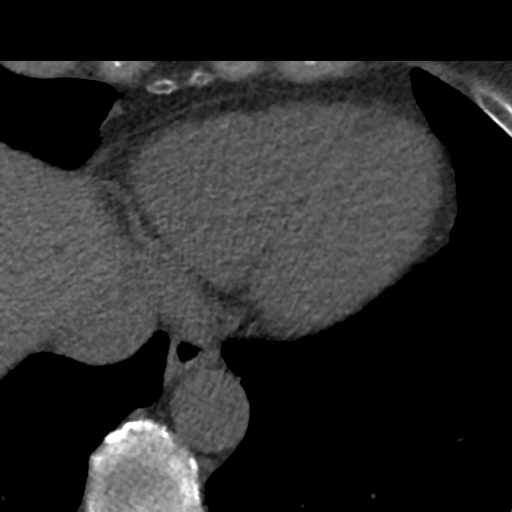
[im 14/40  vessel]
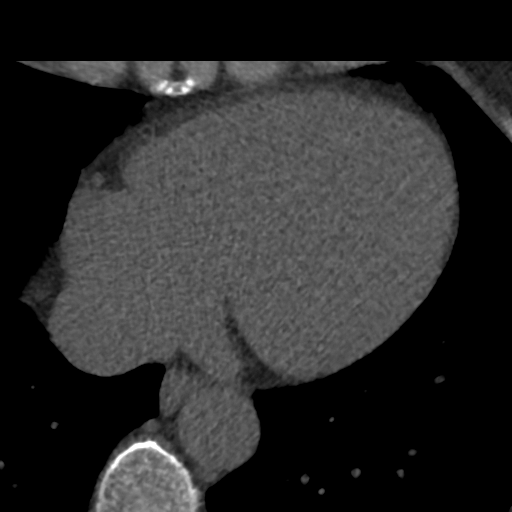
[im 18/40  vessel]
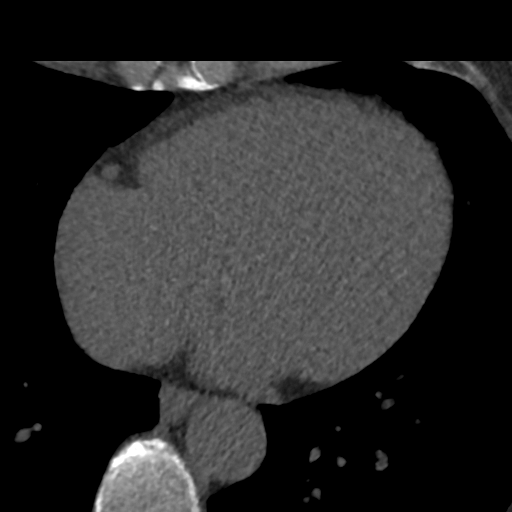
[im 22/40  vessel]
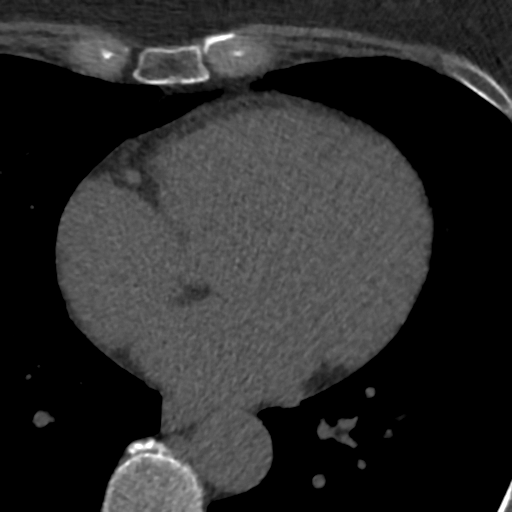
[im 22/40  lung]
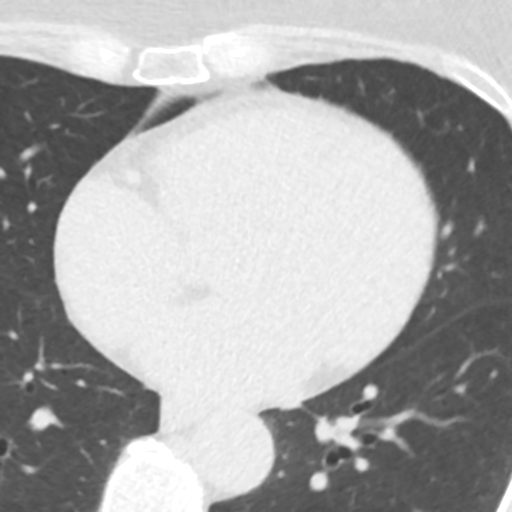
[im 27/40  vessel]
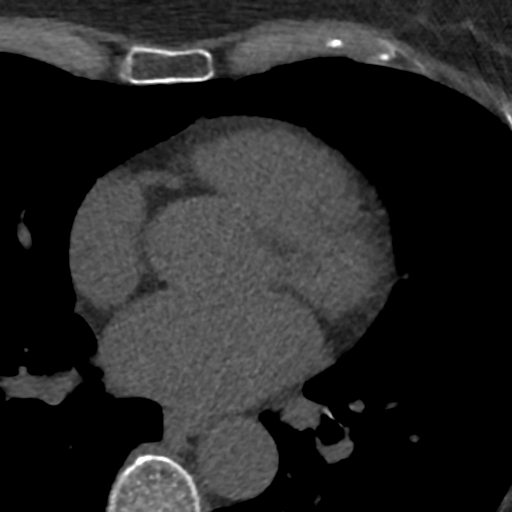
[im 31/40  vessel]
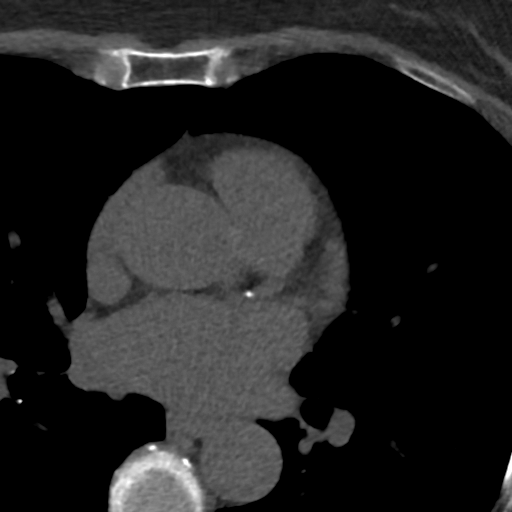
[im 35/40  vessel]
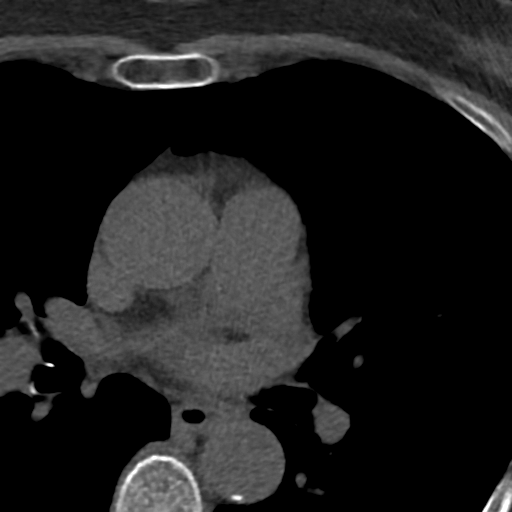

[Series 5: lung st 71 % · axial · 0.61mm/px · z∈[+1166,+1256]mm · 8 of 40 slices shown]
[im 5/40  lung]
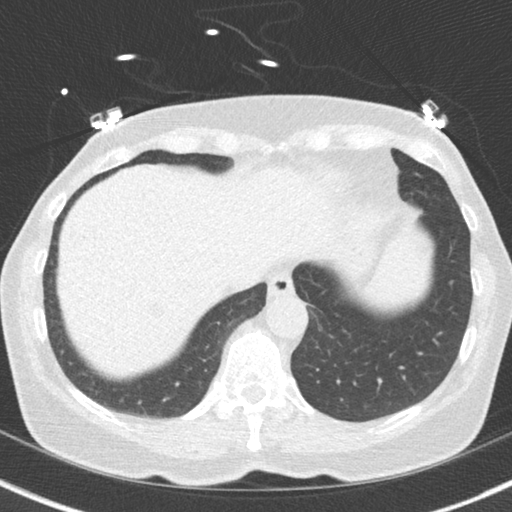
[im 9/40  lung]
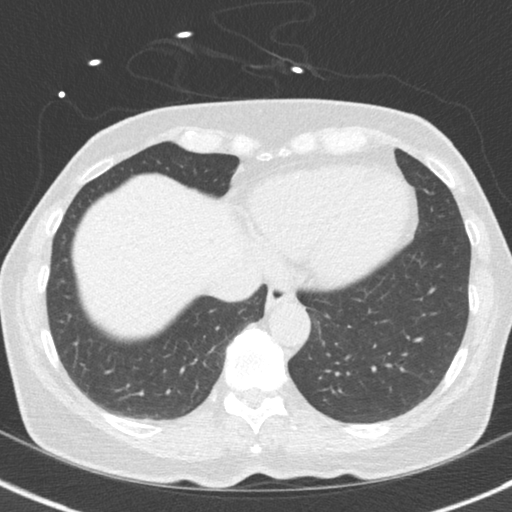
[im 14/40  lung]
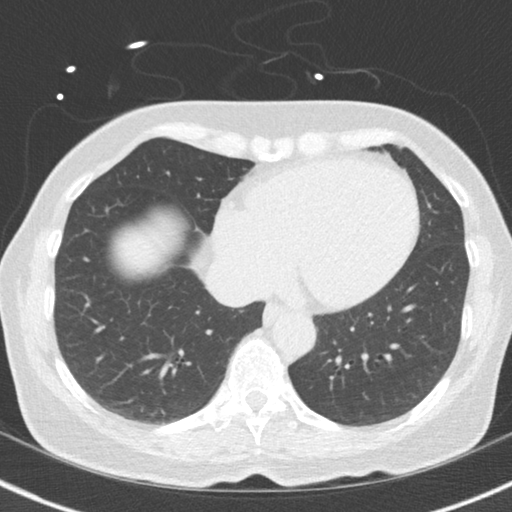
[im 18/40  lung]
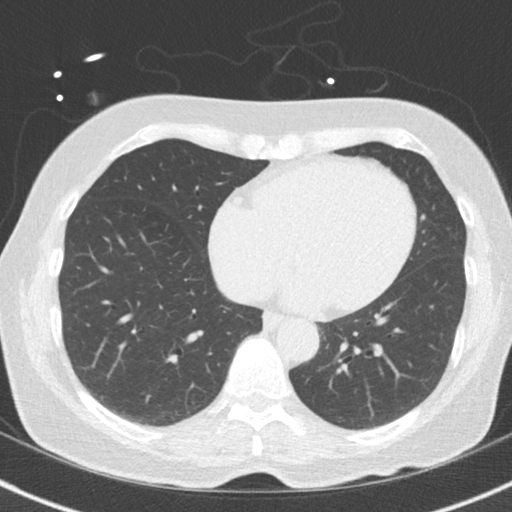
[im 22/40  lung]
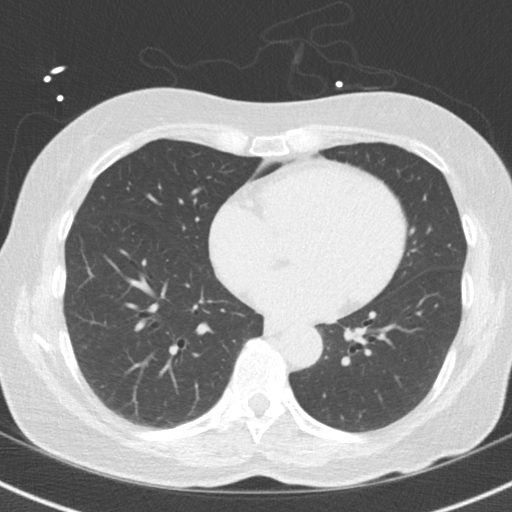
[im 27/40  lung]
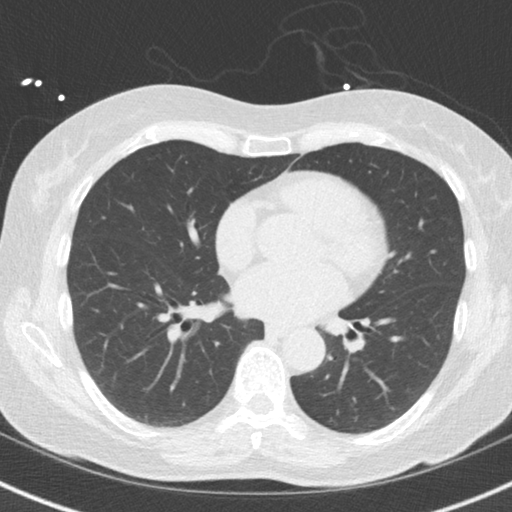
[im 31/40  lung]
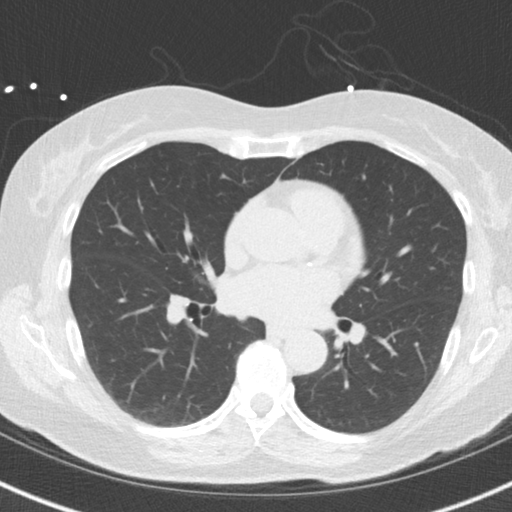
[im 35/40  lung]
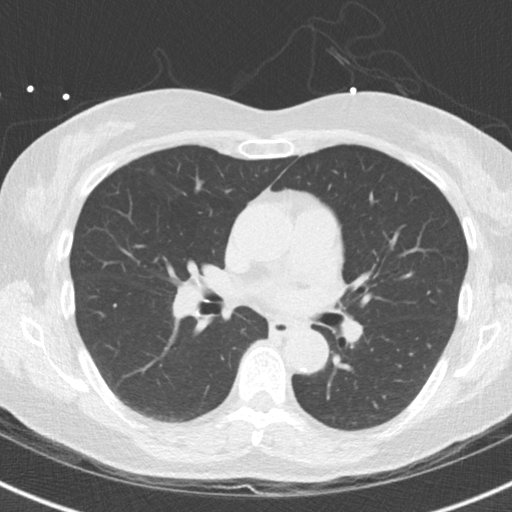

[16 of 20 positions shown; findings below may reference images not displayed]

FINDINGS: Non-cardiac: See separate report from [REDACTED].

Ascending aorta: Normal diameter 3.6 cm

Pericardium: Normal

Coronary arteries: Calcium noted in distal LM and mid LAD
IMPRESSION: Coronary calcium score of 18. This was 55 th percentile for age and
sex matched control.

Braeden Witten

EXAM:
OVER-READ INTERPRETATION  CT CHEST

The following report is an over-read performed by radiologist Dr.
M Hamza Tiger [REDACTED] on 09/23/2018. This over-read
does not include interpretation of cardiac or coronary anatomy or
pathology. The coronary calcium score/coronary CTA interpretation by
the cardiologist is attached.
FINDINGS: Vascular: Atherosclerotic calcifications in descending thoracic
aorta. Visualized thoracic aorta has normal caliber.

Mediastinum/Nodes: Visualized mediastinal and hilar structures are
unremarkable.

Lungs/Pleura: In the right middle lobe there is a 6 x 4 mm nodule (5
mm mean diameter) on image 4 of series 7. This nodule is
pleural-based along the right minor fissure. Punctate nodule in the
right lower lobe on sequence 4, image 21. 4 mm nodule in the left
lower lobe on sequence 4, image 8. 2 mm nodule in left lower lobe on
image 35. Additional small nodule in the left lower lobe on image
20. No large pleural effusions. No significant lung consolidation or
airspace disease.

Upper Abdomen: Scattered low-density structures in the visualized
liver probably represent small hepatic cysts.

Musculoskeletal: Unremarkable.
IMPRESSION: 1. No acute abnormality.
2. Several small pulmonary nodules. Largest pulmonary nodule has a
mean diameter of 5 mm in the right middle lobe. No follow-up needed
if patient is low-risk (and has no known or suspected primary
neoplasm). Non-contrast chest CT can be considered in 12 months if
patient is high-risk. This recommendation follows the consensus
statement: Guidelines for Management of Incidental Pulmonary Nodules
Detected on CT Images: From the [HOSPITAL] 7507; Radiology
3. Probable hepatic cysts.
4.  Aortic Atherosclerosis ([id number]-COT.T).

## 2019-07-16 ENCOUNTER — Telehealth: Payer: Self-pay | Admitting: Internal Medicine

## 2019-07-16 DIAGNOSIS — R7989 Other specified abnormal findings of blood chemistry: Secondary | ICD-10-CM

## 2019-07-16 DIAGNOSIS — Z8249 Family history of ischemic heart disease and other diseases of the circulatory system: Secondary | ICD-10-CM

## 2019-07-16 DIAGNOSIS — M8580 Other specified disorders of bone density and structure, unspecified site: Secondary | ICD-10-CM

## 2019-07-16 DIAGNOSIS — E785 Hyperlipidemia, unspecified: Secondary | ICD-10-CM

## 2019-07-16 NOTE — Telephone Encounter (Signed)
New message:     Patient calling to have a order in for labs before her appt in 11/20.

## 2019-07-18 DIAGNOSIS — K219 Gastro-esophageal reflux disease without esophagitis: Secondary | ICD-10-CM | POA: Diagnosis not present

## 2019-07-24 NOTE — Telephone Encounter (Signed)
Will route to Dr. Harrington Challenger for recommendation on ordering labs prior to next appointment. Pt seen 02/2018 - had Vit D, CMET, A1C, TSH, CBC and NMR profile done.  Then Jan 2020 had NMR and Vit D.  Appointment with Dr. Harrington Challenger is November 20.

## 2019-07-29 NOTE — Telephone Encounter (Signed)
Get them all since it will be close to 1 year

## 2019-08-04 NOTE — Addendum Note (Signed)
Addended by: Rodman Key on: 08/04/2019 08:38 AM   Modules accepted: Orders

## 2019-08-04 NOTE — Telephone Encounter (Signed)
Lab orders placed.  MyChart message to patient informing.

## 2019-09-04 ENCOUNTER — Other Ambulatory Visit: Payer: Self-pay

## 2019-09-08 ENCOUNTER — Other Ambulatory Visit: Payer: Self-pay

## 2019-09-08 ENCOUNTER — Encounter: Payer: Self-pay | Admitting: Obstetrics and Gynecology

## 2019-09-08 ENCOUNTER — Ambulatory Visit (INDEPENDENT_AMBULATORY_CARE_PROVIDER_SITE_OTHER): Payer: PPO | Admitting: Obstetrics and Gynecology

## 2019-09-08 VITALS — BP 110/70 | HR 80 | Temp 97.3°F | Resp 12 | Ht 63.0 in | Wt 150.0 lb

## 2019-09-08 DIAGNOSIS — Z01419 Encounter for gynecological examination (general) (routine) without abnormal findings: Secondary | ICD-10-CM

## 2019-09-08 MED ORDER — ESTRADIOL 0.0375 MG/24HR TD PTTW: 1.0000 | MEDICATED_PATCH | TRANSDERMAL | 3 refills | Status: DC

## 2019-09-08 NOTE — Patient Instructions (Signed)

## 2019-09-08 NOTE — Progress Notes (Signed)
68 y.o. [id number] Married Caucasian female here for annual exam.    Still using her transdermal estrogen.  Feeling dryness issues, eyes and mouth.  Bruised easily and she stopped her daily ASA.  She will see her cardiologist, Dr. Harrington Challenger.   Doing ok during the pandemic.  PCP: Crist Infante, MD     No LMP recorded. Patient has had a hysterectomy.           Sexually active: Yes.    The current method of family planning is status post hysterectomy.    Exercising: Yes.    golf, walking Smoker:  Former  Health Maintenance: Pap:  2011 Normal  History of abnormal Pap:  no MMG:  11/25/18 BIRADS 1 negative/density c Colonoscopy:  November 2019 -- f/u 10years per patient BMD:   10/16/16  Result  Normal TDaP:  2013 Gardasil:   no HIV and Hep C: 08/21/16 Negative Screening Labs: PCP Flu vaccine:  Completed.    reports that she quit smoking about 47 years ago. She has never used smokeless tobacco. She reports current alcohol use of about 2.0 standard drinks of alcohol per week. She reports that she does not use drugs.  Past Medical History:  Diagnosis Date  . Adenomyosis   . Asthma   . Asthma   . Cystocele   . Fibroid   . High cholesterol   . Menopausal symptoms   . Migraine   . Migraine   . Osteopenia 2017   Resolved.  BMD normal in 2017.  Marland Kitchen Rectocele   . Urinary incontinence   . Uterine prolapse     Past Surgical History:  Procedure Laterality Date  . APPENDECTOMY    . BACK SURGERY  1999   L4-L5  . BLADDER SUSPENSION    . CATARACT EXTRACTION, BILATERAL  07/2018  . COLONOSCOPY  10/2011  . NASAL SINUS SURGERY    . TONSILLECTOMY    . VAGINAL HYSTERECTOMY  2011   Vag. Hyst BSO A/P repair-Sling    Current Outpatient Medications  Medication Sig Dispense Refill  . amitriptyline (ELAVIL) 50 MG tablet Take 1.5 tablets (75 mg total) by mouth at bedtime. 135 tablet 3  . Biotin 5000 MCG TABS     . budesonide-formoterol (SYMBICORT) 160-4.5 MCG/ACT inhaler Inhale into the lungs.     . Ergocalciferol 2000 units CAPS Take 3,000 Units by mouth daily. 30 capsule   . estradiol (MINIVELLE) 0.0375 MG/24HR Place 1 patch onto the skin 2 (two) times a week. Apply anywhere on lower abdomen twice weekly. 24 patch 3  . fexofenadine (ALLEGRA) 180 MG tablet Take 180 mg by mouth daily.    . Multiple Vitamin (MULTIVITAMIN) tablet Take 1 tablet by mouth daily.    Marland Kitchen omeprazole (PRILOSEC) 10 MG capsule Take by mouth.    . Probiotic Product (PROBIOTIC DAILY PO) Take 1 tablet by mouth daily.    . rosuvastatin (CRESTOR) 20 MG tablet Take 1 tablet (20 mg total) by mouth daily. 90 tablet 3  . SUMAtriptan (IMITREX) 100 MG tablet Take 1 tablet (100 mg total) by mouth every 2 (two) hours as needed for migraine. May repeat in 2 hours if headache persists or recurs. 10 tablet 5  . valACYclovir (VALTREX) 1000 MG tablet TAKE 1 TABLET BY MOUTH 3 TIMES A DAY FOR 7 DAYS     No current facility-administered medications for this visit.     Family History  Problem Relation Age of Onset  . Diabetes Mother   . Leukemia Mother   .  Cancer Mother        leukemia  . Heart disease Father   . Retinitis pigmentosa Father   . Migraines Father   . Healthy Sister   . Healthy Sister   . Healthy Child   . Healthy Child   . Leukemia Brother     Review of Systems  Constitutional: Negative.   HENT: Negative.   Eyes: Negative.   Respiratory: Negative.   Cardiovascular: Negative.   Gastrointestinal: Negative.   Endocrine: Negative.   Genitourinary: Negative.   Musculoskeletal: Negative.   Skin: Negative.   Allergic/Immunologic: Negative.   Neurological: Negative.   Hematological: Negative.   Psychiatric/Behavioral: Negative.     Exam:   BP 110/70 (BP Location: Left Arm, Patient Position: Sitting, Cuff Size: Normal)   Pulse 80   Temp (!) 97.3 F (36.3 C) (Temporal)   Resp 12   Ht 5\' 3"  (1.6 m)   Wt 150 lb (68 kg)   BMI 26.57 kg/m     General appearance: alert, cooperative and appears stated  age Head: normocephalic, without obvious abnormality, atraumatic Neck: no adenopathy, supple, symmetrical, trachea midline and thyroid normal to inspection and palpation Lungs: clear to auscultation bilaterally Breasts: normal appearance, no masses or tenderness, No nipple retraction or dimpling, No nipple discharge or bleeding, No axillary adenopathy Heart: regular rate and rhythm Abdomen: soft, non-tender; no masses, no organomegaly Extremities: extremities normal, atraumatic, no cyanosis or edema Skin: skin color, texture, turgor normal. No rashes or lesions Lymph nodes: cervical, supraclavicular, and axillary nodes normal. Neurologic: grossly normal  Pelvic: External genitalia:  no lesions              No abnormal inguinal nodes palpated.              Urethra:  normal appearing urethra with no masses, tenderness or lesions              Bartholins and Skenes: normal                 Vagina: absent              Pap taken: No. Bimanual Exam:  Uterus:  absent              Adnexa: no mass, fullness, tenderness              Rectal exam: Yes.  .  Confirms.              Anus:  normal sphincter tone, no lesions  Chaperone was present for exam.  Assessment:   Well woman visit with normal exam. ERT patient.  Status post TVH with BSO and prolapse repair. Osteopenia.Resolved with last BMD. FH CAD.  Plan: Mammogram screening discussed. Self breast awareness reviewed. Pap and HR HPV as above. Guidelines for Calcium, Vitamin D, regular exercise program including cardiovascular and weight bearing exercise. Refill of Vivelle Dot 0.0375 mg twice weekly.  We discussed increased risk of stroke, DVT, and PE. She will see cardiology. If she comes off the transdermal estrogen, we will consider local vaginal estrogen treatment.  We discussed potential effect of this on breast cancer. Follow up annually and prn.      After visit summary provided.

## 2019-09-18 DIAGNOSIS — E7849 Other hyperlipidemia: Secondary | ICD-10-CM | POA: Diagnosis not present

## 2019-09-23 ENCOUNTER — Other Ambulatory Visit: Payer: Self-pay | Admitting: Adult Health

## 2019-09-25 DIAGNOSIS — J3081 Allergic rhinitis due to animal (cat) (dog) hair and dander: Secondary | ICD-10-CM | POA: Diagnosis not present

## 2019-09-25 DIAGNOSIS — J301 Allergic rhinitis due to pollen: Secondary | ICD-10-CM | POA: Diagnosis not present

## 2019-09-25 DIAGNOSIS — J4520 Mild intermittent asthma, uncomplicated: Secondary | ICD-10-CM | POA: Diagnosis not present

## 2019-09-25 DIAGNOSIS — J3089 Other allergic rhinitis: Secondary | ICD-10-CM | POA: Diagnosis not present

## 2019-09-26 ENCOUNTER — Other Ambulatory Visit: Payer: Self-pay

## 2019-09-26 ENCOUNTER — Other Ambulatory Visit: Payer: PPO | Admitting: *Deleted

## 2019-09-26 DIAGNOSIS — R7989 Other specified abnormal findings of blood chemistry: Secondary | ICD-10-CM | POA: Diagnosis not present

## 2019-09-26 DIAGNOSIS — E785 Hyperlipidemia, unspecified: Secondary | ICD-10-CM

## 2019-09-26 DIAGNOSIS — M8580 Other specified disorders of bone density and structure, unspecified site: Secondary | ICD-10-CM

## 2019-09-26 DIAGNOSIS — Z8249 Family history of ischemic heart disease and other diseases of the circulatory system: Secondary | ICD-10-CM

## 2019-09-27 LAB — COMPREHENSIVE METABOLIC PANEL
ALT: 25 IU/L (ref 0–32)
AST: 25 IU/L (ref 0–40)
Albumin/Globulin Ratio: 1.8 (ref 1.2–2.2)
Albumin: 4.3 g/dL (ref 3.8–4.8)
Alkaline Phosphatase: 64 IU/L (ref 39–117)
BUN/Creatinine Ratio: 18 (ref 12–28)
BUN: 19 mg/dL (ref 8–27)
Bilirubin Total: 0.7 mg/dL (ref 0.0–1.2)
CO2: 23 mmol/L (ref 20–29)
Calcium: 9.7 mg/dL (ref 8.7–10.3)
Chloride: 100 mmol/L (ref 96–106)
Creatinine, Ser: 1.05 mg/dL — ABNORMAL HIGH (ref 0.57–1.00)
GFR calc Af Amer: 62 mL/min/{1.73_m2} (ref >59)
GFR calc non Af Amer: 54 mL/min/{1.73_m2} — ABNORMAL LOW (ref >59)
Globulin, Total: 2.4 g/dL (ref 1.5–4.5)
Glucose: 90 mg/dL (ref 65–99)
Potassium: 4.3 mmol/L (ref 3.5–5.2)
Sodium: 140 mmol/L (ref 134–144)
Total Protein: 6.7 g/dL (ref 6.0–8.5)

## 2019-09-27 LAB — CBC
Hematocrit: 44.3 % (ref 34.0–46.6)
Hemoglobin: 14.6 g/dL (ref 11.1–15.9)
MCH: 29.7 pg (ref 26.6–33.0)
MCHC: 33 g/dL (ref 31.5–35.7)
MCV: 90 fL (ref 79–97)
Platelets: 226 10*3/uL (ref 150–450)
RBC: 4.91 x10E6/uL (ref 3.77–5.28)
RDW: 13.2 % (ref 11.7–15.4)
WBC: 6.3 10*3/uL (ref 3.4–10.8)

## 2019-09-27 LAB — NMR, LIPOPROFILE
Cholesterol, Total: 167 mg/dL (ref 100–199)
HDL Particle Number: 41.4 umol/L (ref >=30.5)
HDL-C: 59 mg/dL (ref >39)
LDL Particle Number: 1264 nmol/L — ABNORMAL HIGH (ref <1000)
LDL Size: 20.5 nm — ABNORMAL LOW (ref >20.5)
LDL-C (NIH Calc): 92 mg/dL (ref 0–99)
LP-IR Score: 27 (ref <=45)
Small LDL Particle Number: 762 nmol/L — ABNORMAL HIGH (ref <=527)
Triglycerides: 88 mg/dL (ref 0–149)

## 2019-09-27 LAB — HEMOGLOBIN A1C
Est. average glucose Bld gHb Est-mCnc: 123 mg/dL
Hgb A1c MFr Bld: 5.9 % — ABNORMAL HIGH (ref 4.8–5.6)

## 2019-09-27 LAB — APOLIPOPROTEIN B: Apolipoprotein B: 82 mg/dL (ref <90)

## 2019-09-27 LAB — LIPOPROTEIN A (LPA): Lipoprotein (a): 13.9 nmol/L (ref <75.0)

## 2019-09-27 LAB — TSH: TSH: 6.81 u[IU]/mL — ABNORMAL HIGH (ref 0.450–4.500)

## 2019-09-27 LAB — VITAMIN D 25 HYDROXY (VIT D DEFICIENCY, FRACTURES): Vit D, 25-Hydroxy: 70.5 ng/mL (ref 30.0–100.0)

## 2019-09-29 DIAGNOSIS — J309 Allergic rhinitis, unspecified: Secondary | ICD-10-CM | POA: Diagnosis not present

## 2019-09-29 DIAGNOSIS — K219 Gastro-esophageal reflux disease without esophagitis: Secondary | ICD-10-CM | POA: Diagnosis not present

## 2019-09-29 DIAGNOSIS — Z0000 Encounter for general adult medical examination without abnormal findings: Secondary | ICD-10-CM | POA: Diagnosis not present

## 2019-09-29 DIAGNOSIS — B029 Zoster without complications: Secondary | ICD-10-CM | POA: Diagnosis not present

## 2019-09-29 DIAGNOSIS — Z1331 Encounter for screening for depression: Secondary | ICD-10-CM | POA: Diagnosis not present

## 2019-09-29 DIAGNOSIS — G43909 Migraine, unspecified, not intractable, without status migrainosus: Secondary | ICD-10-CM | POA: Diagnosis not present

## 2019-09-29 DIAGNOSIS — E785 Hyperlipidemia, unspecified: Secondary | ICD-10-CM | POA: Diagnosis not present

## 2019-09-29 DIAGNOSIS — Z780 Asymptomatic menopausal state: Secondary | ICD-10-CM | POA: Diagnosis not present

## 2019-09-29 DIAGNOSIS — J45909 Unspecified asthma, uncomplicated: Secondary | ICD-10-CM | POA: Diagnosis not present

## 2019-09-29 DIAGNOSIS — G4700 Insomnia, unspecified: Secondary | ICD-10-CM | POA: Diagnosis not present

## 2019-10-01 DIAGNOSIS — Z23 Encounter for immunization: Secondary | ICD-10-CM | POA: Diagnosis not present

## 2019-10-02 NOTE — Progress Notes (Signed)
Cardiology Office Note   Date:  10/03/2019   ID:  Courtney Horne, DOB [birth year], MRN [id number]  PCP:  Crist Infante, MD  Cardiologist:   Dorris Carnes, MD    F/U of HL     History of Present Illness: Courtney Horne is a 68 y.o. female with a history of HL, mild elevation of LFTs  Myoview in 2010 normal  LVEF normal     Followed by Marella Chimes in past  COnt to follow with J Medoff    I saw the pt in 2019   after this visit I set the patient up for a cardiac calcium score.  This showed a score of 18 which was 55th percentile for age and sex.  Calcium was noted in the distal left main and mid LAD.  Plan was to continue on medical therapy as she was asymptomatic.  In the interval patient has done okay.  She follows with Hardie Shackleton and was seen recently.  She just had her lipids done here.  She is playing golf.  She is no longer working.  She denies chest pain no shortness of breath.  No swelling.  Current Meds  Medication Sig  . amitriptyline (ELAVIL) 50 MG tablet TAKE 1.5 TABLETS (75 MG TOTAL) BY MOUTH AT BEDTIME.  Marland Kitchen Biotin 5000 MCG TABS   . budesonide-formoterol (SYMBICORT) 160-4.5 MCG/ACT inhaler Inhale into the lungs.  . Ergocalciferol 2000 units CAPS Take 3,000 Units by mouth daily.  Marland Kitchen estradiol (MINIVELLE) 0.0375 MG/24HR Place 1 patch onto the skin 2 (two) times a week. Apply anywhere on lower abdomen twice weekly.  . fexofenadine (ALLEGRA) 180 MG tablet Take 180 mg by mouth daily.  . Multiple Vitamin (MULTIVITAMIN) tablet Take 1 tablet by mouth daily.  Marland Kitchen omeprazole (PRILOSEC) 10 MG capsule Take 10 mg by mouth 2 (two) times daily.   . Probiotic Product (PROBIOTIC DAILY PO) Take 1 tablet by mouth daily.  . rosuvastatin (CRESTOR) 20 MG tablet Take 1 tablet (20 mg total) by mouth daily.  . SUMAtriptan (IMITREX) 100 MG tablet Take 1 tablet (100 mg total) by mouth every 2 (two) hours as needed for migraine. May repeat in 2 hours if headache persists or recurs.      Allergies:   Codeine, Penicillins, and Zocor [simvastatin]   Past Medical History:  Diagnosis Date  . Adenomyosis   . Asthma   . Asthma   . Cystocele   . Fibroid   . High cholesterol   . Menopausal symptoms   . Migraine   . Migraine   . Osteopenia 2017   Resolved.  BMD normal in 2017.  Marland Kitchen Rectocele   . Urinary incontinence   . Uterine prolapse     Past Surgical History:  Procedure Laterality Date  . APPENDECTOMY    . BACK SURGERY  1999   L4-L5  . BLADDER SUSPENSION    . CATARACT EXTRACTION, BILATERAL  07/2018  . COLONOSCOPY  10/2011  . NASAL SINUS SURGERY    . TONSILLECTOMY    . VAGINAL HYSTERECTOMY  2011   Vag. Hyst BSO A/P repair-Sling     Social History:  The patient  reports that she quit smoking about 47 years ago. She has never used smokeless tobacco. She reports current alcohol use of about 2.0 standard drinks of alcohol per week. She reports that she does not use drugs.   Family History:  The patient's family history includes Cancer in her mother; Diabetes in her mother; Healthy  in her child, child, sister, and sister; Heart disease in her father; Leukemia in her brother and mother; Migraines in her father; Retinitis pigmentosa in her father.    ROS:  Please see the history of present illness. All other systems are reviewed and  Negative to the above problem except as noted.    PHYSICAL EXAM: VS:  BP 116/76   Pulse 71   Ht 5\' 3"  (1.6 m)   Wt 150 lb (68 kg)   BMI 26.57 kg/m   GEN: Well nourished, well developed, in no acute distress  HEENT: normal  Neck: JVP not elevated NO, carotid bruits, or masses Cardiac: RRR; no murmurs, rubs, or gallops,no edema  Respiratory:  clear to auscultation bilaterally, normal work of breathing GI: soft, nontender, nondistended, + BS  No hepatomegaly  MS: no deformity Moving all extremities   Skin: warm and dry, no rash Neuro:  Strength and sensation are intact Psych: euthymic mood, full affect   EKG:  EKG is ordered  today.  SR 71 bpm    Lipid Panel    Component Value Date/Time   CHOL 160 05/11/2017 0742   CHOL 184 01/23/2017 0925   TRIG 65 05/11/2017 0742   HDL 73 05/11/2017 0742   CHOLHDL 3.1 01/23/2017 0925   CHOLHDL 2.7 01/10/2016 0934   VLDL 19 01/10/2016 0934   LDLCALC 104 (H) 01/23/2017 0925      Wt Readings from Last 3 Encounters:  10/03/19 150 lb (68 kg)  09/08/19 150 lb (68 kg)  10/09/18 152 lb 12.8 oz (69.3 kg)      ASSESSMENT AND PLAN:  1  HL the patient had a lipoma panel done this showed her LDL was 92.  Her particle number was over 1200.  With the plaquing that she has I would recommend more aggressive control.  Will therefore recommending increasing her statin and following up lipids.  Discussed diet.  Also discussed increasing her aerobic activity.  2  Thyroid will repeat free T3 and free T4 since TSH was minimally elevated  3.  CAD.  Patient has mild plaquing on CT scan done a year ago.  She denies angina.  Risk factor modification planned  3  Hx elevated LFTs LFTs are good.  Stay active  F/U 1 yr in clinic we will check labs in the interval.   Current medicines are reviewed at length with the patient today.  The patient does not have concerns regarding medicines.  Signed, Dorris Carnes, MD  10/03/2019 8:49 AM    [address], [place], [place]  [address] Phone: [phone]; Fax: [phone]

## 2019-10-03 ENCOUNTER — Encounter: Payer: Self-pay | Admitting: Internal Medicine

## 2019-10-03 ENCOUNTER — Ambulatory Visit: Payer: PPO | Admitting: Internal Medicine

## 2019-10-03 ENCOUNTER — Other Ambulatory Visit: Payer: Self-pay

## 2019-10-03 VITALS — BP 116/76 | HR 71 | Ht 63.0 in | Wt 150.0 lb

## 2019-10-03 DIAGNOSIS — E785 Hyperlipidemia, unspecified: Secondary | ICD-10-CM | POA: Diagnosis not present

## 2019-10-03 DIAGNOSIS — R7989 Other specified abnormal findings of blood chemistry: Secondary | ICD-10-CM | POA: Diagnosis not present

## 2019-10-03 LAB — T4, FREE: Free T4: 0.95 ng/dL (ref 0.82–1.77)

## 2019-10-03 LAB — T3, FREE: T3, Free: 2.7 pg/mL (ref 2.0–4.4)

## 2019-10-03 MED ORDER — ROSUVASTATIN CALCIUM 40 MG PO TABS: 40.0000 mg | ORAL_TABLET | Freq: Every day | ORAL | 3 refills | Status: DC

## 2019-10-03 NOTE — Patient Instructions (Addendum)
Medication Instructions:  Your physician has recommended you make the following change in your medication:  1.) increase rosuvastatin (Crestor) to 40 mg once a day  *If you need a refill on your cardiac medications before your next appointment, please call your pharmacy*  Lab Work: Today: Free t3, Free t4 In 3 months Lipomed panel, liver function--Dec 31, 2019  If you have labs (blood work) drawn today and your tests are completely normal, you will receive your results only by: Marland Kitchen MyChart Message (if you have MyChart) OR . A paper copy in the mail If you have any lab test that is abnormal or we need to change your treatment, we will call you to review the results.  Testing/Procedures: none  Follow-Up: At [facility], you and your health needs are our priority.  As part of our continuing mission to provide you with exceptional heart care, we have created designated Provider Care Teams.  These Care Teams include your primary Cardiologist (physician) and Advanced Practice Providers (APPs -  Physician Assistants and Nurse Practitioners) who all work together to provide you with the care you need, when you need it.  Your next appointment:   12 months  The format for your next appointment:   In Person  Provider:   Dorris Carnes, MD  Other Instructions

## 2019-10-28 ENCOUNTER — Encounter: Payer: Self-pay | Admitting: Adult Health

## 2019-10-28 ENCOUNTER — Ambulatory Visit: Payer: PPO | Admitting: Adult Health

## 2019-10-28 ENCOUNTER — Other Ambulatory Visit: Payer: Self-pay

## 2019-10-28 VITALS — BP 125/68 | HR 59 | Temp 97.5°F | Ht 63.0 in | Wt 151.6 lb

## 2019-10-28 DIAGNOSIS — G43001 Migraine without aura, not intractable, with status migrainosus: Secondary | ICD-10-CM | POA: Diagnosis not present

## 2019-10-28 DIAGNOSIS — G4700 Insomnia, unspecified: Secondary | ICD-10-CM

## 2019-10-28 MED ORDER — AMITRIPTYLINE HCL 50 MG PO TABS: 100.0000 mg | ORAL_TABLET | Freq: Every day | ORAL | 3 refills | Status: DC

## 2019-10-28 NOTE — Patient Instructions (Signed)
Your Plan:  Increase Amitriptyline to 100 mg at bedtime If your symptoms worsen or you develop new symptoms please let us know.    Thank you for coming to see [place] at [facility]. I hope we have been able to provide you high quality care today.  You may receive a patient satisfaction survey over the next few weeks. We would appreciate your feedback and comments so that we may continue to improve ourselves and the health of our patients.  Amitriptyline tablets What is this medicine? AMITRIPTYLINE (a mee TRIP ti leen) is used to treat depression. This medicine may be used for other purposes; ask your health care provider or pharmacist if you have questions. COMMON BRAND NAME(S): Elavil, Vanatrip What should I tell my health care provider before I take this medicine? They need to know if you have any of these conditions:  an alcohol problem  asthma, difficulty breathing  bipolar disorder or schizophrenia  difficulty passing urine, prostate trouble  glaucoma  heart disease or previous heart attack  liver disease  over active thyroid  seizures  thoughts or plans of suicide, a previous suicide attempt, or family history of suicide attempt  an unusual or allergic reaction to amitriptyline, other medicines, foods, dyes, or preservatives  pregnant or trying to get pregnant  breast-feeding How should I use this medicine? Take this medicine by mouth with a drink of water. Follow the directions on the prescription label. You can take the tablets with or without food. Take your medicine at regular intervals. Do not take it more often than directed. Do not stop taking this medicine suddenly except upon the advice of your doctor. Stopping this medicine too quickly may cause serious side effects or your condition may worsen. A special MedGuide will be given to you by the pharmacist with each prescription and refill. Be sure to read this information carefully each  time. Talk to your pediatrician regarding the use of this medicine in children. Special care may be needed. Overdosage: If you think you have taken too much of this medicine contact a poison control center or emergency room at once. NOTE: This medicine is only for you. Do not share this medicine with others. What if I miss a dose? If you miss a dose, take it as soon as you can. If it is almost time for your next dose, take only that dose. Do not take double or extra doses. What may interact with this medicine? Do not take this medicine with any of the following medications:  arsenic trioxide  certain medicines used to regulate abnormal heartbeat or to treat other heart conditions  cisapride  droperidol  halofantrine  linezolid  MAOIs like Carbex, Eldepryl, Marplan, Nardil, and Parnate  methylene blue  other medicines for mental depression  phenothiazines like perphenazine, thioridazine and chlorpromazine  pimozide  probucol  procarbazine  sparfloxacin  St. John's Wort This medicine may also interact with the following medications:  atropine and related drugs like hyoscyamine, scopolamine, tolterodine and others  barbiturate medicines for inducing sleep or treating seizures, like phenobarbital  cimetidine  disulfiram  ethchlorvynol  thyroid hormones such as levothyroxine  ziprasidone This list may not describe all possible interactions. Give your health care provider a list of all the medicines, herbs, non-prescription drugs, or dietary supplements you use. Also tell them if you smoke, drink alcohol, or use illegal drugs. Some items may interact with your medicine. What should I watch for while using this medicine? Tell your doctor  if your symptoms do not get better or if they get worse. Visit your doctor or health care professional for regular checks on your progress. Because it may take several weeks to see the full effects of this medicine, it is important to  continue your treatment as prescribed by your doctor. Patients and their families should watch out for new or worsening thoughts of suicide or depression. Also watch out for sudden changes in feelings such as feeling anxious, agitated, panicky, irritable, hostile, aggressive, impulsive, severely restless, overly excited and hyperactive, or not being able to sleep. If this happens, especially at the beginning of treatment or after a change in dose, call your health care professional. Dennis Bast may get drowsy or dizzy. Do not drive, use machinery, or do anything that needs mental alertness until you know how this medicine affects you. Do not stand or sit up quickly, especially if you are an older patient. This reduces the risk of dizzy or fainting spells. Alcohol may interfere with the effect of this medicine. Avoid alcoholic drinks. Do not treat yourself for coughs, colds, or allergies without asking your doctor or health care professional for advice. Some ingredients can increase possible side effects. Your mouth may get dry. Chewing sugarless gum or sucking hard candy, and drinking plenty of water will help. Contact your doctor if the problem does not go away or is severe. This medicine may cause dry eyes and blurred vision. If you wear contact lenses you may feel some discomfort. Lubricating drops may help. See your eye doctor if the problem does not go away or is severe. This medicine can cause constipation. Try to have a bowel movement at least every 2 to 3 days. If you do not have a bowel movement for 3 days, call your doctor or health care professional. This medicine can make you more sensitive to the sun. Keep out of the sun. If you cannot avoid being in the sun, wear protective clothing and use sunscreen. Do not use sun lamps or tanning beds/booths. What side effects may I notice from receiving this medicine? Side effects that you should report to your doctor or health care professional as soon as  possible:  allergic reactions like skin rash, itching or hives, swelling of the face, lips, or tongue  anxious  breathing problems  changes in vision  confusion  elevated mood, decreased need for sleep, racing thoughts, impulsive behavior  eye pain  fast, irregular heartbeat  feeling faint or lightheaded, falls  feeling agitated, angry, or irritable  fever with increased sweating  hallucination, loss of contact with reality  seizures  stiff muscles  suicidal thoughts or other mood changes  tingling, pain, or numbness in the feet or hands  trouble passing urine or change in the amount of urine  trouble sleeping  unusually weak or tired  vomiting  yellowing of the eyes or skin Side effects that usually do not require medical attention (report to your doctor or health care professional if they continue or are bothersome):  change in sex drive or performance  change in appetite or weight  constipation  dizziness  dry mouth  nausea  tired  tremors  upset stomach This list may not describe all possible side effects. Call your doctor for medical advice about side effects. You may report side effects to FDA at 1-800-FDA-1088. Where should I keep my medicine? Keep out of the reach of children. Store at room temperature between 20 and 25 degrees C (68 and 77 degrees  F). Throw away any unused medicine after the expiration date. NOTE: This sheet is a summary. It may not cover all possible information. If you have questions about this medicine, talk to your doctor, pharmacist, or health care provider.  2020 Elsevier/Gold Standard (2018-10-29 13:04:32)

## 2019-10-28 NOTE — Progress Notes (Signed)
PATIENT: Courtney Horne DOB: Sep 21, 1951  REASON FOR VISIT: follow up HISTORY FROM: patient  HISTORY OF PRESENT ILLNESS: Today 10/28/19:  Courtney Horne is a 68 year old female with a history of migraine headaches.  She returns today for follow-up.  She states that her headaches have been under relatively good control.  She may have approximately 3 headaches a month.  She states that her headaches are typically triggered by alcohol or weather.  She states that Imitrex continues to work well for her.  She states that she golfs 3 times a week.  If it is too cold to golf she will go walking.  She also does puzzles.  She states that she has been taking amitriptyline 75 mg for approximately 10 years.  She states that it is worked well for her migraines and her sleep.  She has noticed recently she has been waking up at least once at night and has difficulty going back to sleep.  She is questioning whether amitriptyline could be increased to 100 mg.  She denies any issues with constipation, urinary retention, blurred vision or dry mouth.  I reviewed recent EKG completed November 2020.   HISTORY 10/09/18:  Courtney Horne is a 68 year old female with a history of migraine.  She returns today for follow-up.  She reports that her headaches are relatively good control.  She has approximately 1 headache a week.  Her headaches tend to occur on the left side of the face.  She denies photophobia or phonophobia.  She states that she takes Imitrex her headache typically resolves in 15 to 30 minutes.  She notes to that with amitriptyline she was sleeping well but now she is waking up 2-3 times at night.  She returns today for evaluation.  REVIEW OF SYSTEMS: Out of a complete 14 system review of symptoms, the patient complains only of the following symptoms, and all other reviewed systems are negative.  See HPI  ALLERGIES: Allergies  Allergen Reactions  . Codeine Nausea And Vomiting  . Penicillins Itching and  Rash  . Zocor [Simvastatin] Other (See Comments)    HOME MEDICATIONS: Outpatient Medications Prior to Visit  Medication Sig Dispense Refill  . amitriptyline (ELAVIL) 50 MG tablet TAKE 1.5 TABLETS (75 MG TOTAL) BY MOUTH AT BEDTIME. 135 tablet 1  . aspirin (ADULT ASPIRIN REGIMEN) 81 MG EC tablet Take 81 mg by mouth 2 (two) times a week.    . Biotin 5000 MCG TABS     . budesonide-formoterol (SYMBICORT) 160-4.5 MCG/ACT inhaler Inhale into the lungs.    . Ergocalciferol 2000 units CAPS Take 3,000 Units by mouth daily. (Patient taking differently: Take 4,000 Units by mouth daily. ) 30 capsule   . estradiol (MINIVELLE) 0.0375 MG/24HR Place 1 patch onto the skin 2 (two) times a week. Apply anywhere on lower abdomen twice weekly. 24 patch 3  . fexofenadine (ALLEGRA) 180 MG tablet Take 180 mg by mouth daily.    . Multiple Vitamin (MULTIVITAMIN) tablet Take 1 tablet by mouth daily.    Marland Kitchen omeprazole (PRILOSEC) 10 MG capsule Take 10 mg by mouth 2 (two) times daily.    . Probiotic Product (PROBIOTIC DAILY PO) Take 1 tablet by mouth daily.    . rosuvastatin (CRESTOR) 40 MG tablet Take 1 tablet (40 mg total) by mouth daily. 90 tablet 3  . SUMAtriptan (IMITREX) 100 MG tablet Take 1 tablet (100 mg total) by mouth every 2 (two) hours as needed for migraine. May repeat in 2 hours if  headache persists or recurs. 10 tablet 5   No facility-administered medications prior to visit.     PAST MEDICAL HISTORY: Past Medical History:  Diagnosis Date  . Adenomyosis   . Asthma   . Asthma   . Cystocele   . Fibroid   . High cholesterol   . Menopausal symptoms   . Migraine   . Migraine   . Osteopenia 2017   Resolved.  BMD normal in 2017.  Marland Kitchen Rectocele   . Urinary incontinence   . Uterine prolapse     PAST SURGICAL HISTORY: Past Surgical History:  Procedure Laterality Date  . APPENDECTOMY    . BACK SURGERY  1999   L4-L5  . BLADDER SUSPENSION    . CATARACT EXTRACTION, BILATERAL  07/2018  . COLONOSCOPY   10/2011  . NASAL SINUS SURGERY    . TONSILLECTOMY    . VAGINAL HYSTERECTOMY  2011   Vag. Hyst BSO A/P repair-Sling    FAMILY HISTORY: Family History  Problem Relation Age of Onset  . Diabetes Mother   . Leukemia Mother   . Cancer Mother        leukemia  . Heart disease Father   . Retinitis pigmentosa Father   . Migraines Father   . Healthy Sister   . Healthy Sister   . Healthy Child   . Healthy Child   . Leukemia Brother     SOCIAL HISTORY: Social History   Socioeconomic History  . Marital status: Married    Spouse name: [name]  . Number of children: 2  . Years of education: 26  . Highest education level: Not on file  Occupational History    Employer: [employer]    Comment: [employer]  Social Needs  . Financial resource strain: Not on file  . Food insecurity    Worry: Not on file    Inability: Not on file  . Transportation needs    Medical: Not on file    Non-medical: Not on file  Tobacco Use  . Smoking status: Former Smoker    Quit date: 05/08/1972    Years since quitting: 47.5  . Smokeless tobacco: Never Used  . Tobacco comment: in college  Substance and Sexual Activity  . Alcohol use: Yes    Alcohol/week: 2.0 standard drinks    Types: 2 Glasses of wine per week  . Drug use: No  . Sexual activity: Yes    Partners: Male    Birth control/protection: Surgical    Comment: hysterectomy  Lifestyle  . Physical activity    Days per week: Not on file    Minutes per session: Not on file  . Stress: Not on file  Relationships  . Social Herbalist on phone: Not on file    Gets together: Not on file    Attends religious service: Not on file    Active member of club or organization: Not on file    Attends meetings of clubs or organizations: Not on file    Relationship status: Not on file  . Intimate partner violence    Fear of current or ex partner: Not on file    Emotionally abused: Not on file    Physically abused: Not on file    Forced sexual  activity: Not on file  Other Topics Concern  . Not on file  Social History Narrative   Right handed.  Caffeine 2 cups daily, Married, 2 kids, 5 grand kids.  PHYSICAL EXAM  Vitals:   10/28/19 0843  BP: 125/68  Pulse: (!) 59  Temp: (!) 97.5 F (36.4 C)  TempSrc: Oral  Weight: 151 lb 9.6 oz (68.8 kg)  Height: 5\' 3"  (1.6 m)   Body mass index is 26.85 kg/m.  Generalized: Well developed, in no acute distress   Neurological examination  Mentation: Alert oriented to time, place, history taking. Follows all commands speech and language fluent Cranial nerve II-XII: Pupils were equal round reactive to light. Extraocular movements were full, visual field were full on confrontational test. . Head turning and shoulder shrug  were normal and symmetric. Motor: The motor testing reveals 5 over 5 strength of all 4 extremities. Good symmetric motor tone is noted throughout.  Sensory: Sensory testing is intact to soft touch on all 4 extremities. No evidence of extinction is noted.  Coordination: Cerebellar testing reveals good finger-nose-finger and heel-to-shin bilaterally.  Gait and station: Gait is normal.  Reflexes: Deep tendon reflexes are symmetric and normal bilaterally.   DIAGNOSTIC DATA (LABS, IMAGING, TESTING) - I reviewed patient records, labs, notes, testing and imaging myself where available.  Lab Results  Component Value Date   WBC 6.3 09/26/2019   HGB 14.6 09/26/2019   HCT 44.3 09/26/2019   MCV 90 09/26/2019   PLT 226 09/26/2019      Component Value Date/Time   NA 140 09/26/2019 0906   K 4.3 09/26/2019 0906   CL 100 09/26/2019 0906   CO2 23 09/26/2019 0906   GLUCOSE 90 09/26/2019 0906   GLUCOSE 88 01/10/2016 0934   BUN 19 09/26/2019 0906   CREATININE 1.05 (H) 09/26/2019 0906   CREATININE 0.92 01/10/2016 0934   CALCIUM 9.7 09/26/2019 0906   PROT 6.7 09/26/2019 0906   ALBUMIN 4.3 09/26/2019 0906   AST 25 09/26/2019 0906   ALT 25 09/26/2019 0906   ALKPHOS 64  09/26/2019 0906   BILITOT 0.7 09/26/2019 0906   GFRNONAA 54 (L) 09/26/2019 0906   GFRAA 62 09/26/2019 0906   Lab Results  Component Value Date   CHOL 160 05/11/2017   HDL 73 05/11/2017   LDLCALC 104 (H) 01/23/2017   TRIG 65 05/11/2017   CHOLHDL 3.1 01/23/2017   Lab Results  Component Value Date   HGBA1C 5.9 (H) 09/26/2019   No results found for: VITAMINB12 Lab Results  Component Value Date   TSH 6.810 (H) 09/26/2019      ASSESSMENT AND PLAN 68 y.o. year old female  has a past medical history of Adenomyosis, Asthma, Asthma, Cystocele, Fibroid, High cholesterol, Menopausal symptoms, Migraine, Migraine, Osteopenia (2017), Rectocele, Urinary incontinence, and Uterine prolapse. here with :  1.  Migraine headaches 2.  Insomnia  Overall the patient is doing well.  She has tolerated amitriptyline well.  I will increase her dose to 100 mg to see if this helps with insomnia.  I have reviewed potential side effects with the patient and provided her with a handout.  Advised that if she does not find this beneficial she should let us know.  She will follow-up in 6 months or sooner if needed.    Ward Givens, MSN, NP-C 10/28/2019, 8:59 AM [facility] [address] [place], [place] [address] [phone]

## 2019-11-05 DIAGNOSIS — M5416 Radiculopathy, lumbar region: Secondary | ICD-10-CM | POA: Diagnosis not present

## 2019-11-05 DIAGNOSIS — M47816 Spondylosis without myelopathy or radiculopathy, lumbar region: Secondary | ICD-10-CM | POA: Diagnosis not present

## 2019-11-05 DIAGNOSIS — M545 Low back pain: Secondary | ICD-10-CM | POA: Diagnosis not present

## 2019-11-05 DIAGNOSIS — M4316 Spondylolisthesis, lumbar region: Secondary | ICD-10-CM | POA: Diagnosis not present

## 2019-11-12 DIAGNOSIS — M545 Low back pain: Secondary | ICD-10-CM | POA: Diagnosis not present

## 2019-11-25 DIAGNOSIS — M4807 Spinal stenosis, lumbosacral region: Secondary | ICD-10-CM | POA: Diagnosis not present

## 2019-11-25 DIAGNOSIS — M5126 Other intervertebral disc displacement, lumbar region: Secondary | ICD-10-CM | POA: Diagnosis not present

## 2019-11-25 DIAGNOSIS — M4316 Spondylolisthesis, lumbar region: Secondary | ICD-10-CM | POA: Diagnosis not present

## 2019-12-01 DIAGNOSIS — M545 Low back pain: Secondary | ICD-10-CM | POA: Diagnosis not present

## 2019-12-01 DIAGNOSIS — M5416 Radiculopathy, lumbar region: Secondary | ICD-10-CM | POA: Diagnosis not present

## 2019-12-01 DIAGNOSIS — M47816 Spondylosis without myelopathy or radiculopathy, lumbar region: Secondary | ICD-10-CM | POA: Diagnosis not present

## 2019-12-01 DIAGNOSIS — M4316 Spondylolisthesis, lumbar region: Secondary | ICD-10-CM | POA: Diagnosis not present

## 2019-12-03 ENCOUNTER — Ambulatory Visit: Payer: Medicare Other | Attending: Internal Medicine

## 2019-12-03 DIAGNOSIS — Z23 Encounter for immunization: Secondary | ICD-10-CM | POA: Insufficient documentation

## 2019-12-03 NOTE — Progress Notes (Signed)
   Covid-19 Vaccination Clinic  Name:  Courtney Horne    MRN: [id number] DOB: August 28, 1951  12/03/2019  Courtney Horne was observed post Covid-19 immunization for 15 minutes without incidence. She was provided with Vaccine Information Sheet and instruction to access the V-Safe system.   Courtney Horne was instructed to call 911 with any severe reactions post vaccine: Marland Kitchen Difficulty breathing  . Swelling of your face and throat  . A fast heartbeat  . A bad rash all over your body  . Dizziness and weakness    Immunizations Administered    Name Date Dose VIS Date Route   Pfizer COVID-19 Vaccine 12/03/2019 10:11 AM 0.3 mL 10/31/2019 Intramuscular   Manufacturer: [employer], [employer]   Lot: [id number]   [place]: [id number]

## 2019-12-04 DIAGNOSIS — H04123 Dry eye syndrome of bilateral lacrimal glands: Secondary | ICD-10-CM | POA: Diagnosis not present

## 2019-12-04 DIAGNOSIS — H5211 Myopia, right eye: Secondary | ICD-10-CM | POA: Diagnosis not present

## 2019-12-04 DIAGNOSIS — Z961 Presence of intraocular lens: Secondary | ICD-10-CM | POA: Diagnosis not present

## 2019-12-04 DIAGNOSIS — H52203 Unspecified astigmatism, bilateral: Secondary | ICD-10-CM | POA: Diagnosis not present

## 2019-12-04 DIAGNOSIS — H524 Presbyopia: Secondary | ICD-10-CM | POA: Diagnosis not present

## 2019-12-10 DIAGNOSIS — Z1231 Encounter for screening mammogram for malignant neoplasm of breast: Secondary | ICD-10-CM | POA: Diagnosis not present

## 2019-12-17 DIAGNOSIS — M5416 Radiculopathy, lumbar region: Secondary | ICD-10-CM | POA: Diagnosis not present

## 2019-12-17 DIAGNOSIS — M4316 Spondylolisthesis, lumbar region: Secondary | ICD-10-CM | POA: Diagnosis not present

## 2019-12-17 DIAGNOSIS — M48061 Spinal stenosis, lumbar region without neurogenic claudication: Secondary | ICD-10-CM | POA: Diagnosis not present

## 2019-12-17 DIAGNOSIS — M545 Low back pain: Secondary | ICD-10-CM | POA: Diagnosis not present

## 2019-12-18 ENCOUNTER — Other Ambulatory Visit: Payer: Self-pay | Admitting: Neurosurgery

## 2019-12-23 ENCOUNTER — Ambulatory Visit: Payer: PPO | Attending: Internal Medicine

## 2019-12-23 DIAGNOSIS — Z23 Encounter for immunization: Secondary | ICD-10-CM | POA: Insufficient documentation

## 2019-12-23 NOTE — Progress Notes (Addendum)
   Covid-19 Vaccination Clinic  Name:  Courtney Horne    MRN: [id number] DOB: May 08, 1951  12/23/2019  Courtney Horne was observed post Covid-19 immunization for 15 minutes without incidence. She was provided with Vaccine Information Sheet and instruction to access the V-Safe system.   Courtney Horne was instructed to call 911 with any severe reactions post vaccine: Marland Kitchen Difficulty breathing  . Swelling of your face and throat  . A fast heartbeat  . A bad rash all over your body  . Dizziness and weakness    Immunizations Administered    Name Date Dose VIS Date Route   Pfizer COVID-19 Vaccine 12/23/2019  8:35 AM 0.3 mL 10/31/2019 Intramuscular   Manufacturer: [place]   Lot: [id number]   [place]: [id number]

## 2019-12-31 ENCOUNTER — Other Ambulatory Visit: Payer: PPO | Admitting: *Deleted

## 2019-12-31 ENCOUNTER — Other Ambulatory Visit: Payer: Self-pay

## 2019-12-31 DIAGNOSIS — R7989 Other specified abnormal findings of blood chemistry: Secondary | ICD-10-CM

## 2019-12-31 DIAGNOSIS — E785 Hyperlipidemia, unspecified: Secondary | ICD-10-CM

## 2020-01-01 LAB — LIPOPROTEIN A (LPA): Lipoprotein (a): 15.2 nmol/L (ref <75.0)

## 2020-01-01 LAB — NMR, LIPOPROFILE
Cholesterol, Total: 147 mg/dL (ref 100–199)
HDL Particle Number: 39.6 umol/L (ref >=30.5)
HDL-C: 52 mg/dL (ref >39)
LDL Particle Number: 882 nmol/L (ref <1000)
LDL Size: 20.5 nm — ABNORMAL LOW (ref >20.5)
LDL-C (NIH Calc): 73 mg/dL (ref 0–99)
LP-IR Score: 32 (ref <=45)
Small LDL Particle Number: 534 nmol/L — ABNORMAL HIGH (ref <=527)
Triglycerides: 125 mg/dL (ref 0–149)

## 2020-01-01 LAB — HEPATIC FUNCTION PANEL
ALT: 31 IU/L (ref 0–32)
AST: 29 IU/L (ref 0–40)
Albumin: 4.4 g/dL (ref 3.8–4.8)
Alkaline Phosphatase: 68 IU/L (ref 39–117)
Bilirubin Total: 0.7 mg/dL (ref 0.0–1.2)
Bilirubin, Direct: 0.17 mg/dL (ref 0.00–0.40)
Total Protein: 7 g/dL (ref 6.0–8.5)

## 2020-01-01 LAB — APOLIPOPROTEIN B: Apolipoprotein B: 69 mg/dL (ref <90)

## 2020-01-02 ENCOUNTER — Inpatient Hospital Stay (HOSPITAL_COMMUNITY): Admission: RE | Admit: 2020-01-02 | Payer: PPO | Source: Ambulatory Visit

## 2020-01-02 ENCOUNTER — Other Ambulatory Visit (HOSPITAL_COMMUNITY): Payer: PPO

## 2020-01-06 ENCOUNTER — Encounter (HOSPITAL_COMMUNITY): Admission: RE | Payer: Self-pay | Source: Home / Self Care

## 2020-01-06 ENCOUNTER — Inpatient Hospital Stay (HOSPITAL_COMMUNITY): Admission: RE | Admit: 2020-01-06 | Payer: PPO | Source: Home / Self Care | Admitting: Neurosurgery

## 2020-01-06 SURGERY — ANTERIOR LATERAL LUMBAR FUSION 2 LEVELS
Anesthesia: General

## 2020-01-09 ENCOUNTER — Other Ambulatory Visit: Payer: Self-pay | Admitting: Neurosurgery

## 2020-01-14 NOTE — H&P (Signed)
Patient ID:   [id number] Patient: Courtney Horne  Date of Birth: 01-11-1951 Visit Type: Office Visit   Date: 12/17/2019 12:15 PM Provider: Marchia Meiers. Vertell Limber MD   This 69 year old female presents for Increased back pain.  HISTORY OF PRESENT ILLNESS:  1.  Increased back pain  Patient returns reporting increased back pain, left hip and left leg pain.  She is using a friend's lumbar support orthotic 24/7, except showers.  ESI was canceled so she could proceed with the COVID vaccine.  She is due for her 2nd COVID vaccine February 2nd.  Tramadol stopped, offered no relief and patient experienced itching after her 3rd tablet Advil 3 tabs b.i.d. Offers some relief  The patient is having severe left leg pain.  She describes it as up to 12+ out of 10.  She says she is much worse than she was when she was last here and is no longer interested in going ahead with injections as her pain is unrelenting and can be extremely severe.  She says she does not get relief when she is active but she does get some improvement with sitting.  She also says that a back brace has helped to relieve her pain.  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  The patient is complaining of left leg pain and has a markedly positive seated straight leg raise on the left.         Medical/Surgical/Interim History Reviewed, no change.  Last detailed document date:11/05/2019.     PAST MEDICAL HISTORY, SURGICAL HISTORY, FAMILY HISTORY, SOCIAL HISTORY AND REVIEW OF SYSTEMS I have reviewed the patient's past medical, surgical, family and social history as well as the comprehensive review of systems as included on the [facility] history form dated 10/22/2019, which I have signed.  Family History:  Reviewed, no changes.  Last detailed document date:11/05/2019.   Social  History: Reviewed, no changes. Last detailed document date: 11/05/2019.    MEDICATIONS: (added, continued or stopped this visit) Started Medication Directions Instruction Stopped   amitriptyline 75 mg tablet      biotin      Crestor 40 mg tablet      fexofenadine 180 mg tablet      Imitrex 100 mg tablet      multivitamin tablet      Prilosec OTC      symbicort (budesonide + formoterol) *Use as directe    11/28/2019 tramadol 50 mg tablet take 1 tablet by oral route  every 6 hours as needed     vitamin d        ALLERGIES: Ingredient Reaction Medication Name Comment  PENICILLIN     CODEINE     TRAMADOL      Reviewed, no changes.    PHYSICAL EXAM:   Vitals Date Temp F BP Pulse Ht In Wt Lb BMI BSA Pain Score  12/17/2019 96.6 132/75 80 63 153.2 27.14  5/10      IMPRESSION:   I have recommended proceeding with decompression and fusion surgery at the L3-4 and L4-5 levels.  This will be done with XL IF with percutaneous pedicle screw fixation.  The patient has auto fusion at L5-S1 level  PLAN:  Proceed with decompression and fusion surgery on expedited basis due to the severity of the patient's pain complaints.  Orders: Diagnostic Procedures: Assessment Procedure  M54.16  Lumbar Spine- AP/Lat  Instruction(s)/Education: Assessment Instruction  R03.0 Lifestyle education  Z68.27 Dietary management education, guidance, and counseling  Miscellaneous: Assessment   M48.061 LSO Brace   Completed Orders (this encounter) Order Details Reason Side Interpretation Result Initial Treatment Date Region  Lifestyle education Patient will monitor and contact primary care physician if needed.        Dietary management education, guidance, and counseling Encouraged patient to eat well balanced diet.         Assessment/Plan   # Detail Type Description   1. Assessment Low back pain, unspecified back pain laterality, with sciatica presence unspecified (M54.5).       2. Assessment  Degenerative lumbar spinal stenosis (M48.061).   Plan Orders LSO Brace.       3. Assessment Radiculopathy, lumbar region (M54.16).       4. Assessment Spondylolisthesis, lumbar region (M43.16).       5. Assessment Elevated blood-pressure reading, w/o diagnosis of htn (R03.0).       6. Assessment Body mass index (BMI) 27.0-27.9, adult [id number]).   Plan Orders Today's instructions / counseling include(s) Dietary management education, guidance, and counseling. Clinical information/comments: Encouraged patient to eat well balanced diet.         Pain Management Plan Pain Scale: 5/10. Method: Numeric Pain Intensity Scale. Location: back. Onset: 11/05/2019. Duration: varies. Quality: discomforting. Pain management follow-up plan of care: Patient will continue medication management..              Provider:  Marchia Meiers. Vertell Limber MD  12/18/2019 02:49 PM    Dictation edited by: Marchia Meiers. Vertell Limber    CC Providers: Macario Golds  [address] [place], [place] [address]               Electronically signed by Marchia Meiers. Vertell Limber MD on 12/18/2019 02:49 PM

## 2020-01-22 NOTE — Pre-Procedure Instructions (Signed)
[id number] - [place], [place] [address]. AT [address] [place] [address] Phone: [phone] Fax: [phone]     Your procedure is scheduled on Tuesday, March 9th.   Report to [facility] Main Entrance "A" at 09:45 A.M., and check in at the Admitting office.   Call this number if you have problems the morning of surgery:  [phone]  Call [phone] if you have any questions prior to your surgery date Monday-Friday 8am-4pm    Remember:  Do not eat or drink after midnight the night before your surgery.    Take these medicines the morning of surgery with A SIP OF WATER : fexofenadine (ALLEGRA) omeprazole (PRILOSEC)  rosuvastatin (CRESTOR) budesonide-formoterol (SYMBICORT) inhaler Carboxymethylcell-Hypromellose (GENTEAL) EYE GEL  IF NEEDED: ondansetron (ZOFRAN) SUMAtriptan (IMITREX)  As of today, STOP taking any Aspirin (unless otherwise instructed by your surgeon), Aleve, Naproxen, Ibuprofen, Motrin, Advil, Goody's, BC's, all herbal medications, fish oil, and all vitamins.    The Morning of Surgery  Do not wear jewelry, make-up or nail polish.  Do not wear lotions, powders, perfumes, or deodorant.  Do not shave 48 hours prior to surgery.    Do not bring valuables to the hospital.  [facility] is not responsible for any belongings or valuables.  If you are a smoker, DO NOT Smoke 24 hours prior to surgery.  If you wear a CPAP at night please bring your mask the morning of surgery.   Remember that you must have someone to transport you home after your surgery, and remain with you for 24 hours if you are discharged the same day.   Please bring cases for contacts, glasses, hearing aids, dentures or bridgework because it cannot be worn into surgery.    Leave your suitcase in the car.  After surgery it may be brought to your room.  For patients admitted to the hospital, discharge time will be determined  by your treatment team.  Patients discharged the day of surgery will not be allowed to drive home.    Special instructions:   [facility]- Preparing For Surgery  Before surgery, you can play an important role. Because skin is not sterile, your skin needs to be as free of germs as possible. You can reduce the number of germs on your skin by washing with CHG (chlorahexidine gluconate) Soap before surgery.  CHG is an antiseptic cleaner which kills germs and bonds with the skin to continue killing germs even after washing.    Oral Hygiene is also important to reduce your risk of infection.  Remember - BRUSH YOUR TEETH THE MORNING OF SURGERY WITH YOUR REGULAR TOOTHPASTE  Please do not use if you have an allergy to CHG or antibacterial soaps. If your skin becomes reddened/irritated stop using the CHG.  Do not shave (including legs and underarms) for at least 48 hours prior to first CHG shower. It is OK to shave your face.  Please follow these instructions carefully.   1. Shower the NIGHT BEFORE SURGERY and the MORNING OF SURGERY with CHG Soap.   2. If you chose to wash your hair, wash your hair first as usual with your normal shampoo.  3. After you shampoo, rinse your hair and body thoroughly to remove the shampoo.  4. Use CHG as you would any other liquid soap. You can apply CHG directly to the skin and wash gently with a scrungie or a clean washcloth.   5. Apply the CHG Soap  to your body ONLY FROM THE NECK DOWN.  Do not use on open wounds or open sores. Avoid contact with your eyes, ears, mouth and genitals (private parts). Wash Face and genitals (private parts)  with your normal soap.   6. Wash thoroughly, paying special attention to the area where your surgery will be performed.  7. Thoroughly rinse your body with warm water from the neck down.  8. DO NOT shower/wash with your normal soap after using and rinsing off the CHG Soap.  9. Pat yourself dry with a CLEAN TOWEL.  10. Wear  CLEAN PAJAMAS to bed the night before surgery, wear comfortable clothes the morning of surgery  11. Place CLEAN SHEETS on your bed the night of your first shower and DO NOT SLEEP WITH PETS.    Day of Surgery:  Please shower the morning of surgery with the CHG soap Do not apply any deodorants/lotions. Please wear clean clothes to the hospital/surgery center.   Remember to brush your teeth WITH YOUR REGULAR TOOTHPASTE.   Please read over the following fact sheets that you were given.

## 2020-01-23 ENCOUNTER — Encounter (HOSPITAL_COMMUNITY)
Admission: RE | Admit: 2020-01-23 | Discharge: 2020-01-23 | Disposition: A | Discharge status: Home / Self Care | Payer: PPO | Source: Ambulatory Visit | Attending: Neurosurgery | Admitting: Neurosurgery

## 2020-01-23 ENCOUNTER — Encounter (HOSPITAL_COMMUNITY): Payer: Self-pay

## 2020-01-23 ENCOUNTER — Other Ambulatory Visit: Payer: Self-pay

## 2020-01-23 ENCOUNTER — Other Ambulatory Visit (HOSPITAL_COMMUNITY)
Admission: RE | Admit: 2020-01-23 | Discharge: 2020-01-23 | Disposition: A | Discharge status: Home / Self Care | Payer: PPO | Source: Ambulatory Visit | Attending: Neurosurgery | Admitting: Neurosurgery

## 2020-01-23 DIAGNOSIS — E7800 Pure hypercholesterolemia, unspecified: Secondary | ICD-10-CM | POA: Diagnosis not present

## 2020-01-23 DIAGNOSIS — G3189 Other specified degenerative diseases of nervous system: Secondary | ICD-10-CM | POA: Diagnosis not present

## 2020-01-23 DIAGNOSIS — Z20822 Contact with and (suspected) exposure to covid-19: Secondary | ICD-10-CM | POA: Insufficient documentation

## 2020-01-23 DIAGNOSIS — Z79899 Other long term (current) drug therapy: Secondary | ICD-10-CM | POA: Diagnosis not present

## 2020-01-23 DIAGNOSIS — Z87891 Personal history of nicotine dependence: Secondary | ICD-10-CM | POA: Diagnosis not present

## 2020-01-23 DIAGNOSIS — Z01812 Encounter for preprocedural laboratory examination: Secondary | ICD-10-CM | POA: Diagnosis not present

## 2020-01-23 DIAGNOSIS — J45909 Unspecified asthma, uncomplicated: Secondary | ICD-10-CM | POA: Diagnosis not present

## 2020-01-23 HISTORY — DX: Other specified postprocedural states: Z98.890

## 2020-01-23 HISTORY — DX: Nausea with vomiting, unspecified: R11.2

## 2020-01-23 LAB — BASIC METABOLIC PANEL
Anion gap: 8 (ref 5–15)
BUN: 19 mg/dL (ref 8–23)
CO2: 26 mmol/L (ref 22–32)
Calcium: 9.8 mg/dL (ref 8.9–10.3)
Chloride: 104 mmol/L (ref 98–111)
Creatinine, Ser: 0.88 mg/dL (ref 0.44–1.00)
GFR calc Af Amer: >60 mL/min (ref >60)
GFR calc non Af Amer: >60 mL/min (ref >60)
Glucose, Bld: 107 mg/dL — ABNORMAL HIGH (ref 70–99)
Potassium: 4.2 mmol/L (ref 3.5–5.1)
Sodium: 138 mmol/L (ref 135–145)

## 2020-01-23 LAB — CBC
HCT: 45.8 % (ref 36.0–46.0)
Hemoglobin: 14.6 g/dL (ref 12.0–15.0)
MCH: 29.2 pg (ref 26.0–34.0)
MCHC: 31.9 g/dL (ref 30.0–36.0)
MCV: 91.6 fL (ref 80.0–100.0)
Platelets: 222 10*3/uL (ref 150–400)
RBC: 5 MIL/uL (ref 3.87–5.11)
RDW: 13.3 % (ref 11.5–15.5)
WBC: 7.7 10*3/uL (ref 4.0–10.5)
nRBC: 0 % (ref 0.0–0.2)

## 2020-01-23 LAB — TYPE AND SCREEN
ABO/RH(D): B POS
Antibody Screen: NEGATIVE

## 2020-01-23 LAB — SURGICAL PCR SCREEN
MRSA, PCR: NEGATIVE
Staphylococcus aureus: NEGATIVE

## 2020-01-23 LAB — SARS CORONAVIRUS 2 (TAT 6-24 HRS): SARS Coronavirus 2: NEGATIVE

## 2020-01-23 LAB — ABO/RH: ABO/RH(D): B POS

## 2020-01-23 NOTE — Progress Notes (Signed)
PCP - Dr. Joylene Draft Cardiologist - Dr. Harrington Challenger, sees a cardiologist for family history of cardiac disease.  Father passed away at the age of 67 from a massive MI  PPM/ICD - n/a Device Orders -  Rep Notified -   Chest x-ray - n/a EKG - n/a Stress Test - 07/20/2015 ECHO - 04/18/2013 Cardiac Cath -   Sleep Study - n/a CPAP -   Fasting Blood Sugar - n/a Checks Blood Sugar _____ times a day  Blood Thinner Instructions: n/a Aspirin Instructions:  ERAS Protcol - n/a PRE-SURGERY Ensure or G2-   COVID TEST- scheduled after PAT appointment   Anesthesia review: yes, for history of cardiac testing  Patient denies shortness of breath, fever, cough and chest pain at PAT appointment   All instructions explained to the patient, with a verbal understanding of the material. Patient agrees to go over the instructions while at home for a better understanding. Patient also instructed to self quarantine after being tested for COVID-19. The opportunity to ask questions was provided.

## 2020-01-26 NOTE — Progress Notes (Signed)
Anesthesia Chart Review:  Case: [id number] Date/Time: 01/27/20 1128   Procedures:      Left Lumbar 3-4 Lumbar 4-5 Anterolateral lumbar interbody fusion with percutaneous pedicle screw fixation (Left )     LUMBAR PERCUTANEOUS PEDICLE SCREW 2 LEVEL (N/A )   Anesthesia type: General   Pre-op diagnosis: Degenerative lumbar spinal stenosis   Location: [facility] OR ROOM 21 / [facility]   Surgeons: Erline Levine, MD      DISCUSSION: Patient is a 69 year old female scheduled for the above procedure.  History includes former smoker (quit 05/08/72), post-operative N/V, hypercholesterolemia, asthma, back surgery (1999), migraines.  One year follow-up recommended at 10/03/19 cardiology visit with Dr. Harrington Challenger.  She denied chest pain and shortness of breath at PAT RN visit.  Received second COVID-19 Pfizer vaccine 12/23/19. 01/23/20 presurgical COVID-19 test negative.  Anesthesia team to evaluate on the day of surgery.   VS: BP 123/73   Pulse 82   Temp 36.9 C (Oral)   Resp 17   Ht 5\' 3"  (1.6 m)   Wt 69.9 kg   SpO2 98%   BMI 27.32 kg/m    PROVIDERS: Crist Infante, MD is PCP  Dorris Carnes, MD is cardiologist. Last evaluation 10/03/19. She had "mild" plaquing in distal LM/mid LAD with calcium score 55th percentile for age 35/2019. No CV symptoms. Continued medical management with one year follow-up recommended.   - Last seen at [facility] on 10/28/19 by Ward Givens, NP for migraine headaches.   LABS: Labs reviewed: Acceptable for surgery. HFP WNL 12/30/10. (all labs ordered are listed, but only abnormal results are displayed)  Labs Reviewed  BASIC METABOLIC PANEL - Abnormal; Notable for the following components:      Result Value   Glucose, Bld 107 (*)    All other components within normal limits  SURGICAL PCR SCREEN  CBC  TYPE AND SCREEN     EKG: EKG 10/03/19 ([facility]): NSR, nonspecific ST and T wave abnormality.   CV: CT Cardiac calcium scoring  09/23/18: FINDINGS: Non-cardiac: See separate report from [facility]. Ascending aorta: Normal diameter 3.6 cm Pericardium: Normal Coronary arteries: Calcium noted in distal LM and mid LAD IMPRESSION: Coronary calcium score of 18. This was 3 th percentile for age and sex matched control.   Echo 04/18/13: Study Conclusions  - Left ventricle: The cavity size was mildly reduced. Wall  thickness was normal. Systolic function was normal. The  estimated ejection fraction was in the range of 55% to  60%. Wall motion was normal; there were no regional wall  motion abnormalities. Left ventricular diastolic function  parameters were normal.  - Atrial septum: No defect or patent foramen ovale was  identified.  Impressions:  - Essentially normal 2 D echo-doppler study for age.    Nuclear stress test 01/20/09 (although scanned date is 07/20/15): Impression: This is a low risk scan. Exercise capacity 10 METS. EKG is negative for ischemia. Normal myocardial perfusion scan demonstrating an attenuation artifact in the anterior region of the myocardium. The observed defect is consistent with breast attenuation artifact. No ischemia or infarct/scar seen in the remaining myocardium.   Past Medical History:  Diagnosis Date  . Adenomyosis   . Asthma    "really well controlled"  . Asthma   . Cystocele   . Fibroid   . High cholesterol   . Menopausal symptoms   . Migraine   . Migraine   . Osteopenia 2017   Resolved.  BMD normal in 2017.  Marland Kitchen  PONV (postoperative nausea and vomiting)   . Rectocele   . Urinary incontinence   . Uterine prolapse     Past Surgical History:  Procedure Laterality Date  . APPENDECTOMY    . BACK SURGERY  1999   L4-L5  . BLADDER SUSPENSION    . CATARACT EXTRACTION, BILATERAL  07/2018  . COLONOSCOPY  10/2011  . NASAL SINUS SURGERY    . TONSILLECTOMY    . VAGINAL HYSTERECTOMY  2011   Vag. Hyst BSO A/P repair-Sling    MEDICATIONS: .  amitriptyline (ELAVIL) 50 MG tablet  . Biotin 5000 MCG TABS  . budesonide-formoterol (SYMBICORT) 160-4.5 MCG/ACT inhaler  . Carboxymethylcell-Hypromellose (GENTEAL) 0.25-0.3 % GEL  . Ergocalciferol 2000 units CAPS  . estradiol (MINIVELLE) 0.0375 MG/24HR  . fexofenadine (ALLEGRA) 180 MG tablet  . Multiple Vitamin (MULTIVITAMIN WITH MINERALS) TABS tablet  . omeprazole (PRILOSEC) 10 MG capsule  . ondansetron (ZOFRAN) 4 MG tablet  . Probiotic Product (PROBIOTIC DAILY PO)  . rosuvastatin (CRESTOR) 40 MG tablet  . SUMAtriptan (IMITREX) 100 MG tablet   No current facility-administered medications for this encounter.    Myra Gianotti, PA-C Surgical Short Stay/Anesthesiology [facility] Phone [phone] [facility] Phone [phone] 01/26/2020 12:03 PM

## 2020-01-26 NOTE — Anesthesia Preprocedure Evaluation (Addendum)
Anesthesia Evaluation  Patient identified by MRN, date of birth, ID band Patient awake    Reviewed: Allergy & Precautions, NPO status , Patient's Chart, lab work & pertinent test results  History of Anesthesia Complications (+) PONV and history of anesthetic complications  Airway Mallampati: I  TM Distance: >3 FB Neck ROM: Full    Dental  (+) Teeth Intact, Dental Advisory Given   Pulmonary asthma , former smoker,    Pulmonary exam normal        Cardiovascular negative cardio ROS Normal cardiovascular exam     Neuro/Psych negative psych ROS   GI/Hepatic negative GI ROS, Neg liver ROS,   Endo/Other  negative endocrine ROS  Renal/GU negative Renal ROS  negative genitourinary   Musculoskeletal   Abdominal   Peds  Hematology   Anesthesia Other Findings   Reproductive/Obstetrics                           Anesthesia Physical Anesthesia Plan  ASA: II  Anesthesia Plan: General   Post-op Pain Management:    Induction: Intravenous  PONV Risk Score and Plan: 4 or greater and Midazolam, Dexamethasone, Ondansetron and Treatment may vary due to age or medical condition  Airway Management Planned: Oral ETT  Additional Equipment:   Intra-op Plan:   Post-operative Plan: Extubation in OR  Informed Consent: I have reviewed the patients History and Physical, chart, labs and discussed the procedure including the risks, benefits and alternatives for the proposed anesthesia with the patient or authorized representative who has indicated his/her understanding and acceptance.       Plan Discussed with: CRNA and Surgeon  Anesthesia Plan Comments: (PAT note written 01/26/2020 by Myra Gianotti, PA-C. )       Anesthesia Quick Evaluation

## 2020-01-27 ENCOUNTER — Inpatient Hospital Stay (HOSPITAL_COMMUNITY): Payer: PPO | Admitting: Anesthesiology

## 2020-01-27 ENCOUNTER — Encounter (HOSPITAL_COMMUNITY)
Admission: RE | Disposition: A | Discharge status: Home Health | Payer: Self-pay | Source: Home / Self Care | Attending: Neurosurgery

## 2020-01-27 ENCOUNTER — Inpatient Hospital Stay (HOSPITAL_COMMUNITY): Payer: PPO | Admitting: Vascular Surgery

## 2020-01-27 ENCOUNTER — Encounter (HOSPITAL_COMMUNITY): Payer: Self-pay | Admitting: Neurosurgery

## 2020-01-27 ENCOUNTER — Inpatient Hospital Stay (HOSPITAL_COMMUNITY): Payer: PPO

## 2020-01-27 ENCOUNTER — Inpatient Hospital Stay (HOSPITAL_COMMUNITY)
Admission: RE | Admit: 2020-01-27 | Discharge: 2020-01-29 | DRG: 460 | Disposition: A | Discharge status: Home Health | Payer: PPO | Attending: Neurosurgery | Admitting: Neurosurgery

## 2020-01-27 ENCOUNTER — Other Ambulatory Visit: Payer: Self-pay

## 2020-01-27 DIAGNOSIS — Z87891 Personal history of nicotine dependence: Secondary | ICD-10-CM | POA: Diagnosis not present

## 2020-01-27 DIAGNOSIS — M4326 Fusion of spine, lumbar region: Secondary | ICD-10-CM | POA: Diagnosis not present

## 2020-01-27 DIAGNOSIS — M4327 Fusion of spine, lumbosacral region: Secondary | ICD-10-CM | POA: Diagnosis present

## 2020-01-27 DIAGNOSIS — Z419 Encounter for procedure for purposes other than remedying health state, unspecified: Secondary | ICD-10-CM

## 2020-01-27 DIAGNOSIS — M48061 Spinal stenosis, lumbar region without neurogenic claudication: Principal | ICD-10-CM | POA: Diagnosis present

## 2020-01-27 DIAGNOSIS — M4316 Spondylolisthesis, lumbar region: Secondary | ICD-10-CM | POA: Diagnosis present

## 2020-01-27 DIAGNOSIS — J45909 Unspecified asthma, uncomplicated: Secondary | ICD-10-CM | POA: Diagnosis present

## 2020-01-27 DIAGNOSIS — M5416 Radiculopathy, lumbar region: Secondary | ICD-10-CM | POA: Diagnosis present

## 2020-01-27 DIAGNOSIS — M438X6 Other specified deforming dorsopathies, lumbar region: Secondary | ICD-10-CM | POA: Diagnosis not present

## 2020-01-27 DIAGNOSIS — Z880 Allergy status to penicillin: Secondary | ICD-10-CM | POA: Diagnosis not present

## 2020-01-27 HISTORY — PX: LUMBAR PERCUTANEOUS PEDICLE SCREW 2 LEVEL: SHX5561

## 2020-01-27 HISTORY — PX: ANTERIOR LAT LUMBAR FUSION: SHX1168

## 2020-01-27 SURGERY — ANTERIOR LATERAL LUMBAR FUSION 2 LEVELS
Anesthesia: General | Site: Flank

## 2020-01-27 MED ORDER — DOCUSATE SODIUM 100 MG PO CAPS
100.0000 mg | ORAL_CAPSULE | Freq: Two times a day (BID) | ORAL | Status: DC
  Administered 2020-01-27 – 2020-01-28 (×3): 100 mg via ORAL
  Filled 2020-01-27 (×4): qty 1

## 2020-01-27 MED ORDER — ACETAMINOPHEN 650 MG RE SUPP: 650.0000 mg | RECTAL | Status: DC | PRN

## 2020-01-27 MED ORDER — LACTATED RINGERS IV SOLN: INTRAVENOUS | Status: DC

## 2020-01-27 MED ORDER — CHLORHEXIDINE GLUCONATE CLOTH 2 % EX PADS: 6.0000 | MEDICATED_PAD | Freq: Once | CUTANEOUS | Status: DC

## 2020-01-27 MED ORDER — ACETAMINOPHEN 325 MG PO TABS
650.0000 mg | ORAL_TABLET | ORAL | Status: DC | PRN
  Administered 2020-01-28 (×2): 650 mg via ORAL
  Filled 2020-01-27 (×2): qty 2

## 2020-01-27 MED ORDER — HYDROXYZINE HCL 25 MG PO TABS: 25.0000 mg | ORAL_TABLET | ORAL | Status: DC | PRN

## 2020-01-27 MED ORDER — ESTRADIOL 0.05 MG/24HR TD PTWK: 0.0500 mg | MEDICATED_PATCH | TRANSDERMAL | Status: DC

## 2020-01-27 MED ORDER — FENTANYL CITRATE (PF) 100 MCG/2ML IJ SOLN
INTRAMUSCULAR | Status: DC | PRN
  Administered 2020-01-27 (×3): 50 ug via INTRAVENOUS
  Administered 2020-01-27: 150 ug via INTRAVENOUS

## 2020-01-27 MED ORDER — METHOCARBAMOL 1000 MG/10ML IJ SOLN
500.0000 mg | Freq: Four times a day (QID) | INTRAVENOUS | Status: DC | PRN
  Filled 2020-01-27: qty 5

## 2020-01-27 MED ORDER — PROPOFOL 10 MG/ML IV BOLUS
INTRAVENOUS | Status: DC | PRN
  Administered 2020-01-27: 25 ug/kg/min via INTRAVENOUS
  Administered 2020-01-27: 100 mg via INTRAVENOUS

## 2020-01-27 MED ORDER — LIDOCAINE-EPINEPHRINE 1 %-1:100000 IJ SOLN
INTRAMUSCULAR | Status: AC
  Filled 2020-01-27: qty 1

## 2020-01-27 MED ORDER — ALUM & MAG HYDROXIDE-SIMETH 200-200-20 MG/5ML PO SUSP
30.0000 mL | Freq: Four times a day (QID) | ORAL | Status: DC | PRN
  Administered 2020-01-28: 30 mL via ORAL
  Filled 2020-01-27: qty 30

## 2020-01-27 MED ORDER — VANCOMYCIN HCL IN DEXTROSE 1-5 GM/200ML-% IV SOLN
1000.0000 mg | INTRAVENOUS | Status: AC
  Administered 2020-01-27: 1000 mg via INTRAVENOUS
  Filled 2020-01-27: qty 200

## 2020-01-27 MED ORDER — HYDROMORPHONE HCL 2 MG PO TABS
2.0000 mg | ORAL_TABLET | ORAL | Status: DC | PRN
  Administered 2020-01-27 – 2020-01-29 (×9): 2 mg via ORAL
  Filled 2020-01-27 (×9): qty 1

## 2020-01-27 MED ORDER — HYDROMORPHONE HCL 1 MG/ML IJ SOLN
0.2500 mg | INTRAMUSCULAR | Status: DC | PRN
  Administered 2020-01-27: 1 mg via INTRAVENOUS

## 2020-01-27 MED ORDER — THROMBIN 5000 UNITS EX SOLR: OROMUCOSAL | Status: DC | PRN

## 2020-01-27 MED ORDER — BIOTIN 5000 MCG PO TABS: 5000.0000 ug | ORAL_TABLET | Freq: Every day | ORAL | Status: DC

## 2020-01-27 MED ORDER — BUPIVACAINE HCL (PF) 0.5 % IJ SOLN
INTRAMUSCULAR | Status: DC | PRN
  Administered 2020-01-27: 14 mL

## 2020-01-27 MED ORDER — KCL IN DEXTROSE-NACL 20-5-0.45 MEQ/L-%-% IV SOLN
INTRAVENOUS | Status: DC
  Filled 2020-01-27: qty 1000

## 2020-01-27 MED ORDER — HYPROMELLOSE (GONIOSCOPIC) 2.5 % OP SOLN
1.0000 [drp] | Freq: Every day | OPHTHALMIC | Status: DC
  Administered 2020-01-28 – 2020-01-29 (×2): 1 [drp] via OPHTHALMIC
  Filled 2020-01-27: qty 15

## 2020-01-27 MED ORDER — SUMATRIPTAN SUCCINATE 100 MG PO TABS
100.0000 mg | ORAL_TABLET | ORAL | Status: DC | PRN
  Filled 2020-01-27 (×2): qty 1

## 2020-01-27 MED ORDER — HYDROCODONE-ACETAMINOPHEN 5-325 MG PO TABS
2.0000 | ORAL_TABLET | ORAL | Status: DC | PRN
  Administered 2020-01-27: 2 via ORAL

## 2020-01-27 MED ORDER — LIDOCAINE 2% (20 MG/ML) 5 ML SYRINGE
INTRAMUSCULAR | Status: DC | PRN
  Administered 2020-01-27: 100 mg via INTRAVENOUS

## 2020-01-27 MED ORDER — HYDROMORPHONE HCL 1 MG/ML IJ SOLN
0.5000 mg | INTRAMUSCULAR | Status: DC | PRN
  Administered 2020-01-27: 0.5 mg via INTRAVENOUS
  Filled 2020-01-27: qty 0.5

## 2020-01-27 MED ORDER — VITAMIN D 25 MCG (1000 UNIT) PO TABS
4000.0000 [IU] | ORAL_TABLET | Freq: Every day | ORAL | Status: DC
  Administered 2020-01-28: 4000 [IU] via ORAL
  Filled 2020-01-27 (×2): qty 4

## 2020-01-27 MED ORDER — ERGOCALCIFEROL 50 MCG (2000 UT) PO CAPS: 4000.0000 [IU] | ORAL_CAPSULE | Freq: Every day | ORAL | Status: DC

## 2020-01-27 MED ORDER — 0.9 % SODIUM CHLORIDE (POUR BTL) OPTIME
TOPICAL | Status: DC | PRN
  Administered 2020-01-27: 13:00:00 1000 mL

## 2020-01-27 MED ORDER — SUCCINYLCHOLINE CHLORIDE 200 MG/10ML IV SOSY
PREFILLED_SYRINGE | INTRAVENOUS | Status: DC | PRN
  Administered 2020-01-27: 140 mg via INTRAVENOUS

## 2020-01-27 MED ORDER — FENTANYL CITRATE (PF) 250 MCG/5ML IJ SOLN
INTRAMUSCULAR | Status: AC
  Filled 2020-01-27: qty 5

## 2020-01-27 MED ORDER — PHENYLEPHRINE HCL-NACL 10-0.9 MG/250ML-% IV SOLN
INTRAVENOUS | Status: DC | PRN
  Administered 2020-01-27: 40 ug/min via INTRAVENOUS

## 2020-01-27 MED ORDER — OXYCODONE HCL 5 MG PO TABS: 5.0000 mg | ORAL_TABLET | ORAL | Status: DC | PRN

## 2020-01-27 MED ORDER — ONDANSETRON HCL 4 MG PO TABS: 4.0000 mg | ORAL_TABLET | Freq: Four times a day (QID) | ORAL | Status: DC | PRN

## 2020-01-27 MED ORDER — CARBOXYMETHYLCELL-HYPROMELLOSE 0.25-0.3 % OP GEL: 1.0000 [drp] | Freq: Every day | OPHTHALMIC | Status: DC

## 2020-01-27 MED ORDER — ROSUVASTATIN CALCIUM 20 MG PO TABS
40.0000 mg | ORAL_TABLET | Freq: Every day | ORAL | Status: DC
  Administered 2020-01-27 – 2020-01-28 (×2): 40 mg via ORAL
  Filled 2020-01-27 (×3): qty 2

## 2020-01-27 MED ORDER — PHENOL 1.4 % MT LIQD: 1.0000 | OROMUCOSAL | Status: DC | PRN

## 2020-01-27 MED ORDER — HYDROXYZINE HCL 50 MG/ML IM SOLN: 50.0000 mg | Freq: Four times a day (QID) | INTRAMUSCULAR | Status: DC | PRN

## 2020-01-27 MED ORDER — PHENYLEPHRINE 40 MCG/ML (10ML) SYRINGE FOR IV PUSH (FOR BLOOD PRESSURE SUPPORT)
PREFILLED_SYRINGE | INTRAVENOUS | Status: DC | PRN
  Administered 2020-01-27: 80 ug via INTRAVENOUS

## 2020-01-27 MED ORDER — PROPOFOL 10 MG/ML IV BOLUS
INTRAVENOUS | Status: AC
  Filled 2020-01-27: qty 20

## 2020-01-27 MED ORDER — THROMBIN 5000 UNITS EX SOLR
CUTANEOUS | Status: AC
  Filled 2020-01-27: qty 5000

## 2020-01-27 MED ORDER — ARTIFICIAL TEARS OPHTHALMIC OINT
TOPICAL_OINTMENT | OPHTHALMIC | Status: DC | PRN
  Administered 2020-01-27: 1 via OPHTHALMIC

## 2020-01-27 MED ORDER — HYDROCODONE-ACETAMINOPHEN 5-325 MG PO TABS
ORAL_TABLET | ORAL | Status: AC
  Filled 2020-01-27: qty 2

## 2020-01-27 MED ORDER — FLEET ENEMA 7-19 GM/118ML RE ENEM: 1.0000 | ENEMA | Freq: Once | RECTAL | Status: DC | PRN

## 2020-01-27 MED ORDER — MOMETASONE FURO-FORMOTEROL FUM 200-5 MCG/ACT IN AERO
2.0000 | INHALATION_SPRAY | Freq: Two times a day (BID) | RESPIRATORY_TRACT | Status: DC
  Filled 2020-01-27: qty 8.8

## 2020-01-27 MED ORDER — POLYETHYLENE GLYCOL 3350 17 G PO PACK: 17.0000 g | PACK | Freq: Every day | ORAL | Status: DC | PRN

## 2020-01-27 MED ORDER — SODIUM CHLORIDE 0.9% FLUSH: 3.0000 mL | Freq: Two times a day (BID) | INTRAVENOUS | Status: DC

## 2020-01-27 MED ORDER — HYDROCODONE-ACETAMINOPHEN 5-325 MG PO TABS: 2.0000 | ORAL_TABLET | ORAL | Status: DC | PRN

## 2020-01-27 MED ORDER — RISAQUAD PO CAPS
ORAL_CAPSULE | Freq: Every day | ORAL | Status: DC
  Administered 2020-01-28: 1 via ORAL
  Filled 2020-01-27 (×2): qty 1

## 2020-01-27 MED ORDER — METHOCARBAMOL 500 MG PO TABS
500.0000 mg | ORAL_TABLET | Freq: Four times a day (QID) | ORAL | Status: DC | PRN
  Administered 2020-01-27 – 2020-01-29 (×7): 500 mg via ORAL
  Filled 2020-01-27 (×6): qty 1

## 2020-01-27 MED ORDER — MIDAZOLAM HCL 2 MG/2ML IJ SOLN
INTRAMUSCULAR | Status: AC
  Filled 2020-01-27: qty 2

## 2020-01-27 MED ORDER — ZOLPIDEM TARTRATE 5 MG PO TABS: 5.0000 mg | ORAL_TABLET | Freq: Every evening | ORAL | Status: DC | PRN

## 2020-01-27 MED ORDER — VANCOMYCIN HCL IN DEXTROSE 1-5 GM/200ML-% IV SOLN
1000.0000 mg | Freq: Once | INTRAVENOUS | Status: AC
  Administered 2020-01-27: 1000 mg via INTRAVENOUS
  Filled 2020-01-27: qty 200

## 2020-01-27 MED ORDER — ONDANSETRON HCL 4 MG/2ML IJ SOLN
INTRAMUSCULAR | Status: AC
  Filled 2020-01-27: qty 2

## 2020-01-27 MED ORDER — LIDOCAINE-EPINEPHRINE 1 %-1:100000 IJ SOLN
INTRAMUSCULAR | Status: DC | PRN
  Administered 2020-01-27: 14 mL

## 2020-01-27 MED ORDER — ONDANSETRON HCL 4 MG/2ML IJ SOLN
INTRAMUSCULAR | Status: DC | PRN
  Administered 2020-01-27: 4 mg via INTRAVENOUS

## 2020-01-27 MED ORDER — BISACODYL 10 MG RE SUPP: 10.0000 mg | Freq: Every day | RECTAL | Status: DC | PRN

## 2020-01-27 MED ORDER — ONDANSETRON HCL 4 MG/2ML IJ SOLN
4.0000 mg | Freq: Once | INTRAMUSCULAR | Status: AC | PRN
  Administered 2020-01-27: 4 mg via INTRAVENOUS

## 2020-01-27 MED ORDER — MEPERIDINE HCL 25 MG/ML IJ SOLN: 6.2500 mg | INTRAMUSCULAR | Status: DC | PRN

## 2020-01-27 MED ORDER — PANTOPRAZOLE SODIUM 40 MG PO TBEC: 40.0000 mg | DELAYED_RELEASE_TABLET | Freq: Every day | ORAL | Status: DC

## 2020-01-27 MED ORDER — MIDAZOLAM HCL 5 MG/5ML IJ SOLN
INTRAMUSCULAR | Status: DC | PRN
  Administered 2020-01-27: 2 mg via INTRAVENOUS

## 2020-01-27 MED ORDER — MENTHOL 3 MG MT LOZG: 1.0000 | LOZENGE | OROMUCOSAL | Status: DC | PRN

## 2020-01-27 MED ORDER — METHOCARBAMOL 500 MG PO TABS
ORAL_TABLET | ORAL | Status: AC
  Filled 2020-01-27: qty 1

## 2020-01-27 MED ORDER — ADULT MULTIVITAMIN W/MINERALS CH
1.0000 | ORAL_TABLET | Freq: Every day | ORAL | Status: DC
  Administered 2020-01-28: 1 via ORAL
  Filled 2020-01-27 (×2): qty 1

## 2020-01-27 MED ORDER — LORATADINE 10 MG PO TABS
10.0000 mg | ORAL_TABLET | Freq: Every day | ORAL | Status: DC
  Administered 2020-01-28: 10 mg via ORAL
  Filled 2020-01-27 (×2): qty 1

## 2020-01-27 MED ORDER — HYDROMORPHONE HCL 1 MG/ML IJ SOLN
INTRAMUSCULAR | Status: AC
  Filled 2020-01-27: qty 1

## 2020-01-27 MED ORDER — LIDOCAINE 2% (20 MG/ML) 5 ML SYRINGE
INTRAMUSCULAR | Status: AC
  Filled 2020-01-27: qty 5

## 2020-01-27 MED ORDER — ONDANSETRON HCL 4 MG/2ML IJ SOLN: 4.0000 mg | Freq: Four times a day (QID) | INTRAMUSCULAR | Status: DC | PRN

## 2020-01-27 MED ORDER — AMITRIPTYLINE HCL 50 MG PO TABS
100.0000 mg | ORAL_TABLET | Freq: Every day | ORAL | Status: DC
  Administered 2020-01-27 – 2020-01-28 (×2): 100 mg via ORAL
  Filled 2020-01-27 (×2): qty 1
  Filled 2020-01-27 (×2): qty 2

## 2020-01-27 MED ORDER — ONDANSETRON HCL 4 MG PO TABS: 4.0000 mg | ORAL_TABLET | Freq: Three times a day (TID) | ORAL | Status: DC | PRN

## 2020-01-27 MED ORDER — PANTOPRAZOLE SODIUM 40 MG IV SOLR: 40.0000 mg | Freq: Every day | INTRAVENOUS | Status: DC

## 2020-01-27 MED ORDER — SUCCINYLCHOLINE CHLORIDE 200 MG/10ML IV SOSY
PREFILLED_SYRINGE | INTRAVENOUS | Status: AC
  Filled 2020-01-27: qty 10

## 2020-01-27 MED ORDER — BUPIVACAINE HCL (PF) 0.5 % IJ SOLN
INTRAMUSCULAR | Status: AC
  Filled 2020-01-27: qty 30

## 2020-01-27 MED ORDER — DEXAMETHASONE SODIUM PHOSPHATE 10 MG/ML IJ SOLN
INTRAMUSCULAR | Status: DC | PRN
  Administered 2020-01-27: 10 mg via INTRAVENOUS

## 2020-01-27 MED ORDER — DEXAMETHASONE SODIUM PHOSPHATE 10 MG/ML IJ SOLN
INTRAMUSCULAR | Status: AC
  Filled 2020-01-27: qty 1

## 2020-01-27 MED ORDER — SODIUM CHLORIDE 0.9% FLUSH: 3.0000 mL | INTRAVENOUS | Status: DC | PRN

## 2020-01-27 MED ORDER — SODIUM CHLORIDE 0.9 % IV SOLN: 250.0000 mL | INTRAVENOUS | Status: DC

## 2020-01-27 MED ORDER — PHENYLEPHRINE 40 MCG/ML (10ML) SYRINGE FOR IV PUSH (FOR BLOOD PRESSURE SUPPORT)
PREFILLED_SYRINGE | INTRAVENOUS | Status: AC
  Filled 2020-01-27: qty 10

## 2020-01-27 SURGICAL SUPPLY — 83 items
ADH SKN CLS APL DERMABOND .7 (GAUZE/BANDAGES/DRESSINGS) ×4
BLADE CLIPPER SURG (BLADE) IMPLANT
CAGE MODULUS XL 10X18X50 - 10 (Cage) ×2 IMPLANT
CAGE MODULUS XL 12X18X55 - 10 (Cage) ×2 IMPLANT
CARTRIDGE OIL MAESTRO DRILL (MISCELLANEOUS) ×2 IMPLANT
CLIP NEUROVISION LG (CLIP) ×2 IMPLANT
CNTNR URN SCR LID CUP LEK RST (MISCELLANEOUS) ×2 IMPLANT
CONT SPEC 4OZ STRL OR WHT (MISCELLANEOUS) ×4
COVER BACK TABLE 60X90IN (DRAPES) ×4 IMPLANT
COVER WAND RF STERILE (DRAPES) ×4 IMPLANT
DECANTER SPIKE VIAL GLASS SM (MISCELLANEOUS) ×6 IMPLANT
DERMABOND ADVANCED (GAUZE/BANDAGES/DRESSINGS) ×4
DERMABOND ADVANCED .7 DNX12 (GAUZE/BANDAGES/DRESSINGS) ×4 IMPLANT
DIFFUSER DRILL AIR PNEUMATIC (MISCELLANEOUS) ×2 IMPLANT
DRAPE C-ARM 42X72 X-RAY (DRAPES) ×8 IMPLANT
DRAPE C-ARMOR (DRAPES) ×8 IMPLANT
DRAPE LAPAROTOMY 100X72X124 (DRAPES) ×8 IMPLANT
DRAPE SURG 17X23 STRL (DRAPES) ×4 IMPLANT
DRSG OPSITE POSTOP 3X4 (GAUZE/BANDAGES/DRESSINGS) ×4 IMPLANT
DRSG OPSITE POSTOP 4X8 (GAUZE/BANDAGES/DRESSINGS) ×6 IMPLANT
DURAPREP 26ML APPLICATOR (WOUND CARE) ×8 IMPLANT
ELECT REM PT RETURN 9FT ADLT (ELECTROSURGICAL) ×8
ELECTRODE REM PT RTRN 9FT ADLT (ELECTROSURGICAL) ×4 IMPLANT
GAUZE 4X4 16PLY RFD (DISPOSABLE) ×2 IMPLANT
GAUZE SPONGE 4X4 12PLY STRL (GAUZE/BANDAGES/DRESSINGS) ×2 IMPLANT
GLOVE BIO SURGEON STRL SZ 6.5 (GLOVE) ×2 IMPLANT
GLOVE BIO SURGEON STRL SZ8 (GLOVE) ×12 IMPLANT
GLOVE BIO SURGEONS STRL SZ 6.5 (GLOVE) ×2
GLOVE BIOGEL PI IND STRL 6.5 (GLOVE) IMPLANT
GLOVE BIOGEL PI IND STRL 7.5 (GLOVE) IMPLANT
GLOVE BIOGEL PI IND STRL 8 (GLOVE) ×4 IMPLANT
GLOVE BIOGEL PI IND STRL 8.5 (GLOVE) ×6 IMPLANT
GLOVE BIOGEL PI INDICATOR 6.5 (GLOVE) ×2
GLOVE BIOGEL PI INDICATOR 7.5 (GLOVE) ×2
GLOVE BIOGEL PI INDICATOR 8 (GLOVE) ×8
GLOVE BIOGEL PI INDICATOR 8.5 (GLOVE) ×6
GLOVE ECLIPSE 7.5 STRL STRAW (GLOVE) ×6 IMPLANT
GLOVE ECLIPSE 8.0 STRL XLNG CF (GLOVE) ×8 IMPLANT
GLOVE ECLIPSE 9.0 STRL (GLOVE) ×2 IMPLANT
GLOVE EXAM NITRILE XL STR (GLOVE) IMPLANT
GOWN STRL REUS W/ TWL LRG LVL3 (GOWN DISPOSABLE) IMPLANT
GOWN STRL REUS W/ TWL XL LVL3 (GOWN DISPOSABLE) ×6 IMPLANT
GOWN STRL REUS W/TWL 2XL LVL3 (GOWN DISPOSABLE) ×8 IMPLANT
GOWN STRL REUS W/TWL LRG LVL3 (GOWN DISPOSABLE) ×4
GOWN STRL REUS W/TWL XL LVL3 (GOWN DISPOSABLE) ×16
GUIDEWIRE NITINOL BEVEL TIP (WIRE) ×12 IMPLANT
HEMOSTAT POWDER KIT SURGIFOAM (HEMOSTASIS) ×2 IMPLANT
KIT BASIN OR (CUSTOM PROCEDURE TRAY) ×6 IMPLANT
KIT DILATOR XLIF 5 (KITS) ×1 IMPLANT
KIT INFUSE X SMALL 1.4CC (Orthopedic Implant) ×2 IMPLANT
KIT POSITION SURG JACKSON T1 (MISCELLANEOUS) ×4 IMPLANT
KIT SURGICAL ACCESS MAXCESS 4 (KITS) ×2 IMPLANT
KIT TURNOVER KIT B (KITS) ×6 IMPLANT
KIT XLIF (KITS) ×1
MARKER SKIN DUAL TIP RULER LAB (MISCELLANEOUS) ×6 IMPLANT
MODULE NVM5 NEXT GEN EMG (NEEDLE) ×2 IMPLANT
NDL HYPO 25X1 1.5 SAFETY (NEEDLE) ×4 IMPLANT
NDL I PASS (NEEDLE) IMPLANT
NEEDLE HYPO 25X1 1.5 SAFETY (NEEDLE) ×8 IMPLANT
NEEDLE I PASS (NEEDLE) ×4 IMPLANT
NS IRRIG 1000ML POUR BTL (IV SOLUTION) ×6 IMPLANT
OIL CARTRIDGE MAESTRO DRILL (MISCELLANEOUS)
PACK LAMINECTOMY NEURO (CUSTOM PROCEDURE TRAY) ×8 IMPLANT
PAD ARMBOARD 7.5X6 YLW CONV (MISCELLANEOUS) ×14 IMPLANT
PATTIES SURGICAL .5 X.5 (GAUZE/BANDAGES/DRESSINGS) IMPLANT
PATTIES SURGICAL .5 X1 (DISPOSABLE) IMPLANT
PATTIES SURGICAL 1X1 (DISPOSABLE) IMPLANT
PUTTY BONE ATTRAX 10CC STRIP (Putty) ×2 IMPLANT
ROD RELINE MAS LORD 5.5X75MM (Rod) ×2 IMPLANT
ROD SPINAL 5.5X80 TI LORDOSE (Rod) ×2 IMPLANT
SCREW LOCK RELINE 5.5 TULIP (Screw) ×12 IMPLANT
SCREW MAS RELINE 6.5X45 POLY (Screw) ×12 IMPLANT
SPONGE LAP 4X18 RFD (DISPOSABLE) IMPLANT
STAPLER SKIN PROX WIDE 3.9 (STAPLE) ×4 IMPLANT
SUT VIC AB 1 CT1 18XBRD ANBCTR (SUTURE) ×6 IMPLANT
SUT VIC AB 1 CT1 8-18 (SUTURE) ×16
SUT VIC AB 2-0 CT1 18 (SUTURE) ×10 IMPLANT
SUT VIC AB 3-0 SH 8-18 (SUTURE) ×14 IMPLANT
SYR TB 1ML 25GX5/8 (SYRINGE) IMPLANT
TOWEL GREEN STERILE (TOWEL DISPOSABLE) ×6 IMPLANT
TOWEL GREEN STERILE FF (TOWEL DISPOSABLE) ×4 IMPLANT
TRAY FOLEY MTR SLVR 16FR STAT (SET/KITS/TRAYS/PACK) ×6 IMPLANT
WATER STERILE IRR 1000ML POUR (IV SOLUTION) ×6 IMPLANT

## 2020-01-27 NOTE — Progress Notes (Signed)
Orthopedic Tech Progress Note Patient Details:  Courtney Horne 12/05/1950 [id number] RN said patient has brace Patient ID: [name], female   DOB: 12-Sep-1951, 69 y.o.   MRN: [id number]   Janit Pagan 01/27/2020, 5:47 PM

## 2020-01-27 NOTE — Anesthesia Postprocedure Evaluation (Signed)
Anesthesia Post Note  Patient: Courtney Horne  Procedure(s) Performed: Left Lumbar three-four Lumbar four-five Anterolateral lumbar interbody fusion (Left Flank) Percutaneous Pedicle Screw Fixation Lumbar three-Lumbar five (N/A Back)     Patient location during evaluation: PACU Anesthesia Type: General Level of consciousness: awake and alert Pain management: pain level controlled Vital Signs Assessment: post-procedure vital signs reviewed and stable Respiratory status: spontaneous breathing, nonlabored ventilation, respiratory function stable and patient connected to nasal cannula oxygen Cardiovascular status: blood pressure returned to baseline and stable Postop Assessment: no apparent nausea or vomiting Anesthetic complications: no    Last Vitals:  Vitals:   01/27/20 1617 01/27/20 1645  BP: (!) 116/58 (P) 135/66  Pulse: 81   Resp: 13   Temp: 36.7 C   SpO2: 99%     Last Pain:  Vitals:   01/27/20 1645  PainSc: (P) Asleep                 Zoanne Newill DAVID

## 2020-01-27 NOTE — Brief Op Note (Signed)
01/27/2020  4:10 PM  PATIENT:  Courtney Horne  68 y.o. female  PRE-OPERATIVE DIAGNOSIS:  Degenerative lumbar spinal stenosis, spondylolisthesis, lumbago, radiculopathy L 34 and L 45 levels, with sagittal imbalance  POST-OPERATIVE DIAGNOSIS:  Degenerative lumbar spinal stenosis, spondylolisthesis, lumbago, radiculopathy L 34 and L 45 levels, with sagittal imbalance   PROCEDURE:  Procedure(s): Left Lumbar three-four Lumbar four-five Anterolateral lumbar interbody fusion (Left) Percutaneous Pedicle Screw Fixation Lumbar three-Lumbar five (N/A)  SURGEON:  Surgeon(s) and Role:    Erline Levine, MD - Primary    * Earnie Larsson, MD - Assisting  PHYSICIAN ASSISTANT:   ASSISTANTS: P[place]at, RN   ANESTHESIA:   general  EBL:  75 mL   BLOOD ADMINISTERED:none  DRAINS: none   LOCAL MEDICATIONS USED:  MARCAINE    and LIDOCAINE   SPECIMEN:  No Specimen  DISPOSITION OF SPECIMEN:  N/A  COUNTS:  YES  TOURNIQUET:  * No tourniquets in log *  DICTATION: Patient is a 69 year old with severe stenosis and spondylolisthesis of the lumbar spine. It was elected to take her to surgery for anterolateral decompression and posterior pedicle screw fixation.  The patient has an auto-fusion at L 5 S 1.  Procedure: Patient was brought to the operating room and placed in a  lateral decubitus position on the operative table and using orthogonally projected C-arm fluoroscopy the patient was placed so that the L3-4 and L 451 levels were visualized in AP and lateral plane. The patient was then taped into position. Courtney Horne was utilized to decrease radiation exposure.  The table was flexed so as to expose the L 45 level as the patient has a high iliac crest. Skin was marked along with a posterior finger dissection incision. Her flank was then prepped and draped in usual sterile fashion and incisions were made sequentially at L 45 and L3-4 levels. Posterior finger dissection was made to enter the retroperitoneal space  and then subsequently the probe was inserted into the psoas muscle from the left side initially at the L 45 level. After mapping the neural elements were able to dock the probe per the midpoint of this vertebral level and without indications electrically of too close proximity to the neural tissues. Subsequently the self-retaining tractor was.after sequential dilators were utilized the shim was employed and the interspace was cleared of psoas muscle and then incised. A thorough discectomy was performed. Angled instruments were used to clear the interspace of disc material.An anterior entry with posterior trajectory was performed to avoid neural elements.   After thorough discectomy was performed and this was performed using AP and lateral fluoroscopy a 12 lordotic by 55 x 18 mm implant was packed with extra small BMP and Attrax. This was tamped into position and its position was confirmed on AP and lateral fluoroscopy. Subsequently exposure was performed at the L3-4 level and similar dissection was performed with locking of the self-retaining retractor. At this level were able to place a 10 lodotic x 18 x 22mm implant packed in a similar fashion.  Hemostasis was assured the wounds were irrigated and closed with interrupted Vicryl sutures.  Sterile occlusive dressings were placed. Retractor times were:  L 45: 30 minutes;  L 34: 20 minutes.   Patient was then turned into a prone position on the [facility] operating table and using AP and lateral fluoroscopy with LessRay throughout this portion of the procedure, pedicle screws were placed using Reline Nuvasive cannulated percutaneous screws. 2 screws were placed at  L3, L 4,  L 5  (6.5 x 45). 75 mm rod was then affixed to the screw heads do a separate stab incision and locked down on the screws on the left and 80 mm rod on the right. All connections were then torqued and the Towers were disassembled. The wounds were irrigated and then closed with 1, 2-0 and 3-0 Vicryl  stitches. Sterile occlusive dressing was placed with Dermabond and occlusive dressings. The patient was then extubated in the operating room and taken to recovery in stable and satisfactory condition having tolerated her operation well. Counts were correct at the end of the case.   PLAN OF CARE: Admit to inpatient   PATIENT DISPOSITION:  PACU - hemodynamically stable.   Delay start of Pharmacological VTE agent (>24hrs) due to surgical blood loss or risk of bleeding: yes

## 2020-01-27 NOTE — Interval H&P Note (Signed)
History and Physical Interval Note:  01/27/2020 10:46 AM  Courtney Horne  has presented today for surgery, with the diagnosis of Degenerative lumbar spinal stenosis.  The various methods of treatment have been discussed with the patient and family. After consideration of risks, benefits and other options for treatment, the patient has consented to  Procedure(s): Left Lumbar 3-4 Lumbar 4-5 Anterolateral lumbar interbody fusion with percutaneous pedicle screw fixation (Left) LUMBAR PERCUTANEOUS PEDICLE SCREW 2 LEVEL (N/A) as a surgical intervention.  The patient's history has been reviewed, patient examined, no change in status, stable for surgery.  I have reviewed the patient's chart and labs.  Questions were answered to the patient's satisfaction.     Peggyann Shoals

## 2020-01-27 NOTE — Transfer of Care (Signed)
Immediate Anesthesia Transfer of Care Note  Patient: Courtney Horne  Procedure(s) Performed: Left Lumbar three-four Lumbar four-five Anterolateral lumbar interbody fusion (Left Flank) Percutaneous Pedicle Screw Fixation Lumbar three-Lumbar five (N/A Back)  Patient Location: PACU  Anesthesia Type:General  Level of Consciousness: oriented, drowsy and patient cooperative  Airway & Oxygen Therapy: Patient Spontanous Breathing and Patient connected to face mask oxygen  Post-op Assessment: Report given to RN and Post -op Vital signs reviewed and stable  Post vital signs: Reviewed  Last Vitals:  Vitals Value Taken Time  BP 116/58 01/27/20 1619  Temp    Pulse 86 01/27/20 1622  Resp 16 01/27/20 1622  SpO2 100 % 01/27/20 1622  Vitals shown include unvalidated device data.  Last Pain:  Vitals:   01/27/20 1617  PainSc: 0-No pain         Complications: No apparent anesthesia complications

## 2020-01-27 NOTE — Op Note (Signed)
01/27/2020  4:10 PM  PATIENT:  Courtney Horne  69 y.o. female  PRE-OPERATIVE DIAGNOSIS:  Degenerative lumbar spinal stenosis, spondylolisthesis, lumbago, radiculopathy L 34 and L 45 levels, with sagittal imbalance  POST-OPERATIVE DIAGNOSIS:  Degenerative lumbar spinal stenosis, spondylolisthesis, lumbago, radiculopathy L 34 and L 45 levels, with sagittal imbalance   PROCEDURE:  Procedure(s): Left Lumbar three-four Lumbar four-five Anterolateral lumbar interbody fusion (Left) Percutaneous Pedicle Screw Fixation Lumbar three-Lumbar five (N/A)  SURGEON:  Surgeon(s) and Role:    Erline Levine, MD - Primary    * Earnie Larsson, MD - Assisting  PHYSICIAN ASSISTANT:   ASSISTANTS: [place], RN   ANESTHESIA:   general  EBL:  75 mL   BLOOD ADMINISTERED:none  DRAINS: none   LOCAL MEDICATIONS USED:  MARCAINE    and LIDOCAINE   SPECIMEN:  No Specimen  DISPOSITION OF SPECIMEN:  N/A  COUNTS:  YES  TOURNIQUET:  * No tourniquets in log *  DICTATION: Patient is a 69 year old with severe stenosis and spondylolisthesis of the lumbar spine. It was elected to take her to surgery for anterolateral decompression and posterior pedicle screw fixation.  The patient has an auto-fusion at L 5 S 1.  Procedure: Patient was brought to the operating room and placed in a  lateral decubitus position on the operative table and using orthogonally projected C-arm fluoroscopy the patient was placed so that the L3-4 and L 451 levels were visualized in AP and lateral plane. The patient was then taped into position. Courtney Horne was utilized to decrease radiation exposure.  The table was flexed so as to expose the L 45 level as the patient has a high iliac crest. Skin was marked along with a posterior finger dissection incision. Her flank was then prepped and draped in usual sterile fashion and incisions were made sequentially at L 45 and L3-4 levels. Posterior finger dissection was made to enter the retroperitoneal space  and then subsequently the probe was inserted into the psoas muscle from the left side initially at the L 45 level. After mapping the neural elements were able to dock the probe per the midpoint of this vertebral level and without indications electrically of too close proximity to the neural tissues. Subsequently the self-retaining tractor was.after sequential dilators were utilized the shim was employed and the interspace was cleared of psoas muscle and then incised. A thorough discectomy was performed. Angled instruments were used to clear the interspace of disc material.An anterior entry with posterior trajectory was performed to avoid neural elements.   After thorough discectomy was performed and this was performed using AP and lateral fluoroscopy a 12 lordotic by 55 x 18 mm implant was packed with extra small BMP and Attrax. This was tamped into position and its position was confirmed on AP and lateral fluoroscopy. Subsequently exposure was performed at the L3-4 level and similar dissection was performed with locking of the self-retaining retractor. At this level were able to place a 10 lodotic x 18 x 58mm implant packed in a similar fashion.  Hemostasis was assured the wounds were irrigated and closed with interrupted Vicryl sutures.  Sterile occlusive dressings were placed. Retractor times were:  L 45: 30 minutes;  L 34: 20 minutes.   Patient was then turned into a prone position on the [place] operating table and using AP and lateral fluoroscopy with LessRay throughout this portion of the procedure, pedicle screws were placed using Reline Nuvasive cannulated percutaneous screws. 2 screws were placed at  L3, L 4,  L 5  (6.5 x 45). 75 mm rod was then affixed to the screw heads do a separate stab incision and locked down on the screws on the left and 80 mm rod on the right. All connections were then torqued and the Towers were disassembled. The wounds were irrigated and then closed with 1, 2-0 and 3-0 Vicryl  stitches. Sterile occlusive dressing was placed with Dermabond and occlusive dressings. The patient was then extubated in the operating room and taken to recovery in stable and satisfactory condition having tolerated her operation well. Counts were correct at the end of the case.   PLAN OF CARE: Admit to inpatient   PATIENT DISPOSITION:  PACU - hemodynamically stable.   Delay start of Pharmacological VTE agent (>24hrs) due to surgical blood loss or risk of bleeding: yes

## 2020-01-27 NOTE — Anesthesia Procedure Notes (Signed)
Procedure Name: Intubation Date/Time: 01/27/2020 11:33 AM Performed by: Jenne Campus, CRNA Pre-anesthesia Checklist: Patient identified, Emergency Drugs available, Suction available and Patient being monitored Patient Re-evaluated:Patient Re-evaluated prior to induction Oxygen Delivery Method: Circle System Utilized Preoxygenation: Pre-oxygenation with 100% oxygen Induction Type: IV induction Ventilation: Mask ventilation without difficulty Laryngoscope Size: Miller and 2 Grade View: Grade I Tube type: Oral Tube size: 7.0 mm Number of attempts: 1 Airway Equipment and Method: Stylet Placement Confirmation: ETT inserted through vocal cords under direct vision,  positive ETCO2 and breath sounds checked- equal and bilateral Secured at: 21 cm Tube secured with: Tape Dental Injury: Teeth and Oropharynx as per pre-operative assessment

## 2020-01-27 NOTE — Progress Notes (Signed)
Pharmacy Antibiotic Note  Courtney Horne is a 69 y.o. female admitted on 01/27/2020 with degenerative lumbar spinal stenosis, spondylolisthesis, lumbago, radiculopathy L 34 and L 45 levels, with sagittal imbalance. Pt is S/P lumbar fusion this afternoon Pharmacy has been consulted for vancomycin dosing for post op surgical prophylaxis.  Pt rec'd vancomycin 1 gm IV X 1 pre-op at 10:22 AM today. Pt has no drains in place post op.   WBC 7.7, afebrile, Scr 0.88, CrCl 55 ml/min  Plan: Vancomycin 1 gm IV X 1 at 22:30 PM this evening (~12 hrs after pre op dose of vancomycin)  Height: 5\' 3"  (160 cm) Weight: 150 lb (68 kg) IBW/kg (Calculated) : 52.4  Temp (24hrs), Avg:97.7 F (36.5 C), Min:97.3 F (36.3 C), Max:98 F (36.7 C)  Recent Labs  Lab 01/23/20 1206  WBC 7.7  CREATININE 0.88    Estimated Creatinine Clearance: 55 mL/min (by C-G formula based on SCr of 0.88 mg/dL).    Allergies  Allergen Reactions  . Penicillins Itching and Rash    Did it involve swelling of the face/tongue/throat, SOB, or low BP? No Did it involve sudden or severe rash/hives, skin peeling, or any reaction on the inside of your mouth or nose? No Did you need to seek medical attention at a hospital or doctor's office? No When did it last happen?15 years ago If all above answers are "NO", may proceed with cephalosporin use.   . Zocor [Simvastatin] Other (See Comments)    UNSPECIFIED REACTION   . Codeine Nausea And Vomiting  . Tramadol Hcl Itching    Microbiology results: 3/5 MRSA PCR: negative 3/5 COVID: negative  Thank you for allowing pharmacy to be a part of this patient's care.  Gillermina Hu, PharmD, BCPS, [facility] Clinical Pharmacist 01/27/2020 5:41 PM

## 2020-01-28 ENCOUNTER — Encounter: Payer: Self-pay | Admitting: *Deleted

## 2020-01-28 MED ORDER — GABAPENTIN 300 MG PO CAPS
300.0000 mg | ORAL_CAPSULE | Freq: Three times a day (TID) | ORAL | Status: DC
  Administered 2020-01-28 (×3): 300 mg via ORAL
  Filled 2020-01-28 (×4): qty 1

## 2020-01-28 MED ORDER — PANTOPRAZOLE SODIUM 20 MG PO TBEC
20.0000 mg | DELAYED_RELEASE_TABLET | Freq: Two times a day (BID) | ORAL | Status: DC
  Administered 2020-01-28 (×2): 20 mg via ORAL
  Filled 2020-01-28 (×3): qty 1

## 2020-01-28 NOTE — Progress Notes (Addendum)
Subjective: Patient reports "I hurt some in my back and this (left) knee and thigh"  Objective: Vital signs in last 24 hours: Temp:  [97.3 F (36.3 C)-98.4 F (36.9 C)] 98.2 F (36.8 C) (03/10 0523) Pulse Rate:  [71-92] 71 (03/10 0523) Resp:  [13-19] 18 (03/10 0523) BP: (99-157)/(53-72) 112/53 (03/10 0523) SpO2:  [97 %-100 %] 100 % (03/10 0523) Weight:  [68 kg] 68 kg (03/09 1006)  Intake/Output from previous day: 03/09 0701 - 03/10 0700 In: 2601.3 [I.V.:2601.3] Out: 1450 [Urine:1375; Blood:75] Intake/Output this shift: No intake/output data recorded.  Alert, conversant. Ambulated with LSO short distance overnight. Good strength BLE. incisions without erythema, swelling or drainage beneath honeycomb and Dermabond back and left side.   Lab Results: No results for input(s): WBC, HGB, HCT, PLT in the last 72 hours. BMET No results for input(s): NA, K, CL, CO2, GLUCOSE, BUN, CREATININE, CALCIUM in the last 72 hours.  Studies/Results: DG Lumbar Spine Complete  Result Date: 01/27/2020 CLINICAL DATA:  Lumbar fusion EXAM: DG C-ARM 1-60 MIN; LUMBAR SPINE - COMPLETE 4+ VIEW COMPARISON:  11/05/2019 FLUOROSCOPY TIME:  Fluoroscopy Time:  3 minutes 11 seconds Radiation Exposure Index (if provided by the fluoroscopic device): Not available Number of Acquired Spot Images: 7 FINDINGS: Interbody fusion is noted at L3-4 and L4-5. Minimal anterolisthesis is noted of L4 on L5 stable from the prior examination. IMPRESSION: Interval fusion at L3-4 and L4-5. Electronically Signed   By: Inez Catalina M.D.   On: 01/27/2020 16:29   DG C-Arm 1-60 Min  Result Date: 01/27/2020 CLINICAL DATA:  Lumbar fusion EXAM: DG C-ARM 1-60 MIN; LUMBAR SPINE - COMPLETE 4+ VIEW COMPARISON:  11/05/2019 FLUOROSCOPY TIME:  Fluoroscopy Time:  3 minutes 11 seconds Radiation Exposure Index (if provided by the fluoroscopic device): Not available Number of Acquired Spot Images: 7 FINDINGS: Interbody fusion is noted at L3-4 and L4-5.  Minimal anterolisthesis is noted of L4 on L5 stable from the prior examination. IMPRESSION: Interval fusion at L3-4 and L4-5. Electronically Signed   By: Inez Catalina M.D.   On: 01/27/2020 16:29    Assessment/Plan: improving  LOS: 1 day  Mobilize in brace with PT.    Verdis Prime 01/28/2020, 7:11 AM  Strength is full.  Some left thigh weakness, similar pain to preop.  Mobilize, start gabapentin.

## 2020-01-28 NOTE — Evaluation (Signed)
Physical Therapy Evaluation Patient Details Name: Courtney Horne MRN: [id number] DOB: 02-23-1951 Today's Date: 01/28/2020   History of Present Illness  Pt with L sided radicular pain. Pt underwent anterior lateral L Lumbar L3-4, L4-5 fusion. PMH: unremarkable.  Clinical Impression  Patient is s/p above surgery resulting in the deficits listed below (see PT Problem List). Pt c/o L LE weakness and pain. Requiring assist for adls, transfers, and stair negotaition. Patient will benefit from skilled PT to increase their independence and safety with mobility (while adhering to their precautions) to allow discharge to the venue listed below.     Follow Up Recommendations Home health PT;Supervision - Intermittent    Equipment Recommendations  Rolling walker with 5" wheels    Recommendations for Other Services       Precautions / Restrictions Precautions Precautions: Back Precaution Booklet Issued: Yes (comment) Precaution Comments: pt educated with good understanding Required Braces or Orthoses: Spinal Brace Spinal Brace: Lumbar corset;Applied in sitting position Restrictions Weight Bearing Restrictions: No      Mobility  Bed Mobility Overal bed mobility: Needs Assistance Bed Mobility: Rolling;Sidelying to Sit;Sit to Sidelying Rolling: Min guard Sidelying to sit: Min assist     Sit to sidelying: Mod assist General bed mobility comments: max directional verbal cues to log roll, minA for L LE mangement off bed and modA to bring LEs back up into bed due to L LE weakness  Transfers Overall transfer level: Needs assistance Equipment used: Rolling walker (2 wheeled) Transfers: Sit to/from Stand Sit to Stand: Min guard         General transfer comment: verbal cues for safe hand placement, increased time, verbal cues to reach back for surface she's sitting on  Ambulation/Gait Ambulation/Gait assistance: Min guard Gait Distance (Feet): 150 Feet Assistive device: Rolling  walker (2 wheeled) Gait Pattern/deviations: Step-through pattern;Decreased stride length Gait velocity: slow Gait velocity interpretation: 1.31 - 2.62 ft/sec, indicative of limited community ambulator General Gait Details: pt slow and guarded, verbal cues to decrease bilat UE support and increased use of LEs, pt with noted L LE weakness, pt with decreased step height on L side mild instability  Stairs Stairs: Yes Stairs assistance: Min assist Stair Management: One rail Right;Step to pattern;Forwards(with L HHA) Number of Stairs: 3 General stair comments: minA to power up the stair and steady/control coming down  Wheelchair Mobility    Modified Rankin (Stroke Patients Only)       Balance Overall balance assessment: Mild deficits observed, not formally tested                                           Pertinent Vitals/Pain Pain Assessment: 0-10 Pain Score: 6  Pain Location: L thigh and back spasms on the R Pain Descriptors / Indicators: Cramping;Spasm Pain Intervention(s): Monitored during session    Home Living Family/patient expects to be discharged to:: Private residence Living Arrangements: Spouse/significant other Available Help at Discharge: Family;Available 24 hours/day Type of Home: House Home Access: Stairs to enter Entrance Stairs-Rails: None Entrance Stairs-Number of Steps: 1 Home Layout: Two level Home Equipment: None      Prior Function Level of Independence: Independent         Comments: pt was playing golf up until january, very active     Hand Dominance   Dominant Hand: Right    Extremity/Trunk Assessment   Upper Extremity Assessment Upper Extremity  Assessment: Overall WFL for tasks assessed    Lower Extremity Assessment Lower Extremity Assessment: LLE deficits/detail LLE Deficits / Details: grossly 3-/5, unable to bring L LE up into the bed without modA    Cervical / Trunk Assessment Cervical / Trunk Assessment:  Other exceptions Cervical / Trunk Exceptions: back surgery  Communication   Communication: No difficulties  Cognition Arousal/Alertness: Awake/alert Behavior During Therapy: WFL for tasks assessed/performed Overall Cognitive Status: Within Functional Limits for tasks assessed                                        General Comments General comments (skin integrity, edema, etc.): incisional dressing intact    Exercises     Assessment/Plan    PT Assessment Patient needs continued PT services  PT Problem List Decreased strength;Decreased activity tolerance;Decreased balance;Decreased mobility       PT Treatment Interventions DME instruction;Gait training;Stair training;Functional mobility training;Therapeutic activities    PT Goals (Current goals can be found in the Care Plan section)  Acute Rehab PT Goals Patient Stated Goal: get stronger and get some sleep PT Goal Formulation: With patient Time For Goal Achievement: 02/11/20 Potential to Achieve Goals: Good    Frequency Min 5X/week   Barriers to discharge        Co-evaluation               AM-PAC PT "6 Clicks" Mobility  Outcome Measure Help needed turning from your back to your side while in a flat bed without using bedrails?: A Little Help needed moving from lying on your back to sitting on the side of a flat bed without using bedrails?: A Little Help needed moving to and from a bed to a chair (including a wheelchair)?: A Little Help needed standing up from a chair using your arms (e.g., wheelchair or bedside chair)?: A Little Help needed to walk in hospital room?: A Little Help needed climbing 3-5 steps with a railing? : A Little 6 Click Score: 18    End of Session Equipment Utilized During Treatment: Gait belt;Back brace Activity Tolerance: Patient tolerated treatment well Patient left: (sitting EOB with OT) Nurse Communication: Mobility status(L LE pain) PT Visit Diagnosis: Muscle  weakness (generalized) (M62.81);Difficulty in walking, not elsewhere classified (R26.2)    Time: [id number] PT Time Calculation (min) (ACUTE ONLY): 30 min   Charges:   PT Evaluation $PT Eval Moderate Complexity: 1 Mod PT Treatments $Gait Training: 8-22 mins        Kittie Plater, PT, DPT Acute Rehabilitation Services Pager #: [phone] Office #: [phone]   Berline Lopes 01/28/2020, 10:02 AM

## 2020-01-28 NOTE — TOC Initial Note (Signed)
Transition of Care [facility]) - Initial/Assessment Note    Patient Details  Name: Courtney Horne MRN: [id number] Date of Birth: 08/20/1951  Transition of [facility]) CM/SW Contact:    Bethena Roys, RN Phone Number: 01/28/2020, 4:22 PM  Clinical Narrative:  Recommendations received for home health Physical Therapy- Case Manager offered choice via Medicare.gov list and patient chose [facility] for services. Referral made to Liaison and start of care to begin within 24-48 hours post transition home. Durable Medical Equipment will be delivered via [employer]. No further needs from Case Manager at this time.                  Expected Discharge Plan: [place] Barriers to Discharge: No Barriers Identified   Patient Goals and CMS Choice Patient states their goals for this hospitalization and ongoing recovery are:: "to return home" CMS Medicare.gov Compare Post Acute Care list provided to:: Patient Choice offered to / list presented to : Patient  Expected Discharge Plan and Services Expected Discharge Plan: [place] In-house Referral: NA Discharge Planning Services: CM Consult Post Acute Care Choice: Home Health, Durable Medical Equipment Living arrangements for the past 2 months: Single Family Home                 DME Arranged: Walker rolling DME Agency: [employer] Date DME Agency Contacted: 01/28/20 Time DME Agency Contacted: [phone] Representative spoke with at DME Agency: [place]: PT [place]: [place] Date [place]: 01/28/20 Time [place]: [place] Representative spoke with at [place]: [facility]  Prior Living Arrangements/Services Living arrangements for the past 2 months: [place] with:: Spouse Patient language and need for interpreter reviewed:: Yes Do you feel safe going back to the place where you live?: Yes      Need for Family Participation in Patient Care: Yes  (Comment) Care giver support system in place?: Yes (comment)   Criminal Activity/Legal Involvement Pertinent to Current Situation/Hospitalization: No - Comment as needed  Activities of Daily Living      Permission Sought/Granted Permission sought to share information with : Family Supports Permission granted to share information with : Yes, Verbal Permission Granted     Permission granted to share info w AGENCY: [facility]        Emotional Assessment Appearance:: Appears stated age Attitude/Demeanor/Rapport: Engaged Affect (typically observed): Appropriate Orientation: : Oriented to Situation, Oriented to  Time, Oriented to Self, Oriented to Place Alcohol / Substance Use: Not Applicable Psych Involvement: No (comment)  Admission diagnosis:  Spondylolisthesis of lumbar region [M43.16] Patient Active Problem List   Diagnosis Date Noted  . Spondylolisthesis of lumbar region 01/27/2020  . Osteopenia   . Fibroid   . Adenomyosis   . Uterine prolapse   . Urinary incontinence   . Cystocele   . Rectocele   . Menopausal symptoms   . Migraine   . Asthma    PCP:  Crist Infante, MD Pharmacy:   [id number] - [place], [address] [place] [address] Phone: [phone] Fax: [phone]     Social Determinants of Health (SDOH) Interventions    Readmission Risk Interventions No flowsheet data found.

## 2020-01-28 NOTE — Evaluation (Signed)
Occupational Therapy Evaluation Patient Details Name: Courtney Horne MRN: [id number] DOB: Aug 29, 1951 Today's Date: 01/28/2020    History of Present Illness Pt with L sided radicular pain. Pt underwent anterior lateral L Lumbar L3-4, L4-5 fusion. PMH: unremarkable.   Clinical Impression   PTA patient independent. Admitted for above and limited by problem list below, including back pain/L LE pain, back precaution adherence, activity tolerance.  Educated on brace mgmt and wear schedule, back precautions, ADl compensatory techniques and use of AE, safety, DME and recommendations. Patient requires min assist - supervision for ADLs using AE, min guard for transfers and mod assist for bed mobility. Plans on purchasing a hip kit for LB ADLs.  She will benefit from further OT services while admitted to optimize independence and safety with ADLs, mobility but anticipate she will progress well with no further needs after dc home.     Follow Up Recommendations  No OT follow up;Supervision - Intermittent    Equipment Recommendations  None recommended by OT(hip kit- self purchasing)    Recommendations for Other Services       Precautions / Restrictions Precautions Precautions: Back Precaution Booklet Issued: Yes (comment) Precaution Comments: pt educated with good understanding Required Braces or Orthoses: Spinal Brace Spinal Brace: Lumbar corset;Applied in sitting position Restrictions Weight Bearing Restrictions: No      Mobility Bed Mobility Overal bed mobility: Needs Assistance Bed Mobility: Rolling;Sit to Sidelying Rolling: Min guard Sidelying to sit: Min assist     Sit to sidelying: Mod assist General bed mobility comments: mod cueing for log rolling and technique, requires assist to ascend B LEs to bed due to weakness    Transfers Overall transfer level: Needs assistance Equipment used: Rolling walker (2 wheeled) Transfers: Sit to/from Stand Sit to Stand: Min guard          General transfer comment: cueing for hand placement and safety with transfers, poor carryover    Balance Overall balance assessment: Mild deficits observed, not formally tested                                         ADL either performed or assessed with clinical judgement   ADL Overall ADL's : Needs assistance/impaired     Grooming: Min guard;Standing   Upper Body Bathing: Supervision/ safety;Set up;Sitting   Lower Body Bathing: Min guard;With adaptive equipment;Sit to/from stand Lower Body Bathing Details (indicate cue type and reason): reviewed compensatory techniques and use of long sponge  Upper Body Dressing : Set up;Supervision/safety;Sitting Upper Body Dressing Details (indicate cue type and reason): min cueing for brace mgmt  Lower Body Dressing: Minimal assistance;Sit to/from stand;Cueing for compensatory techniques;With adaptive equipment Lower Body Dressing Details (indicate cue type and reason): educated on reacher, sock aide, shoe horn for LB ADLs, as unable to figure 4 technique; pt requires min guard sit to stand and cueing for technique  Toilet Transfer: Min guard;Ambulation;RW Toilet Transfer Details (indicate cue type and reason): cueing for hand placement and technique, simulated to recliner      Tub/ Shower Transfer: Walk-in shower;Min guard;Ambulation;Shower Technical sales engineer Details (indicate cue type and reason): reviewed reverse step technique, simulated shower transfer with min guard  Functional mobility during ADLs: Min guard;Rolling walker;Cueing for safety General ADL Comments: pt educated on back precautions, brace mgmt, ADl compensatory techniques, AE, recommednations and safety      Vision  Perception     Praxis      Pertinent Vitals/Pain Pain Assessment: Faces Pain Score: 6  Faces Pain Scale: Hurts little more Pain Location: L thigh and back spasms on the R Pain Descriptors / Indicators:  Cramping;Spasm Pain Intervention(s): Monitored during session;Repositioned;Limited activity within patient's tolerance     Hand Dominance Right   Extremity/Trunk Assessment Upper Extremity Assessment Upper Extremity Assessment: Overall WFL for tasks assessed   Lower Extremity Assessment Lower Extremity Assessment: Defer to PT evaluation LLE Deficits / Details: grossly 3-/5, unable to bring L LE up into the bed without modA   Cervical / Trunk Assessment Cervical / Trunk Assessment: Other exceptions Cervical / Trunk Exceptions: back surgery   Communication Communication Communication: No difficulties   Cognition Arousal/Alertness: Awake/alert Behavior During Therapy: WFL for tasks assessed/performed Overall Cognitive Status: Within Functional Limits for tasks assessed                                     General Comments  incisional dressing intact    Exercises     Shoulder Instructions      Home Living Family/patient expects to be discharged to:: Private residence Living Arrangements: Spouse/significant other Available Help at Discharge: Family;Available 24 hours/day Type of Home: House Home Access: Stairs to enter [employer] of Steps: 1 Entrance Stairs-Rails: None Home Layout: Two level Alternate Level Stairs-Number of Steps: flight Alternate Level Stairs-Rails: Left Bathroom Shower/Tub: Occupational psychologist: Handicapped height     Home Equipment: None          Prior Functioning/Environment Level of Independence: Independent        Comments: pt was playing golf up until january, very active        OT Problem List: Decreased strength;Decreased activity tolerance;Decreased knowledge of use of DME or AE;Decreased knowledge of precautions;Pain      OT Treatment/Interventions: Self-care/ADL training;DME and/or AE instruction;Therapeutic activities;Patient/family education    OT Goals(Current goals can be found in  the care plan section) Acute Rehab OT Goals Patient Stated Goal: get stronger and get some sleep OT Goal Formulation: With patient Time For Goal Achievement: 02/11/20 Potential to Achieve Goals: Good  OT Frequency: Min 2X/week   Barriers to D/C:            Co-evaluation              AM-PAC OT "6 Clicks" Daily Activity     Outcome Measure Help from another person eating meals?: None Help from another person taking care of personal grooming?: A Little Help from another person toileting, which includes using toliet, bedpan, or urinal?: A Little Help from another person bathing (including washing, rinsing, drying)?: A Little Help from another person to put on and taking off regular upper body clothing?: A Little Help from another person to put on and taking off regular lower body clothing?: A Little 6 Click Score: 19   End of Session Equipment Utilized During Treatment: Rolling walker;Back brace Nurse Communication: Mobility status  Activity Tolerance: Patient tolerated treatment well Patient left: in bed;with call bell/phone within reach  OT Visit Diagnosis: Other abnormalities of gait and mobility (R26.89);Muscle weakness (generalized) (M62.81);Pain Pain - part of body: (incisonal, L LE)                Time: 6063-0160 OT Time Calculation (min): 30 min Charges:  OT General Charges $OT Visit: 1 Visit OT Evaluation $  OT Eval Low Complexity: 1 Low OT Treatments $Self Care/Home Management : 8-22 mins  Jolaine Artist, OT Acute Rehabilitation Services Pager [phone] Office [phone]    Courtney Horne 01/28/2020, 11:08 AM

## 2020-01-29 MED ORDER — HYDROMORPHONE HCL 2 MG PO TABS: 2.0000 mg | ORAL_TABLET | ORAL | 0 refills | Status: DC | PRN

## 2020-01-29 MED ORDER — GABAPENTIN 300 MG PO CAPS: 300.0000 mg | ORAL_CAPSULE | Freq: Three times a day (TID) | ORAL | 1 refills | Status: DC

## 2020-01-29 MED ORDER — METHOCARBAMOL 500 MG PO TABS: 500.0000 mg | ORAL_TABLET | Freq: Four times a day (QID) | ORAL | 1 refills | Status: DC | PRN

## 2020-01-29 NOTE — Discharge Instructions (Signed)

## 2020-01-29 NOTE — Progress Notes (Signed)
Occupational Therapy Treatment Patient Details Name: Courtney Horne MRN: [id number] DOB: 08-27-1951 Today's Date: 01/29/2020    History of present illness Pt with L sided radicular pain. Pt underwent anterior lateral L Lumbar L3-4, L4-5 fusion. PMH: unremarkable.   OT comments  Patient progressing well, continues to limited by pain, precautions, and L LE pain/weakness.  She requires min guard to mod assist for bed mobility, cueing to hook L LE with RLE for increased ease but requires assist to bring LEs back to side lying.  MinA for LB ADLs, donning shoes using long handled shoe horn; will have support of spouse and has purchased hip kit.  Will follow acutely.    Follow Up Recommendations  No OT follow up;Supervision - Intermittent    Equipment Recommendations  None recommended by OT    Recommendations for Other Services      Precautions / Restrictions Precautions Precautions: Back Precaution Booklet Issued: Yes (comment) Precaution Comments: pt educated with good understanding Required Braces or Orthoses: Spinal Brace Spinal Brace: Lumbar corset;Applied in sitting position Restrictions Weight Bearing Restrictions: No       Mobility Bed Mobility Overal bed mobility: Needs Assistance Bed Mobility: Rolling;Sidelying to Sit;Sit to Sidelying Rolling: Supervision Sidelying to sit: Min guard     Sit to sidelying: Mod assist General bed mobility comments: patient requires cueing for log rolling technique, supervision to ensure log roll technique with min guard using hooking technique for L LE to transition to EOB and mod assist to bring LEs back to sidelying   Transfers Overall transfer level: Needs assistance Equipment used: Rolling walker (2 wheeled) Transfers: Sit to/from Stand Sit to Stand: Supervision         General transfer comment: cueing for hand placement, supervision for safety    Balance Overall balance assessment: Mild deficits observed, not formally  tested                                         ADL either performed or assessed with clinical judgement   ADL Overall ADL's : Needs assistance/impaired     Grooming: Supervision/safety;Standing               Lower Body Dressing: Minimal assistance;Sit to/from stand;Cueing for back precautions;Cueing for compensatory techniques;With adaptive equipment Lower Body Dressing Details (indicate cue type and reason): using reacher to doff socks and shoe horn to don shoes, min assist to tie and cueing for compensatory techniques (educated on elastic laces or leaving shoes tied)--pt reports purchasing hip kit  Toilet Transfer: Supervision/safety;Ambulation;RW Toilet Transfer Details (indicate cue type and reason): simulated in room       Tub/Shower Transfer Details (indicate cue type and reason): pt reports understanding with shower transfer, no concerns Functional mobility during ADLs: Rolling walker;Supervision/safety General ADL Comments: pt supine with back brace on upon entry, educated on NOT wearing brace in bed      Vision       Perception     Praxis      Cognition Arousal/Alertness: Awake/alert Behavior During Therapy: WFL for tasks assessed/performed Overall Cognitive Status: Within Functional Limits for tasks assessed                                          Exercises     Shoulder Instructions  General Comments educated on not wearing brace in bed     Pertinent Vitals/ Pain       Pain Assessment: Faces Faces Pain Scale: Hurts little more Pain Location: back incisional, L thigh Pain Descriptors / Indicators: Cramping;Discomfort;Grimacing Pain Intervention(s): Monitored during session;Repositioned  Home Living Family/patient expects to be discharged to:: Private residence Living Arrangements: Spouse/significant other                                      Prior Functioning/Environment               Frequency  Min 2X/week        Progress Toward Goals  OT Goals(current goals can now be found in the care plan section)  Progress towards OT goals: Progressing toward goals  Acute Rehab OT Goals Patient Stated Goal: home today OT Goal Formulation: With patient  Plan Discharge plan remains appropriate;Frequency remains appropriate    Co-evaluation                 AM-PAC OT "6 Clicks" Daily Activity     Outcome Measure   Help from another person eating meals?: None Help from another person taking care of personal grooming?: A Little Help from another person toileting, which includes using toliet, bedpan, or urinal?: A Little Help from another person bathing (including washing, rinsing, drying)?: A Little Help from another person to put on and taking off regular upper body clothing?: A Little Help from another person to put on and taking off regular lower body clothing?: A Little 6 Click Score: 19    End of Session Equipment Utilized During Treatment: Rolling walker;Back brace  OT Visit Diagnosis: Other abnormalities of gait and mobility (R26.89);Muscle weakness (generalized) (M62.81);Pain Pain - part of body: (incisional, L thigh)   Activity Tolerance Patient tolerated treatment well   Patient Left in bed;with call bell/phone within reach   Nurse Communication Mobility status        Time: 0102-7253 OT Time Calculation (min): 16 min  Charges: OT General Charges $OT Visit: 1 Visit OT Treatments $Self Care/Home Management : 8-22 mins  Jolaine Artist, OT [place] Pager [phone] Office [place] 01/29/2020, 9:16 AM

## 2020-01-29 NOTE — Progress Notes (Signed)
Subjective: Patient reports doing better today  Objective: Vital signs in last 24 hours: Temp:  [98.2 F (36.8 C)-100.7 F (38.2 C)] 100.7 F (38.2 C) (03/11 0807) Pulse Rate:  [74-102] 89 (03/11 0807) Resp:  [16-18] 16 (03/11 0807) BP: (106-126)/(56-76) 116/61 (03/11 0807) SpO2:  [98 %-100 %] 99 % (03/11 0807)  Intake/Output from previous day: No intake/output data recorded. Intake/Output this shift: No intake/output data recorded.  Physical Exam: Less left leg irritability and improved strength.  Normal DF/EHL, improved hip flexor.  Dressings CDI.    Lab Results: No results for input(s): WBC, HGB, HCT, PLT in the last 72 hours. BMET No results for input(s): NA, K, CL, CO2, GLUCOSE, BUN, CREATININE, CALCIUM in the last 72 hours.  Studies/Results: DG Lumbar Spine Complete  Result Date: 01/27/2020 CLINICAL DATA:  Lumbar fusion EXAM: DG C-ARM 1-60 MIN; LUMBAR SPINE - COMPLETE 4+ VIEW COMPARISON:  11/05/2019 FLUOROSCOPY TIME:  Fluoroscopy Time:  3 minutes 11 seconds Radiation Exposure Index (if provided by the fluoroscopic device): Not available Number of Acquired Spot Images: 7 FINDINGS: Interbody fusion is noted at L3-4 and L4-5. Minimal anterolisthesis is noted of L4 on L5 stable from the prior examination. IMPRESSION: Interval fusion at L3-4 and L4-5. Electronically Signed   By: Inez Catalina M.D.   On: 01/27/2020 16:29   DG C-Arm 1-60 Min  Result Date: 01/27/2020 CLINICAL DATA:  Lumbar fusion EXAM: DG C-ARM 1-60 MIN; LUMBAR SPINE - COMPLETE 4+ VIEW COMPARISON:  11/05/2019 FLUOROSCOPY TIME:  Fluoroscopy Time:  3 minutes 11 seconds Radiation Exposure Index (if provided by the fluoroscopic device): Not available Number of Acquired Spot Images: 7 FINDINGS: Interbody fusion is noted at L3-4 and L4-5. Minimal anterolisthesis is noted of L4 on L5 stable from the prior examination. IMPRESSION: Interval fusion at L3-4 and L4-5. Electronically Signed   By: Inez Catalina M.D.   On: 01/27/2020  16:29    Assessment/Plan: Patient is doing well.  Home later today.  Incentive spirometer.  Mobilize in brace.  Continue gabapentin.    LOS: 2 days    Peggyann Shoals, MD 01/29/2020, 10:25 AM

## 2020-01-29 NOTE — Progress Notes (Signed)
Physical Therapy Treatment Patient Details Name: Courtney Horne MRN: [id number] DOB: [birth year] Today's Date: 01/29/2020    History of Present Illness Pt with L sided radicular pain. Pt underwent anterior lateral L Lumbar L3-4, L4-5 fusion. PMH: unremarkable.    PT Comments    Pt with good ambulation distance and steadiness with use of RW today, requiring min verbal cuing for placement in RW to maintain back precautions and prevent leaning on RW. Pt navigated stairs with min guard for safety only today, no unsteadiness or LOB noted on stairs but pt encouraged to have husband with her during mobility. PT educated pt on the importance of frequent mobility on d/c home for DVT prevention and reducing back stiffness and discomfort, PT recommending up and ambulating 1X/hour with supervision from husband. PT reviewed log roll technique, educated caregiver on assisting pt on stairs at home, and reviewed all spinal precautions with pt for d/c home. Pt with no further acute PT needs, appropriate to d/c home with assist of husband.    Follow Up Recommendations  Home health PT;Supervision - Intermittent     Equipment Recommendations  Rolling walker with 5" wheels    Recommendations for Other Services       Precautions / Restrictions Precautions Precautions: Back Precaution Booklet Issued: Yes (comment) Precaution Comments: pt able to recall 3/3 back precautions, can don/doff brace without assist Required Braces or Orthoses: Spinal Brace Spinal Brace: Lumbar corset;Applied in sitting position Restrictions Weight Bearing Restrictions: No    Mobility  Bed Mobility Overal bed mobility: Needs Assistance Bed Mobility: Rolling;Sidelying to Sit;Sit to Sidelying Rolling: Supervision Sidelying to sit: Min guard     Sit to sidelying: Mod assist General bed mobility comments: pt up in chair upon PT arrival, requesting stay in chair upon PT exit and politely declines bed mobility. Pt states log  rolling is "hard", but explains technique correctly to PT and states husband will provide assist at home.  Transfers Overall transfer level: Needs assistance Equipment used: None Transfers: Sit to/from Stand Sit to Stand: Supervision         General transfer comment: supervision for safety, increased time to rise and steady.  Ambulation/Gait Ambulation/Gait assistance: Min guard Gait Distance (Feet): 150 Feet Assistive device: Rolling walker (2 wheeled) Gait Pattern/deviations: Step-through pattern;Decreased stride length;Trunk flexed;Trendelenburg Gait velocity: decr   General Gait Details: Slow and steady gait with use of RW, min gaurd for safety. + L trendelenburg gait, decreased stance time RLE due to LLE weakness. Verbal cuing for placement in RW.   Stairs Stairs: Yes Stairs assistance: Min guard Stair Management: One rail Right;Step to pattern;Forwards Number of Stairs: 5 General stair comments: min guard for safety, pt verbalizes "up with the good, down with the bad" sequencing rule. PT cued pt that husband shoulder be posterior to her when ascending steps and anterolateral to her with descending for pt safety.   Wheelchair Mobility    Modified Rankin (Stroke Patients Only)       Balance Overall balance assessment: Mild deficits observed, not formally tested                                          Cognition Arousal/Alertness: Awake/alert Behavior During Therapy: WFL for tasks assessed/performed Overall Cognitive Status: Within Functional Limits for tasks assessed  General Comments: pt states "my head still feels fuzzy because of anesthesia"      Exercises      General Comments General comments (skin integrity, edema, etc.): educated on not wearing brace in bed       Pertinent Vitals/Pain Pain Assessment: 0-10 Pain Score: 8  Faces Pain Scale: Hurts little more Pain Location: back  incisional, L thigh Pain Descriptors / Indicators: Discomfort;Grimacing Pain Intervention(s): Limited activity within patient's tolerance;Monitored during session;Repositioned    Home Living Family/patient expects to be discharged to:: Private residence Living Arrangements: Spouse/significant other                  Prior Function            PT Goals (current goals can now be found in the care plan section) Acute Rehab PT Goals Patient Stated Goal: home today PT Goal Formulation: With patient Time For Goal Achievement: 02/11/20 Potential to Achieve Goals: Good Progress towards PT goals: Progressing toward goals    Frequency    Min 5X/week      PT Plan Current plan remains appropriate    Co-evaluation              AM-PAC PT "6 Clicks" Mobility   Outcome Measure  Help needed turning from your back to your side while in a flat bed without using bedrails?: A Little Help needed moving from lying on your back to sitting on the side of a flat bed without using bedrails?: A Little Help needed moving to and from a bed to a chair (including a wheelchair)?: A Little Help needed standing up from a chair using your arms (e.g., wheelchair or bedside chair)?: A Little Help needed to walk in hospital room?: A Little Help needed climbing 3-5 steps with a railing? : A Little 6 Click Score: 18    End of Session Equipment Utilized During Treatment: Gait belt;Back brace Activity Tolerance: Patient tolerated treatment well Patient left: in chair;with family/visitor present Nurse Communication: Mobility status(L LE pain) PT Visit Diagnosis: Muscle weakness (generalized) (M62.81);Difficulty in walking, not elsewhere classified (R26.2)     Time: [id number] PT Time Calculation (min) (ACUTE ONLY): 23 min  Charges:  $Gait Training: 8-22 mins $Self Care/Home Management: 8-22                    Marisa Cyphers, PT Acute Rehabilitation Services Pager [phone]  Office  [phone]   Jolly Bleicher D [place] 01/29/2020, 11:45 AM

## 2020-01-29 NOTE — Discharge Summary (Signed)
Physician Discharge Summary  Patient ID: Courtney Horne MRN: [id number] DOB/AGE: May 19, 1951 69 y.o.  Admit date: 01/27/2020 Discharge date: 01/29/2020  Admission Diagnoses: Degenerative lumbar spinal stenosis, spondylolisthesis, lumbago, radiculopathy L 34 and L 45 levels, with sagittal imbalance     Discharge Diagnoses: Degenerative lumbar spinal stenosis, spondylolisthesis, lumbago, radiculopathy L 34 and L 45 levels, with sagittal imbalance s/p Left Lumbar three-four Lumbar four-five Anterolateral lumbar interbody fusion (Left) Percutaneous Pedicle Screw Fixation Lumbar three-Lumbar five (N/A)       Active Problems:   Spondylolisthesis of lumbar region   Discharged Condition: good  Hospital Course: Andretta Walloch was admitted for surgery with dx stenosis, spondylolisthesis and radiculopathy. Following uncomplicated surgery (above), she recovered nicely and transferred to [facility] for nursing care and therapies. She is mobilizing well with some (improving) thigh discomfort.   Consults: None  Significant Diagnostic Studies: radiology: X-Ray: intra-op  Treatments: surgery: Left Lumbar three-four Lumbar four-five Anterolateral lumbar interbody fusion (Left) Percutaneous Pedicle Screw Fixation Lumbar three-Lumbar five (N/A)     Discharge Exam: Blood pressure 116/61, pulse 89, temperature (!) 100.7 F (38.2 C), temperature source Oral, resp. rate 16, height 5\' 3"  (1.6 m), weight 68 kg, SpO2 99 %. Less left leg irritability and improved strength.  Normal DF/EHL, improved hip flexor.  Dressings CDI.       Disposition: Discharge disposition: 01-Home or Self Care Pt will continue incentive spirometer at home. Rx's for Gabapentin and Dilaudid to her pharmacy for prn use. Office f/u in 3-4 weeks.       Discharge Instructions    Diet - low sodium heart healthy   Complete by: As directed    Increase activity slowly   Complete by: As directed      Allergies as of 01/29/2020       Reactions   Penicillins Itching, Rash   Did it involve swelling of the face/tongue/throat, SOB, or low BP? No Did it involve sudden or severe rash/hives, skin peeling, or any reaction on the inside of your mouth or nose? No Did you need to seek medical attention at a hospital or doctor's office? No When did it last happen?15 years ago If all above answers are "NO", may proceed with cephalosporin use.   Zocor [simvastatin] Other (See Comments)   UNSPECIFIED REACTION    Codeine Nausea And Vomiting   Tramadol Hcl Itching      Medication List    TAKE these medications   amitriptyline 50 MG tablet Commonly known as: ELAVIL Take 2 tablets (100 mg total) by mouth at bedtime.   Biotin 5000 MCG Tabs Take 5,000 mcg by mouth daily.   Ergocalciferol 50 MCG (2000 UT) Caps Take 3,000 Units by mouth daily. What changed: how much to take   estradiol 0.0375 MG/24HR Commonly known as: Wayne 1 patch onto the skin 2 (two) times a week. Apply anywhere on lower abdomen twice weekly.   fexofenadine 180 MG tablet Commonly known as: ALLEGRA Take 180 mg by mouth daily.   gabapentin 300 MG capsule Commonly known as: NEURONTIN Take 1 capsule (300 mg total) by mouth 3 (three) times daily.   GenTeal 0.25-0.3 % Gel Generic drug: Carboxymethylcell-Hypromellose Place 1 drop into both eyes daily.   HYDROmorphone 2 MG tablet Commonly known as: DILAUDID Take 1 tablet (2 mg total) by mouth every 3 (three) hours as needed for severe pain.   methocarbamol 500 MG tablet Commonly known as: ROBAXIN Take 1 tablet (500 mg total) by mouth every 6 (six) hours  as needed for muscle spasms.   multivitamin with minerals Tabs tablet Take 1 tablet by mouth daily.   omeprazole 10 MG capsule Commonly known as: PRILOSEC Take 10 mg by mouth 2 (two) times daily.   ondansetron 4 MG tablet Commonly known as: ZOFRAN Take 4-8 mg by mouth every 8 (eight) hours as needed for nausea/vomiting.    PROBIOTIC DAILY PO Take 1 tablet by mouth daily.   rosuvastatin 40 MG tablet Commonly known as: CRESTOR Take 1 tablet (40 mg total) by mouth daily.   SUMAtriptan 100 MG tablet Commonly known as: IMITREX Take 1 tablet (100 mg total) by mouth every 2 (two) hours as needed for migraine. May repeat in 2 hours if headache persists or recurs.   Symbicort 160-4.5 MCG/ACT inhaler Generic drug: budesonide-formoterol Inhale 2 puffs into the lungs every morning.            Durable Medical Equipment  (From admission, onward)         Start     Ordered   01/28/20 1601  For home use only DME Walker  Once    Question:  Patient needs a walker to treat with the following condition  Answer:  S/P lumbar spinal fusion   01/28/20 1600   01/28/20 1600  For home use only DME Walker  Once    Question:  Patient needs a walker to treat with the following condition  Answer:  S/P lumbar spinal fusion   01/28/20 1600         Follow-up Information    [facility] Follow up.   Why: Rolling Walker- to be delivered to the room prior to transition home.  Contact information: [place] [address] [phone]        [facility] Follow up.   Specialty: Home Health Services Why: Physical Therapy-office to call with appointment time.  Contact information: [address] [place] [address] [phone]           Signed: Verdis Prime 01/29/2020, 10:26 AM  Patient is doing well.  Discharge home.

## 2020-01-29 NOTE — Progress Notes (Signed)
Patient is discharged from room 3C05 at this time. Alert and in stable condition. IV site d/c'd and instructions read to patient and spouse with understanding verbalized. Left unit via wheelchair with all belongings at side. 

## 2020-01-30 DIAGNOSIS — Z4789 Encounter for other orthopedic aftercare: Secondary | ICD-10-CM | POA: Diagnosis not present

## 2020-01-30 DIAGNOSIS — M4316 Spondylolisthesis, lumbar region: Secondary | ICD-10-CM | POA: Diagnosis not present

## 2020-01-30 DIAGNOSIS — M5416 Radiculopathy, lumbar region: Secondary | ICD-10-CM | POA: Diagnosis not present

## 2020-01-30 DIAGNOSIS — M8580 Other specified disorders of bone density and structure, unspecified site: Secondary | ICD-10-CM | POA: Diagnosis not present

## 2020-01-30 DIAGNOSIS — M5440 Lumbago with sciatica, unspecified side: Secondary | ICD-10-CM | POA: Diagnosis not present

## 2020-01-30 DIAGNOSIS — J45909 Unspecified asthma, uncomplicated: Secondary | ICD-10-CM | POA: Diagnosis not present

## 2020-01-30 DIAGNOSIS — N814 Uterovaginal prolapse, unspecified: Secondary | ICD-10-CM | POA: Diagnosis not present

## 2020-01-30 DIAGNOSIS — K5900 Constipation, unspecified: Secondary | ICD-10-CM | POA: Diagnosis not present

## 2020-01-30 DIAGNOSIS — Z7951 Long term (current) use of inhaled steroids: Secondary | ICD-10-CM | POA: Diagnosis not present

## 2020-01-30 DIAGNOSIS — G43909 Migraine, unspecified, not intractable, without status migrainosus: Secondary | ICD-10-CM | POA: Diagnosis not present

## 2020-01-30 DIAGNOSIS — M48061 Spinal stenosis, lumbar region without neurogenic claudication: Secondary | ICD-10-CM | POA: Diagnosis not present

## 2020-01-30 DIAGNOSIS — Z79891 Long term (current) use of opiate analgesic: Secondary | ICD-10-CM | POA: Diagnosis not present

## 2020-01-30 DIAGNOSIS — D219 Benign neoplasm of connective and other soft tissue, unspecified: Secondary | ICD-10-CM | POA: Diagnosis not present

## 2020-01-30 DIAGNOSIS — N800 Endometriosis of uterus: Secondary | ICD-10-CM | POA: Diagnosis not present

## 2020-01-30 DIAGNOSIS — Z981 Arthrodesis status: Secondary | ICD-10-CM | POA: Diagnosis not present

## 2020-01-30 DIAGNOSIS — M79605 Pain in left leg: Secondary | ICD-10-CM | POA: Diagnosis not present

## 2020-01-30 DIAGNOSIS — Z9181 History of falling: Secondary | ICD-10-CM | POA: Diagnosis not present

## 2020-02-06 DIAGNOSIS — Z7951 Long term (current) use of inhaled steroids: Secondary | ICD-10-CM | POA: Diagnosis not present

## 2020-02-06 DIAGNOSIS — M8580 Other specified disorders of bone density and structure, unspecified site: Secondary | ICD-10-CM | POA: Diagnosis not present

## 2020-02-06 DIAGNOSIS — N814 Uterovaginal prolapse, unspecified: Secondary | ICD-10-CM | POA: Diagnosis not present

## 2020-02-06 DIAGNOSIS — M5416 Radiculopathy, lumbar region: Secondary | ICD-10-CM | POA: Diagnosis not present

## 2020-02-06 DIAGNOSIS — G43909 Migraine, unspecified, not intractable, without status migrainosus: Secondary | ICD-10-CM | POA: Diagnosis not present

## 2020-02-06 DIAGNOSIS — Z79891 Long term (current) use of opiate analgesic: Secondary | ICD-10-CM | POA: Diagnosis not present

## 2020-02-06 DIAGNOSIS — J45909 Unspecified asthma, uncomplicated: Secondary | ICD-10-CM | POA: Diagnosis not present

## 2020-02-06 DIAGNOSIS — Z9181 History of falling: Secondary | ICD-10-CM | POA: Diagnosis not present

## 2020-02-06 DIAGNOSIS — M4316 Spondylolisthesis, lumbar region: Secondary | ICD-10-CM | POA: Diagnosis not present

## 2020-02-06 DIAGNOSIS — N800 Endometriosis of uterus: Secondary | ICD-10-CM | POA: Diagnosis not present

## 2020-02-06 DIAGNOSIS — Z981 Arthrodesis status: Secondary | ICD-10-CM | POA: Diagnosis not present

## 2020-02-06 DIAGNOSIS — M5440 Lumbago with sciatica, unspecified side: Secondary | ICD-10-CM | POA: Diagnosis not present

## 2020-02-06 DIAGNOSIS — M79605 Pain in left leg: Secondary | ICD-10-CM | POA: Diagnosis not present

## 2020-02-06 DIAGNOSIS — Z4789 Encounter for other orthopedic aftercare: Secondary | ICD-10-CM | POA: Diagnosis not present

## 2020-02-06 DIAGNOSIS — M48061 Spinal stenosis, lumbar region without neurogenic claudication: Secondary | ICD-10-CM | POA: Diagnosis not present

## 2020-02-06 DIAGNOSIS — K5900 Constipation, unspecified: Secondary | ICD-10-CM | POA: Diagnosis not present

## 2020-02-06 DIAGNOSIS — D219 Benign neoplasm of connective and other soft tissue, unspecified: Secondary | ICD-10-CM | POA: Diagnosis not present

## 2020-02-10 ENCOUNTER — Encounter: Payer: Self-pay | Admitting: Certified Nurse Midwife

## 2020-02-16 DIAGNOSIS — M5416 Radiculopathy, lumbar region: Secondary | ICD-10-CM | POA: Diagnosis not present

## 2020-03-29 DIAGNOSIS — M545 Low back pain: Secondary | ICD-10-CM | POA: Diagnosis not present

## 2020-05-03 DIAGNOSIS — M5416 Radiculopathy, lumbar region: Secondary | ICD-10-CM | POA: Diagnosis not present

## 2020-05-03 DIAGNOSIS — M4316 Spondylolisthesis, lumbar region: Secondary | ICD-10-CM | POA: Diagnosis not present

## 2020-05-03 DIAGNOSIS — M47816 Spondylosis without myelopathy or radiculopathy, lumbar region: Secondary | ICD-10-CM | POA: Diagnosis not present

## 2020-05-03 DIAGNOSIS — M48061 Spinal stenosis, lumbar region without neurogenic claudication: Secondary | ICD-10-CM | POA: Diagnosis not present

## 2020-06-03 ENCOUNTER — Other Ambulatory Visit: Payer: Self-pay | Admitting: Adult Health

## 2020-06-16 ENCOUNTER — Telehealth: Payer: Self-pay | Admitting: Internal Medicine

## 2020-06-16 DIAGNOSIS — M8580 Other specified disorders of bone density and structure, unspecified site: Secondary | ICD-10-CM

## 2020-06-16 DIAGNOSIS — E785 Hyperlipidemia, unspecified: Secondary | ICD-10-CM

## 2020-06-16 DIAGNOSIS — R7989 Other specified abnormal findings of blood chemistry: Secondary | ICD-10-CM

## 2020-06-16 NOTE — Telephone Encounter (Signed)
Patient called and asked that Dr. Harrington Challenger order a full workup of labs to be done in November. She was originally scheduled to have labs done with Dr. Joylene Draft but she feels Dr. Harrington Challenger does a more thorough set of labs and she only wants to get stuck once.

## 2020-06-22 ENCOUNTER — Ambulatory Visit: Payer: PPO | Admitting: Dermatology

## 2020-06-22 NOTE — Telephone Encounter (Signed)
Lab orders entered per Dr. Harrington Challenger.

## 2020-06-28 DIAGNOSIS — M48061 Spinal stenosis, lumbar region without neurogenic claudication: Secondary | ICD-10-CM | POA: Diagnosis not present

## 2020-06-28 DIAGNOSIS — M5416 Radiculopathy, lumbar region: Secondary | ICD-10-CM | POA: Diagnosis not present

## 2020-06-28 DIAGNOSIS — M47816 Spondylosis without myelopathy or radiculopathy, lumbar region: Secondary | ICD-10-CM | POA: Diagnosis not present

## 2020-06-28 DIAGNOSIS — M4316 Spondylolisthesis, lumbar region: Secondary | ICD-10-CM | POA: Diagnosis not present

## 2020-07-21 ENCOUNTER — Ambulatory Visit: Payer: PPO | Admitting: Dermatology

## 2020-07-21 ENCOUNTER — Encounter: Payer: Self-pay | Admitting: Dermatology

## 2020-07-21 ENCOUNTER — Other Ambulatory Visit: Payer: Self-pay

## 2020-07-21 DIAGNOSIS — L821 Other seborrheic keratosis: Secondary | ICD-10-CM

## 2020-07-21 DIAGNOSIS — D1801 Hemangioma of skin and subcutaneous tissue: Secondary | ICD-10-CM | POA: Diagnosis not present

## 2020-07-21 DIAGNOSIS — Z1283 Encounter for screening for malignant neoplasm of skin: Secondary | ICD-10-CM | POA: Diagnosis not present

## 2020-07-21 DIAGNOSIS — L905 Scar conditions and fibrosis of skin: Secondary | ICD-10-CM | POA: Diagnosis not present

## 2020-07-21 NOTE — Patient Instructions (Addendum)
Routine follow-up for Courtney Horne date of birth 04-22-1951 prompted by new growth below the right breast area.  Examination showed a 34mm brown keratotic papule typical of a benign keratosis.  There is a smaller keratosis on the left lower eyelid.  On the left cheek there is both a small solar lentigo and a flat keratosis.  All of these are clinically benign and if stable do not require removal.  There are several red angiomas on the chest and abdomen.  On the back there are scars from recent surgery but no atypical moles or skin cancer.  Courtney Horne may decide to have the eyelid lesion removed in the winter; she will schedule a half surgical visit to accomplish this.

## 2020-08-02 DIAGNOSIS — M5416 Radiculopathy, lumbar region: Secondary | ICD-10-CM | POA: Diagnosis not present

## 2020-08-02 DIAGNOSIS — M4316 Spondylolisthesis, lumbar region: Secondary | ICD-10-CM | POA: Diagnosis not present

## 2020-08-02 DIAGNOSIS — M47816 Spondylosis without myelopathy or radiculopathy, lumbar region: Secondary | ICD-10-CM | POA: Diagnosis not present

## 2020-08-02 DIAGNOSIS — M48061 Spinal stenosis, lumbar region without neurogenic claudication: Secondary | ICD-10-CM | POA: Diagnosis not present

## 2020-08-05 DIAGNOSIS — J3089 Other allergic rhinitis: Secondary | ICD-10-CM | POA: Diagnosis not present

## 2020-08-05 DIAGNOSIS — J3081 Allergic rhinitis due to animal (cat) (dog) hair and dander: Secondary | ICD-10-CM | POA: Diagnosis not present

## 2020-08-05 DIAGNOSIS — J4520 Mild intermittent asthma, uncomplicated: Secondary | ICD-10-CM | POA: Diagnosis not present

## 2020-08-05 DIAGNOSIS — J301 Allergic rhinitis due to pollen: Secondary | ICD-10-CM | POA: Diagnosis not present

## 2020-08-15 ENCOUNTER — Encounter: Payer: Self-pay | Admitting: Dermatology

## 2020-08-15 NOTE — Progress Notes (Signed)
° °  New Patient   Subjective  Courtney Horne is a 69 y.o. female who presents for the following: Skin Problem (spot under right breast (noticed for about 3 months)--spot under the left eye--increased in size (noticed for a couple months) ).  New growth Location: Right breast Duration: 3 months Quality:  Associated Signs/Symptoms: Modifying Factors:  Severity:  Timing: Context:    The following portions of the chart were reviewed this encounter and updated as appropriate: Tobacco   Allergies   Meds   Problems   Med Hx   Surg Hx   Fam Hx       Objective  Well appearing patient in no apparent distress; mood and affect are within normal limits.  All skin waist up examined.   Assessment & Plan  Screening for malignant neoplasm of skin Mid Back  Skin self examination with spouse twice annually.  Seborrheic keratosis (2) Right Breast; Right Malar Cheek  May leave if stable Routine follow-up for Courtney Horne date of birth 01/08/1951 prompted by new growth below the right breast area.  Examination showed a 45mm brown keratotic papule typical of a benign keratosis.  There is a smaller keratosis on the left lower eyelid.  On the left cheek there is both a small solar lentigo and a flat keratosis.  All of these are clinically benign and if stable do not require removal.  There are several red angiomas on the chest and abdomen.  On the back there are scars from recent surgery but no atypical moles or skin cancer.  [name] may decide to have the eyelid lesion removed in the winter; she will schedule a half surgical visit to accomplish this.

## 2020-08-27 ENCOUNTER — Other Ambulatory Visit: Payer: Self-pay | Admitting: Obstetrics and Gynecology

## 2020-09-09 DIAGNOSIS — M25512 Pain in left shoulder: Secondary | ICD-10-CM | POA: Diagnosis not present

## 2020-09-09 DIAGNOSIS — M7501 Adhesive capsulitis of right shoulder: Secondary | ICD-10-CM | POA: Diagnosis not present

## 2020-09-09 DIAGNOSIS — M25511 Pain in right shoulder: Secondary | ICD-10-CM | POA: Diagnosis not present

## 2020-09-22 ENCOUNTER — Other Ambulatory Visit: Payer: Self-pay | Admitting: Obstetrics and Gynecology

## 2020-09-22 DIAGNOSIS — Z01419 Encounter for gynecological examination (general) (routine) without abnormal findings: Secondary | ICD-10-CM

## 2020-09-22 NOTE — Telephone Encounter (Signed)
Medication refill request: Estradiol 0.0375 Last AEX:  09/08/19 Next AEX: 10/29/20 Last MMG (if hormonal medication request): 12/10/19  Neg  Refill authorized: 24/0

## 2020-09-29 ENCOUNTER — Other Ambulatory Visit: Payer: PPO

## 2020-09-29 DIAGNOSIS — E785 Hyperlipidemia, unspecified: Secondary | ICD-10-CM | POA: Diagnosis not present

## 2020-10-04 DIAGNOSIS — M2550 Pain in unspecified joint: Secondary | ICD-10-CM | POA: Diagnosis not present

## 2020-10-04 DIAGNOSIS — M791 Myalgia, unspecified site: Secondary | ICD-10-CM | POA: Diagnosis not present

## 2020-10-04 DIAGNOSIS — K219 Gastro-esophageal reflux disease without esophagitis: Secondary | ICD-10-CM | POA: Diagnosis not present

## 2020-10-04 DIAGNOSIS — E785 Hyperlipidemia, unspecified: Secondary | ICD-10-CM | POA: Diagnosis not present

## 2020-10-04 DIAGNOSIS — Z0000 Encounter for general adult medical examination without abnormal findings: Secondary | ICD-10-CM | POA: Diagnosis not present

## 2020-10-04 DIAGNOSIS — J45909 Unspecified asthma, uncomplicated: Secondary | ICD-10-CM | POA: Diagnosis not present

## 2020-10-04 DIAGNOSIS — J309 Allergic rhinitis, unspecified: Secondary | ICD-10-CM | POA: Diagnosis not present

## 2020-10-04 DIAGNOSIS — Z780 Asymptomatic menopausal state: Secondary | ICD-10-CM | POA: Diagnosis not present

## 2020-10-04 DIAGNOSIS — G4700 Insomnia, unspecified: Secondary | ICD-10-CM | POA: Diagnosis not present

## 2020-10-04 DIAGNOSIS — G43909 Migraine, unspecified, not intractable, without status migrainosus: Secondary | ICD-10-CM | POA: Diagnosis not present

## 2020-10-06 ENCOUNTER — Other Ambulatory Visit: Payer: Self-pay

## 2020-10-06 ENCOUNTER — Other Ambulatory Visit: Payer: PPO | Admitting: *Deleted

## 2020-10-06 DIAGNOSIS — M8580 Other specified disorders of bone density and structure, unspecified site: Secondary | ICD-10-CM | POA: Diagnosis not present

## 2020-10-06 DIAGNOSIS — R7989 Other specified abnormal findings of blood chemistry: Secondary | ICD-10-CM

## 2020-10-06 DIAGNOSIS — E785 Hyperlipidemia, unspecified: Secondary | ICD-10-CM

## 2020-10-08 LAB — CBC
Hematocrit: 39.4 % (ref 34.0–46.6)
Hemoglobin: 12.8 g/dL (ref 11.1–15.9)
MCH: 28 pg (ref 26.6–33.0)
MCHC: 32.5 g/dL (ref 31.5–35.7)
MCV: 86 fL (ref 79–97)
Platelets: 273 10*3/uL (ref 150–450)
RBC: 4.57 x10E6/uL (ref 3.77–5.28)
RDW: 14.1 % (ref 11.7–15.4)
WBC: 9.4 10*3/uL (ref 3.4–10.8)

## 2020-10-08 LAB — BASIC METABOLIC PANEL
BUN/Creatinine Ratio: 20 (ref 12–28)
BUN: 18 mg/dL (ref 8–27)
CO2: 24 mmol/L (ref 20–29)
Calcium: 9.7 mg/dL (ref 8.7–10.3)
Chloride: 104 mmol/L (ref 96–106)
Creatinine, Ser: 0.9 mg/dL (ref 0.57–1.00)
GFR calc Af Amer: 74 mL/min/{1.73_m2} (ref >59)
GFR calc non Af Amer: 65 mL/min/{1.73_m2} (ref >59)
Glucose: 84 mg/dL (ref 65–99)
Potassium: 4 mmol/L (ref 3.5–5.2)
Sodium: 141 mmol/L (ref 134–144)

## 2020-10-08 LAB — NMR, LIPOPROFILE
Cholesterol, Total: 146 mg/dL (ref 100–199)
HDL Particle Number: 39.5 umol/L (ref >=30.5)
HDL-C: 74 mg/dL (ref >39)
LDL Particle Number: 436 nmol/L (ref <1000)
LDL Size: 21 nm (ref >20.5)
LDL-C (NIH Calc): 61 mg/dL (ref 0–99)
LP-IR Score: <25 (ref <=45)
Small LDL Particle Number: <90 nmol/L (ref <=527)
Triglycerides: 52 mg/dL (ref 0–149)

## 2020-10-08 LAB — APOLIPOPROTEIN B: Apolipoprotein B: 63 mg/dL (ref <90)

## 2020-10-08 LAB — VITAMIN D 25 HYDROXY (VIT D DEFICIENCY, FRACTURES): Vit D, 25-Hydroxy: 47 ng/mL (ref 30.0–100.0)

## 2020-10-08 LAB — HEPATIC FUNCTION PANEL
ALT: 41 IU/L — ABNORMAL HIGH (ref 0–32)
AST: 28 IU/L (ref 0–40)
Albumin: 4.4 g/dL (ref 3.7–4.7)
Alkaline Phosphatase: 77 IU/L (ref 44–121)
Bilirubin Total: 0.5 mg/dL (ref 0.0–1.2)
Bilirubin, Direct: 0.17 mg/dL (ref 0.00–0.40)
Total Protein: 6.7 g/dL (ref 6.0–8.5)

## 2020-10-08 LAB — LIPOPROTEIN A (LPA): Lipoprotein (a): 14.4 nmol/L (ref <75.0)

## 2020-10-08 LAB — TSH: TSH: 3.53 u[IU]/mL (ref 0.450–4.500)

## 2020-10-08 LAB — HEMOGLOBIN A1C
Est. average glucose Bld gHb Est-mCnc: 128 mg/dL
Hgb A1c MFr Bld: 6.1 % — ABNORMAL HIGH (ref 4.8–5.6)

## 2020-10-16 ENCOUNTER — Other Ambulatory Visit: Payer: Self-pay | Admitting: Internal Medicine

## 2020-10-21 ENCOUNTER — Other Ambulatory Visit: Payer: Self-pay

## 2020-10-21 ENCOUNTER — Ambulatory Visit (INDEPENDENT_AMBULATORY_CARE_PROVIDER_SITE_OTHER): Payer: PPO | Admitting: Dermatology

## 2020-10-21 ENCOUNTER — Encounter: Payer: Self-pay | Admitting: Dermatology

## 2020-10-21 DIAGNOSIS — L821 Other seborrheic keratosis: Secondary | ICD-10-CM | POA: Diagnosis not present

## 2020-10-21 DIAGNOSIS — D485 Neoplasm of uncertain behavior of skin: Secondary | ICD-10-CM

## 2020-10-21 NOTE — Patient Instructions (Signed)

## 2020-10-21 NOTE — Progress Notes (Signed)
Cardiology Office Note   Date:  10/22/2020   ID:  Courtney Horne, DOB 08/27/1951, MRN [id number]  PCP:  Crist Infante, MD  Cardiologist:   Dorris Carnes, MD    F/U of HL     History of Present Illness: Courtney Horne is a 69 y.o. female with a history of HL, mild elevation of LFTs  Myovue in 2010 was normal  LVEF normal     CT for  calcium score done in 2019.  This showed a score of 18 which was 55th percentile for age and sex.  Calcium was noted in the distal left main and mid LAD.  Plan was to continue on medical therapy since  she was asymptomatic.  I saw the patient in clinic 1 year ago.  From a cardiac standpoint she has done well.  She denies chest pain.  Breathing is good.  In September the patient got the Covid booster.  After that she got her flu vaccine.  It was after the second vaccine that she had a hyper reaction in her right arm and then developed bilateral shoulder pain.  This is continued to be a problem.  She has used a backpack some which may have exacerbated.  She was on steroids at that time, backed off then put herself back on steroids.  She has an appointment plan for rheumatology.  She has been to orthopedics already  And told x-rays look good. Current Meds  Medication Sig  . amitriptyline (ELAVIL) 50 MG tablet Take 2 tablets (100 mg total) by mouth at bedtime.  . Biotin 5000 MCG TABS Take 5,000 mcg by mouth daily.   . budesonide-formoterol (SYMBICORT) 160-4.5 MCG/ACT inhaler Inhale 2 puffs into the lungs every morning.   . Carboxymethylcell-Hypromellose (GENTEAL) 0.25-0.3 % GEL Place 1 drop into both eyes daily.  . Ergocalciferol 2000 units CAPS Take 3,000 Units by mouth daily. (Patient taking differently: Take 4,000 Units by mouth daily. )  . estradiol (VIVELLE-DOT) 0.0375 MG/24HR PLACE 1 PATCH ONTO THE SKIN 2 (TWO) TIMES A WEEK. APPLY ANYWHERE ON LOWER ABDOMEN TWICE WEEKLY.  . fexofenadine (ALLEGRA) 180 MG tablet Take 180 mg by mouth daily.  . Multiple  Vitamin (MULTIVITAMIN WITH MINERALS) TABS tablet Take 1 tablet by mouth daily.  Marland Kitchen omeprazole (PRILOSEC) 10 MG capsule Take 10 mg by mouth daily.   . predniSONE (DELTASONE) 10 MG tablet Take 10 mg by mouth daily with breakfast.  . Probiotic Product (PROBIOTIC DAILY PO) Take 1 tablet by mouth daily.  . rosuvastatin (CRESTOR) 40 MG tablet Take 1 tablet (40 mg total) by mouth daily. Please keep upcoming appt in December with Dr. Harrington Challenger for future refills. Thank you  . SUMAtriptan (IMITREX) 100 MG tablet TAKE 1 TABLET AS NEEDED FOR MIGRAINE, MAY REPEAT IN 2 HOURS IF HEADACHE PERSISTS OR RECURS     Allergies:   Hydrocodone-acetaminophen, Penicillins, Zocor [simvastatin], Codeine, and Tramadol hcl   Past Medical History:  Diagnosis Date  . Adenomyosis   . Asthma    "really well controlled"  . Asthma   . Cystocele   . Fibroid   . High cholesterol   . Menopausal symptoms   . Migraine   . Migraine   . Osteopenia 2017   Resolved.  BMD normal in 2017.  Marland Kitchen PONV (postoperative nausea and vomiting)   . Rectocele   . Urinary incontinence   . Uterine prolapse     Past Surgical History:  Procedure Laterality Date  . ANTERIOR LAT LUMBAR  FUSION Left 01/27/2020   Procedure: Left Lumbar three-four Lumbar four-five Anterolateral lumbar interbody fusion;  Surgeon: Erline Levine, MD;  Location: [facility];  Service: Neurosurgery;  Laterality: Left;  . APPENDECTOMY    . BACK SURGERY  1999   L4-L5  . BLADDER SUSPENSION    . CATARACT EXTRACTION, BILATERAL  07/2018  . COLONOSCOPY  10/2011  . LUMBAR PERCUTANEOUS PEDICLE SCREW 2 LEVEL N/A 01/27/2020   Procedure: Percutaneous Pedicle Screw Fixation Lumbar three-Lumbar five;  Surgeon: Erline Levine, MD;  Location: [facility];  Service: Neurosurgery;  Laterality: N/A;  . NASAL SINUS SURGERY    . TONSILLECTOMY    . VAGINAL HYSTERECTOMY  2011   Vag. Hyst BSO A/P repair-Sling     Social History:  The patient  reports that she quit smoking about 48 years ago. She has  never used smokeless tobacco. She reports current alcohol use of about 2.0 standard drinks of alcohol per week. She reports that she does not use drugs.   Family History:  The patient's family history includes Cancer in her mother; Diabetes in her mother; Healthy in her child, child, sister, and sister; Heart disease in her father; Leukemia in her brother and mother; Migraines in her father; Retinitis pigmentosa in her father.    ROS:  Please see the history of present illness. All other systems are reviewed and  Negative to the above problem except as noted.    PHYSICAL EXAM: VS:  BP 136/78   Pulse 86   Ht 5\' 3"  (1.6 m)   Wt 146 lb (66.2 kg)   SpO2 97%   BMI 25.86 kg/m   GEN: Well nourished, well developed, in no acute distress  HEENT: normal  Neck: JVP not elevated NO, carotid bruits,  Cardiac: RRR; no murmurs;,no lower extremity edema  Respiratory:  clear to auscultation bilaterally,  GI: soft, nontender, nondistended, + BS  No hepatomegaly  MS: no deformity Moving all extremities patient with obvious discomfort when she moves her upper arms to get her vest on Skin: warm and dry, no rash Neuro:  Strength and sensation are intact Psych: euthymic mood, full affect   EKG:  EKG is ordered today.  Sinus rhythm 86 bpm nonspecific ST changes  Lipid Panel    Component Value Date/Time   CHOL 160 05/11/2017 0742   CHOL 184 01/23/2017 0925   TRIG 65 05/11/2017 0742   HDL 73 05/11/2017 0742   CHOLHDL 3.1 01/23/2017 0925   CHOLHDL 2.7 01/10/2016 0934   VLDL 19 01/10/2016 0934   LDLCALC 104 (H) 01/23/2017 0925      Wt Readings from Last 3 Encounters:  10/22/20 146 lb (66.2 kg)  01/27/20 150 lb (68 kg)  01/23/20 154 lb 3.2 oz (69.9 kg)      ASSESSMENT AND PLAN:  1  HL lipid panel (lipomed) is excellent.  Particle number is low.  LDL HDL and triglycerides are all in excellent range.  Would continue.  Note her A1c is trivially elevated at 6.1.  She is on prednisone right now.   Discussed diet Also discussed increasing her aerobic activity.  2  Thyroid TSH is normal.    3.  CAD.  Patient has mild plaquing on CT scan done a year ago.  Symptom-free.  Very active.  Risk factor modification planned  3  Hx elevated.  ALT is minimally elevated.  Follow  5.  Shoulder discomfort.  Patient with obvious discomfort when she moves her shoulders.  I recommended she consider contacting  Charlann Boxer or Valeda Malm for PT.  Again at all dates to when she had her Covid booster and flu vaccine  Plan for follow-up in 1 year.   Current medicines are reviewed at length with the patient today.  The patient does not have concerns regarding medicines.  Signed, Dorris Carnes, MD  10/22/2020 9:06 AM    [address], [place], [place]  [address] Phone: [phone]; Fax: [phone]

## 2020-10-22 ENCOUNTER — Encounter: Payer: Self-pay | Admitting: Internal Medicine

## 2020-10-22 ENCOUNTER — Ambulatory Visit: Payer: PPO | Admitting: Internal Medicine

## 2020-10-22 VITALS — BP 136/78 | HR 86 | Ht 63.0 in | Wt 146.0 lb

## 2020-10-22 DIAGNOSIS — E785 Hyperlipidemia, unspecified: Secondary | ICD-10-CM

## 2020-10-22 NOTE — Patient Instructions (Signed)
Medication Instructions:  No changes *If you need a refill on your cardiac medications before your next appointment, please call your pharmacy*   Lab Work: none If you have labs (blood work) drawn today and your tests are completely normal, you will receive your results only by: . MyChart Message (if you have MyChart) OR . A paper copy in the mail If you have any lab test that is abnormal or we need to change your treatment, we will call you to review the results.   Testing/Procedures: none   Follow-Up: At [facility], you and your health needs are our priority.  As part of our continuing mission to provide you with exceptional heart care, we have created designated Provider Care Teams.  These Care Teams include your primary Cardiologist (physician) and Advanced Practice Providers (APPs -  Physician Assistants and Nurse Practitioners) who all work together to provide you with the care you need, when you need it.   Your next appointment:   12 month(s)  The format for your next appointment:   In Person  Provider:   You may see Paula Ross, MD or one of the following Advanced Practice Providers on your designated Care Team:    Scott Weaver, PA-C  Vin Bhagat, PA-C   Other Instructions   

## 2020-10-25 ENCOUNTER — Encounter: Payer: Self-pay | Admitting: Dermatology

## 2020-10-25 IMAGING — RF DG LUMBAR SPINE COMPLETE 4+V
1 series · 7 of 7 positions shown · non-contrast
Comparison: 11/05/2019

CLINICAL DATA: Lumbar fusion

EXAM:
DG C-ARM 1-60 MIN; LUMBAR SPINE - COMPLETE 4+ VIEW

[Series 1: run · 7 of 7 slices shown]
[im 1/7]
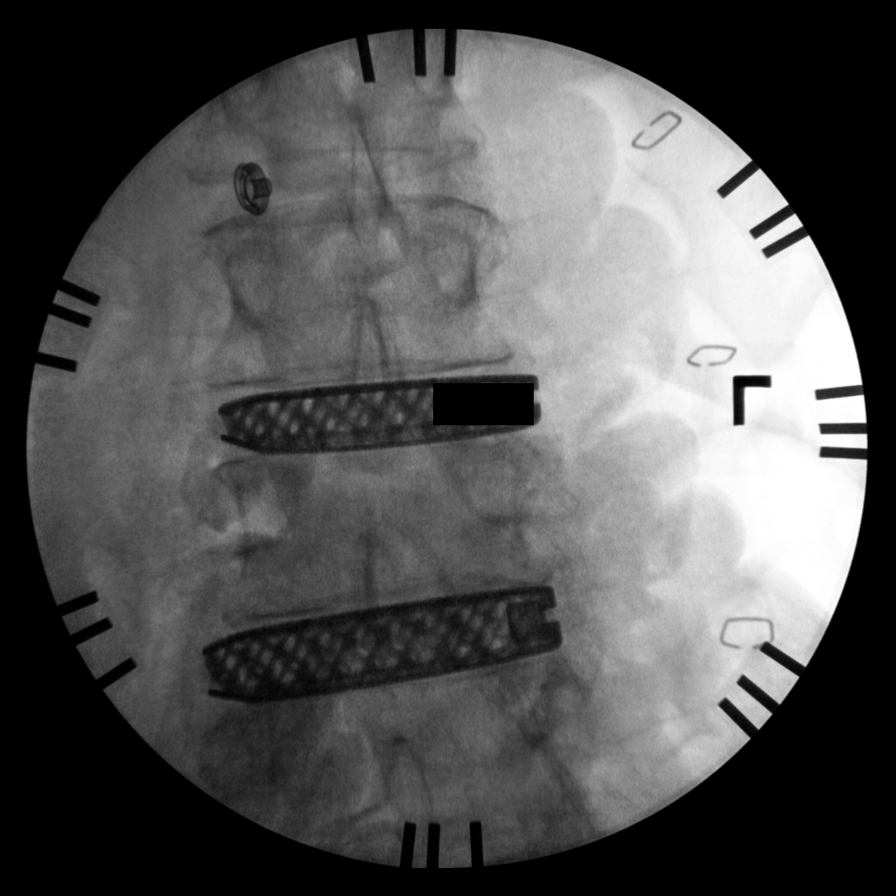
[im 2/7]
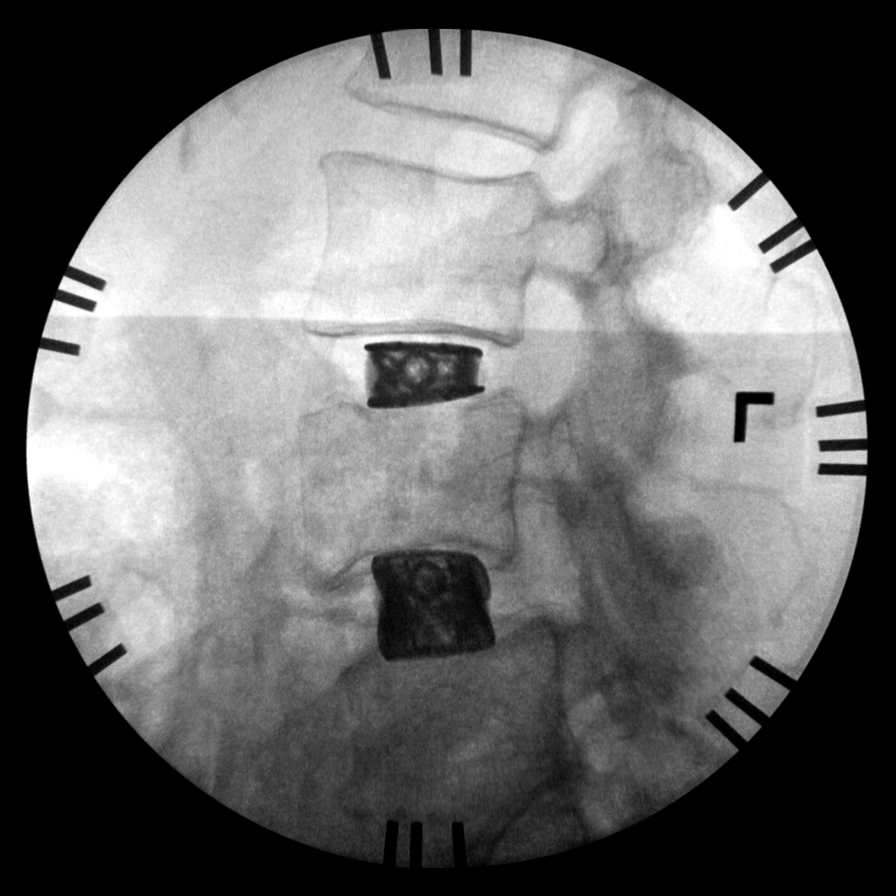
[im 3/7]
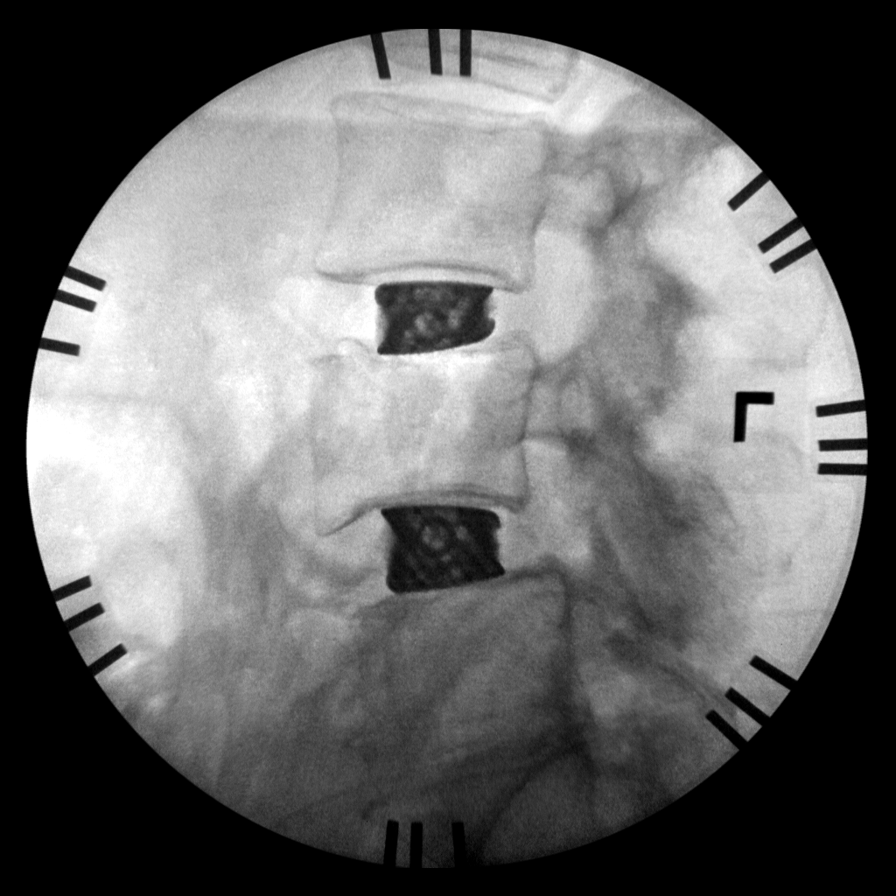
[im 4/7]
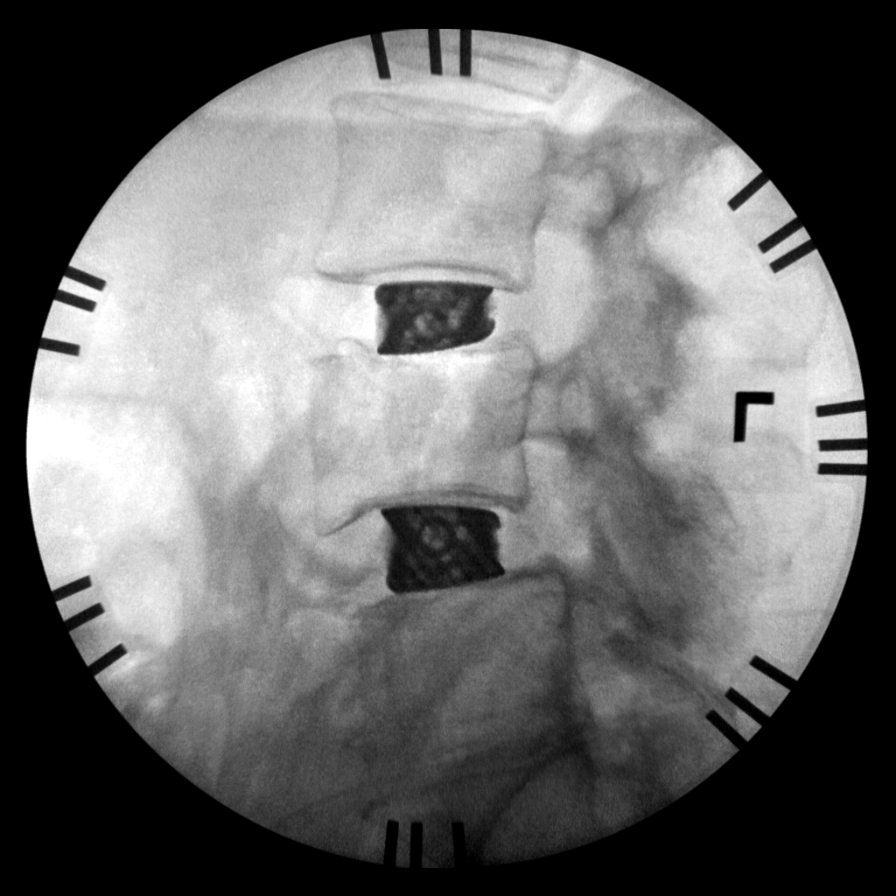
[im 5/7]
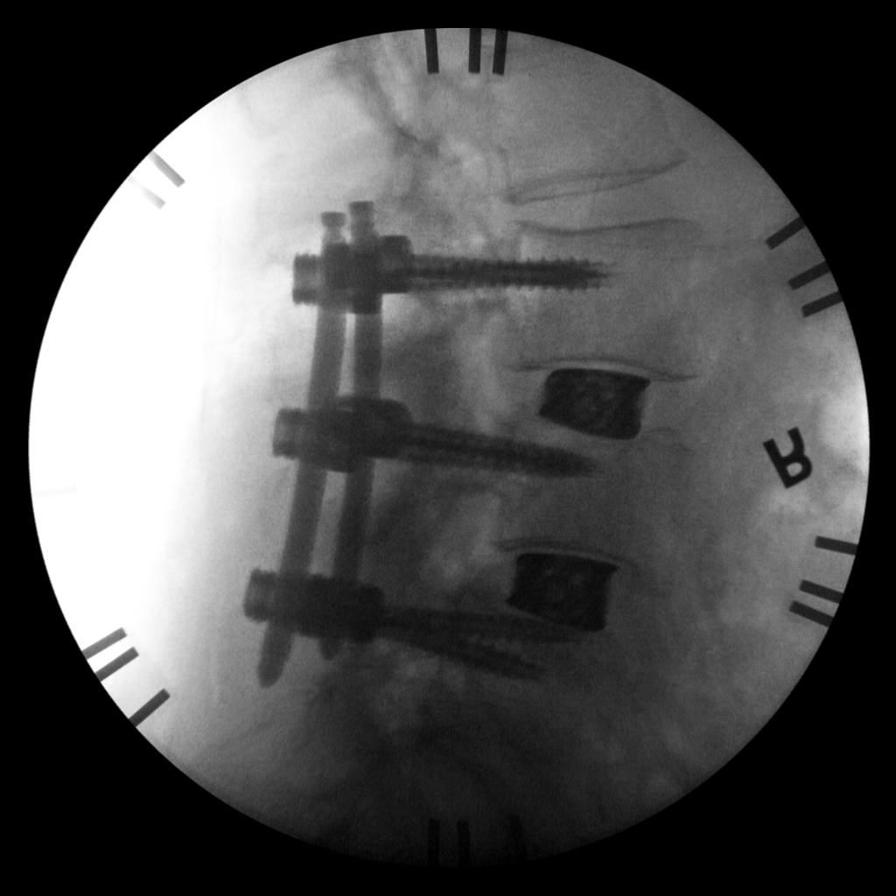
[im 6/7]
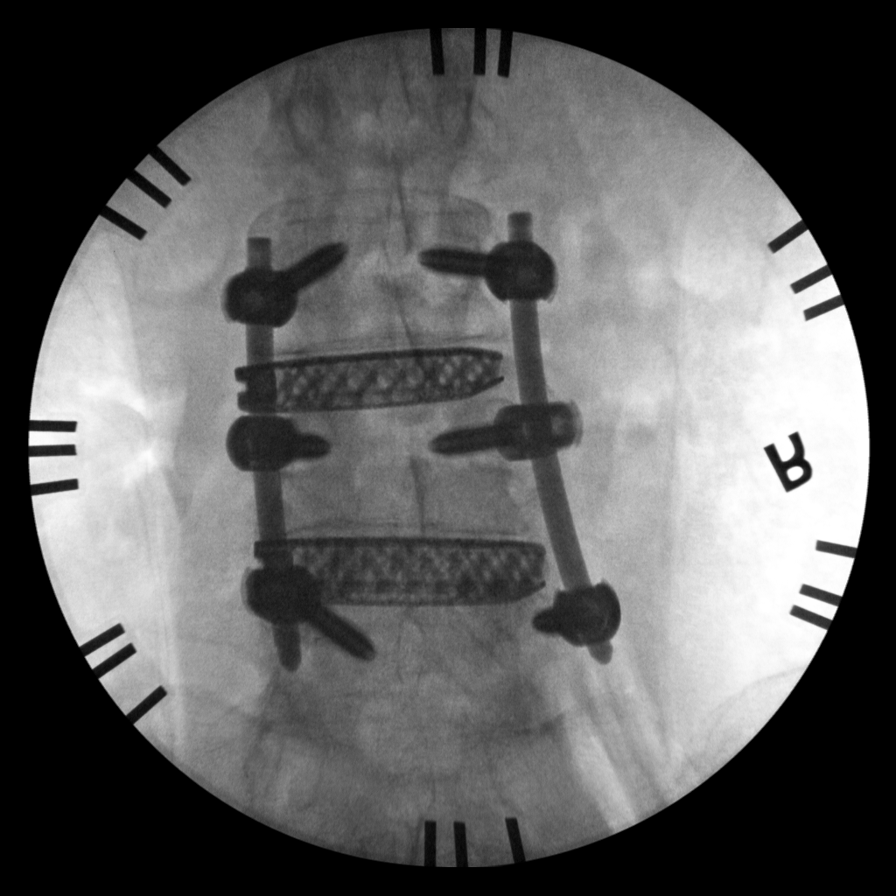
[im 7/7]
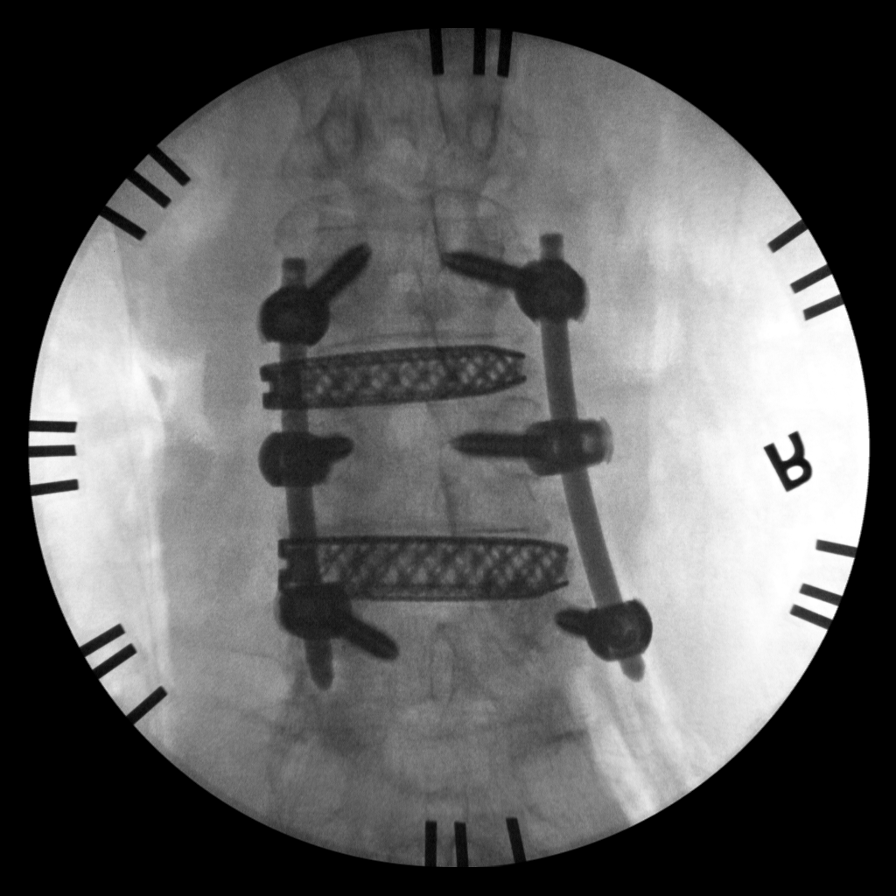

[7 of 7 positions shown; findings below may reference images not displayed]

FLUOROSCOPY TIME:  Fluoroscopy Time:  3 minutes 11 seconds

Radiation Exposure Index (if provided by the fluoroscopic device):
Not available

Number of Acquired Spot Images: 7
FINDINGS: Interbody fusion is noted at L3-4 and L4-5. Minimal anterolisthesis
is noted of L4 on L5 stable from the prior examination.
IMPRESSION: Interval fusion at L3-4 and L4-5.

## 2020-10-25 NOTE — Progress Notes (Signed)
   Follow-Up Visit   Subjective  Courtney Horne is a 69 y.o. female who presents for the following: Procedure (spot under left eye removed).  Growth along left lower central eyelid margin Location:  Duration:  Quality:  Associated Signs/Symptoms: Modifying Factors:  Severity:  Timing: Context:   Objective  Well appearing patient in no apparent distress; mood and affect are within normal limits.  A focused examination was performed including Head and neck examined.. Relevant physical exam findings are noted in the Assessment and Plan.   Assessment & Plan    Neoplasm of uncertain behavior of skin Left Lower Eyelid   Skin / nail biopsy Type of biopsy: tangential   Informed consent: discussed and consent obtained   Timeout: patient name, date of birth, surgical site, and procedure verified   Anesthesia: the lesion was anesthetized in a standard fashion   Anesthetic:  1% lidocaine w/ epinephrine 1-100,000 local infiltration Instrument used: flexible razor blade   Hemostasis achieved with: ferric subsulfate   Outcome: patient tolerated procedure well   Post-procedure details: sterile dressing applied and wound care instructions given   Dressing type: bandage and petrolatum    Patient  told preoperatively of risks of scar, recurrence, infection, black eye.  Just doing infiltrated lidocaine away from the lesion produced a bruising which will likely take 1 to 2 weeks to clear.  Careful scissor excision followed by light electrocautery; no bleeding from biopsy site.     I, Lavonna Monarch, MD, have reviewed all documentation for this visit.  The documentation on 10/25/20 for the exam, diagnosis, procedures, and orders are all accurate and complete.

## 2020-10-29 ENCOUNTER — Ambulatory Visit: Payer: PPO | Admitting: Obstetrics and Gynecology

## 2020-11-01 ENCOUNTER — Ambulatory Visit: Payer: PPO | Admitting: Adult Health

## 2020-11-01 ENCOUNTER — Other Ambulatory Visit: Payer: Self-pay

## 2020-11-01 ENCOUNTER — Encounter: Payer: Self-pay | Admitting: Adult Health

## 2020-11-01 VITALS — BP 130/77 | HR 72 | Ht 63.0 in | Wt 148.0 lb

## 2020-11-01 DIAGNOSIS — G43001 Migraine without aura, not intractable, with status migrainosus: Secondary | ICD-10-CM | POA: Diagnosis not present

## 2020-11-01 DIAGNOSIS — G4700 Insomnia, unspecified: Secondary | ICD-10-CM

## 2020-11-01 NOTE — Progress Notes (Signed)
.meg    PATIENT: Courtney Horne DOB: May 23, 1951  REASON FOR VISIT: follow up HISTORY FROM: patient  HISTORY OF PRESENT ILLNESS: Today 11/01/20: Courtney Horne is a 69 year old female with a history migraine headaches. She returns today for follow-up.  She states overall she has been doing well with her headaches.  She has approximately 1-2 headaches a month.  She continues to use amitriptyline and Imitrex.  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  She states that patient did not start until after she got the Covid booster.  She has had x-rays with her orthopedist that was unremarkable.  Her sedimentation rate was elevated and she has a referral to rheumatology.  She returns today for an evaluation.   10/28/19: Courtney Horne is a 69 year old female with a history of migraine headaches.  She returns today for follow-up.  She states that her headaches have been under relatively good control.  She may have approximately 3 headaches a month.  She states that her headaches are typically triggered by alcohol or weather.  She states that Imitrex continues to work well for her.  She states that she golfs 3 times a week.  If it is too cold to golf she will go walking.  She also does puzzles.  She states that she has been taking amitriptyline 75 mg for approximately 10 years.  She states that it is worked well for her migraines and her sleep.  She has noticed recently she has been waking up at least once at night and has difficulty going back to sleep.  She is questioning whether amitriptyline could be increased to 100 mg.  She denies any issues with constipation, urinary retention, blurred vision or dry mouth.  I reviewed recent EKG completed November 2020.   HISTORY 10/09/18:  Courtney Horne is a 69 year old female with a history of migraine.  She returns today for follow-up.  She reports that her headaches are  relatively good control.  She has approximately 1 headache a week.  Her headaches tend to occur on the left side of the face.  She denies photophobia or phonophobia.  She states that she takes Imitrex her headache typically resolves in 15 to 30 minutes.  She notes to that with amitriptyline she was sleeping well but now she is waking up 2-3 times at night.  She returns today for evaluation.  REVIEW OF SYSTEMS: Out of a complete 14 system review of symptoms, the patient complains only of the following symptoms, and all other reviewed systems are negative.  See HPI  ALLERGIES: Allergies  Allergen Reactions  . Hydrocodone-Acetaminophen Nausea Only  . Penicillins Itching and Rash    Did it involve swelling of the face/tongue/throat, SOB, or low BP? No Did it involve sudden or severe rash/hives, skin peeling, or any reaction on the inside of your mouth or nose? No Did you need to seek medical attention at a hospital or doctor's office? No When did it last happen?15 years ago If all above answers are "NO", may proceed with cephalosporin use.   . Zocor [Simvastatin] Other (See Comments)    UNSPECIFIED REACTION   . Codeine Nausea And Vomiting  . Tramadol Hcl Itching    HOME MEDICATIONS: Outpatient Medications Prior to Visit  Medication Sig Dispense Refill  . amitriptyline (ELAVIL) 50 MG tablet Take 2 tablets (100 mg total) by mouth at bedtime. 180 tablet 3  .  Biotin 5000 MCG TABS Take 5,000 mcg by mouth daily.     . budesonide-formoterol (SYMBICORT) 160-4.5 MCG/ACT inhaler Inhale 2 puffs into the lungs every morning.     . Carboxymethylcell-Hypromellose (GENTEAL) 0.25-0.3 % GEL Place 1 drop into both eyes daily.    . Ergocalciferol 2000 units CAPS Take 3,000 Units by mouth daily. (Patient taking differently: Take 4,000 Units by mouth daily. ) 30 capsule   . estradiol (VIVELLE-DOT) 0.0375 MG/24HR PLACE 1 PATCH ONTO THE SKIN 2 (TWO) TIMES A WEEK. APPLY ANYWHERE ON LOWER ABDOMEN TWICE  WEEKLY. 24 patch 0  . fexofenadine (ALLEGRA) 180 MG tablet Take 180 mg by mouth daily.    . Multiple Vitamin (MULTIVITAMIN WITH MINERALS) TABS tablet Take 1 tablet by mouth daily.    Marland Kitchen omeprazole (PRILOSEC) 10 MG capsule Take 10 mg by mouth daily.     . predniSONE (DELTASONE) 10 MG tablet Take 10 mg by mouth daily with breakfast.    . Probiotic Product (PROBIOTIC DAILY PO) Take 1 tablet by mouth daily.    . rosuvastatin (CRESTOR) 40 MG tablet Take 1 tablet (40 mg total) by mouth daily. Please keep upcoming appt in December with Dr. Harrington Challenger for future refills. Thank you 90 tablet 0  . SUMAtriptan (IMITREX) 100 MG tablet TAKE 1 TABLET AS NEEDED FOR MIGRAINE, MAY REPEAT IN 2 HOURS IF HEADACHE PERSISTS OR RECURS 30 tablet 1   No facility-administered medications prior to visit.    PAST MEDICAL HISTORY: Past Medical History:  Diagnosis Date  . Adenomyosis   . Asthma    "really well controlled"  . Asthma   . Cystocele   . Fibroid   . High cholesterol   . Menopausal symptoms   . Migraine   . Migraine   . Osteopenia 2017   Resolved.  BMD normal in 2017.  Marland Kitchen PONV (postoperative nausea and vomiting)   . Rectocele   . Urinary incontinence   . Uterine prolapse     PAST SURGICAL HISTORY: Past Surgical History:  Procedure Laterality Date  . ANTERIOR LAT LUMBAR FUSION Left 01/27/2020   Procedure: Left Lumbar three-four Lumbar four-five Anterolateral lumbar interbody fusion;  Surgeon: Erline Levine, MD;  Location: [place];  Service: Neurosurgery;  Laterality: Left;  . APPENDECTOMY    . BACK SURGERY  1999   L4-L5  . BLADDER SUSPENSION    . CATARACT EXTRACTION, BILATERAL  07/2018  . COLONOSCOPY  10/2011  . LUMBAR PERCUTANEOUS PEDICLE SCREW 2 LEVEL N/A 01/27/2020   Procedure: Percutaneous Pedicle Screw Fixation Lumbar three-Lumbar five;  Surgeon: Erline Levine, MD;  Location: [place];  Service: Neurosurgery;  Laterality: N/A;  . NASAL SINUS SURGERY    . TONSILLECTOMY    . VAGINAL HYSTERECTOMY  2011    Vag. Hyst BSO A/P repair-Sling    FAMILY HISTORY: Family History  Problem Relation Age of Onset  . Diabetes Mother   . Leukemia Mother   . Cancer Mother        leukemia  . Heart disease Father   . Retinitis pigmentosa Father   . Migraines Father   . Healthy Sister   . Healthy Sister   . Healthy Child   . Healthy Child   . Leukemia Brother     SOCIAL HISTORY: Social History   Socioeconomic History  . Marital status: Married    Spouse name: [name]  . Number of children: 2  . Years of education: 25  . Highest education level: Not on file  Occupational History  Employer: [employer]    Comment: [employer]  Tobacco Use  . Smoking status: Former Smoker    Quit date: 05/08/1972    Years since quitting: 48.5  . Smokeless tobacco: Never Used  . Tobacco comment: in college  Vaping Use  . Vaping Use: Never used  Substance and Sexual Activity  . Alcohol use: Yes    Alcohol/week: 2.0 standard drinks    Types: 2 Glasses of wine per week  . Drug use: No  . Sexual activity: Yes    Partners: Male    Birth control/protection: Surgical    Comment: hysterectomy  Other Topics Concern  . Not on file  Social History Narrative   Right handed.  Caffeine 2 cups daily, Married, 2 kids, 5 grand kids.    Social Determinants of Health   Financial Resource Strain: Not on file  Food Insecurity: Not on file  Transportation Needs: Not on file  Physical Activity: Not on file  Stress: Not on file  Social Connections: Not on file  Intimate Partner Violence: Not on file      PHYSICAL EXAM  Vitals:   11/01/20 0930  BP: 130/77  Pulse: 72  Weight: 148 lb (67.1 kg)  Height: 5\' 3"  (1.6 m)   Body mass index is 26.22 kg/m.  Generalized: Well developed, in no acute distress   Neurological examination  Mentation: Alert oriented to time, place, history taking. Follows all commands speech and language fluent Cranial nerve II-XII: Pupils were equal round reactive to light. Extraocular  movements were full, visual field were full on confrontational test. . Head turning and shoulder shrug  were normal and symmetric. Motor: The motor testing reveals 5 over 5 strength of all 4 extremities. Good symmetric motor tone is noted throughout.  Sensory: Sensory testing is intact to soft touch on all 4 extremities. No evidence of extinction is noted.  Coordination: Cerebellar testing reveals good finger-nose-finger and heel-to-shin bilaterally.  Gait and station: Gait is normal.  Reflexes: Deep tendon reflexes are symmetric and normal bilaterally.   DIAGNOSTIC DATA (LABS, IMAGING, TESTING) - I reviewed patient records, labs, notes, testing and imaging myself where available.  Lab Results  Component Value Date   WBC 9.4 10/06/2020   HGB 12.8 10/06/2020   HCT 39.4 10/06/2020   MCV 86 10/06/2020   PLT 273 10/06/2020      Component Value Date/Time   NA 141 10/06/2020 0827   K 4.0 10/06/2020 0827   CL 104 10/06/2020 0827   CO2 24 10/06/2020 0827   GLUCOSE 84 10/06/2020 0827   GLUCOSE 107 (H) 01/23/2020 1206   BUN 18 10/06/2020 0827   CREATININE 0.90 10/06/2020 0827   CREATININE 0.92 01/10/2016 0934   CALCIUM 9.7 10/06/2020 0827   PROT 6.7 10/06/2020 0827   ALBUMIN 4.4 10/06/2020 0827   AST 28 10/06/2020 0827   ALT 41 (H) 10/06/2020 0827   ALKPHOS 77 10/06/2020 0827   BILITOT 0.5 10/06/2020 0827   GFRNONAA 65 10/06/2020 0827   GFRAA 74 10/06/2020 0827   Lab Results  Component Value Date   CHOL 160 05/11/2017   HDL 73 05/11/2017   LDLCALC 104 (H) 01/23/2017   TRIG 65 05/11/2017   CHOLHDL 3.1 01/23/2017   Lab Results  Component Value Date   HGBA1C 6.1 (H) 10/06/2020    Lab Results  Component Value Date   TSH 3.530 10/06/2020      ASSESSMENT AND PLAN 69 y.o. year old female  has a past medical history of  Adenomyosis, Asthma, Asthma, Cystocele, Fibroid, High cholesterol, Menopausal symptoms, Migraine, Migraine, Osteopenia (2017), PONV (postoperative nausea and  vomiting), Rectocele, Urinary incontinence, and Uterine prolapse. here with :  1.  Migraine headaches 2.  Insomnia   -- Continue Amitriptyline 100mg  at bedtime -- advised to call if symptoms worsen or develop new symptoms -- FU in 1 year  I spent 25 minutes of face-to-face and non-face-to-face time with patient.  This included previsit chart review, lab review, study review, order entry, electronic health record documentation, patient education.    Ward Givens, MSN, NP-C 11/01/2020, 9:20 AM [facility] [address], [place], [place] [address] [phone]

## 2020-11-01 NOTE — Patient Instructions (Signed)
Your Plan:  Continue Amitriptyline 100 mg at bedtime If your symptoms worsen or you develop new symptoms please let us know.   Thank you for coming to see [place] at [facility]. I hope we have been able to provide you high quality care today.  You may receive a patient satisfaction survey over the next few weeks. We would appreciate your feedback and comments so that we may continue to improve ourselves and the health of our patients.

## 2020-11-03 DIAGNOSIS — E663 Overweight: Secondary | ICD-10-CM | POA: Diagnosis not present

## 2020-11-03 DIAGNOSIS — Z6826 Body mass index (BMI) 26.0-26.9, adult: Secondary | ICD-10-CM | POA: Diagnosis not present

## 2020-11-03 DIAGNOSIS — M353 Polymyalgia rheumatica: Secondary | ICD-10-CM | POA: Diagnosis not present

## 2020-11-03 DIAGNOSIS — Z7952 Long term (current) use of systemic steroids: Secondary | ICD-10-CM | POA: Diagnosis not present

## 2020-11-06 ENCOUNTER — Other Ambulatory Visit: Payer: Self-pay | Admitting: Adult Health

## 2020-11-20 DIAGNOSIS — M353 Polymyalgia rheumatica: Secondary | ICD-10-CM

## 2020-11-20 HISTORY — DX: Polymyalgia rheumatica: M35.3

## 2020-12-02 DIAGNOSIS — Z03818 Encounter for observation for suspected exposure to other biological agents ruled out: Secondary | ICD-10-CM | POA: Diagnosis not present

## 2020-12-02 DIAGNOSIS — Z20822 Contact with and (suspected) exposure to covid-19: Secondary | ICD-10-CM | POA: Diagnosis not present

## 2020-12-14 DIAGNOSIS — H26493 Other secondary cataract, bilateral: Secondary | ICD-10-CM | POA: Diagnosis not present

## 2020-12-14 DIAGNOSIS — Z961 Presence of intraocular lens: Secondary | ICD-10-CM | POA: Diagnosis not present

## 2020-12-14 DIAGNOSIS — H524 Presbyopia: Secondary | ICD-10-CM | POA: Diagnosis not present

## 2020-12-14 DIAGNOSIS — H52203 Unspecified astigmatism, bilateral: Secondary | ICD-10-CM | POA: Diagnosis not present

## 2020-12-22 DIAGNOSIS — Z7952 Long term (current) use of systemic steroids: Secondary | ICD-10-CM | POA: Diagnosis not present

## 2020-12-22 DIAGNOSIS — K209 Esophagitis, unspecified without bleeding: Secondary | ICD-10-CM | POA: Diagnosis not present

## 2020-12-22 DIAGNOSIS — Z6827 Body mass index (BMI) 27.0-27.9, adult: Secondary | ICD-10-CM | POA: Diagnosis not present

## 2020-12-22 DIAGNOSIS — M353 Polymyalgia rheumatica: Secondary | ICD-10-CM | POA: Diagnosis not present

## 2020-12-22 DIAGNOSIS — E663 Overweight: Secondary | ICD-10-CM | POA: Diagnosis not present

## 2020-12-22 DIAGNOSIS — M25559 Pain in unspecified hip: Secondary | ICD-10-CM | POA: Diagnosis not present

## 2020-12-22 DIAGNOSIS — H26491 Other secondary cataract, right eye: Secondary | ICD-10-CM | POA: Diagnosis not present

## 2020-12-22 DIAGNOSIS — F309 Manic episode, unspecified: Secondary | ICD-10-CM | POA: Diagnosis not present

## 2020-12-24 DIAGNOSIS — E785 Hyperlipidemia, unspecified: Secondary | ICD-10-CM | POA: Diagnosis not present

## 2020-12-24 DIAGNOSIS — F309 Manic episode, unspecified: Secondary | ICD-10-CM | POA: Diagnosis not present

## 2020-12-25 ENCOUNTER — Other Ambulatory Visit: Payer: Self-pay | Admitting: Adult Health

## 2020-12-25 ENCOUNTER — Other Ambulatory Visit: Payer: Self-pay | Admitting: Obstetrics and Gynecology

## 2020-12-25 ENCOUNTER — Other Ambulatory Visit: Payer: Self-pay | Admitting: Internal Medicine

## 2020-12-25 DIAGNOSIS — Z01419 Encounter for gynecological examination (general) (routine) without abnormal findings: Secondary | ICD-10-CM

## 2020-12-27 DIAGNOSIS — M545 Low back pain, unspecified: Secondary | ICD-10-CM | POA: Diagnosis not present

## 2020-12-27 DIAGNOSIS — M4316 Spondylolisthesis, lumbar region: Secondary | ICD-10-CM | POA: Diagnosis not present

## 2020-12-27 DIAGNOSIS — Z1231 Encounter for screening mammogram for malignant neoplasm of breast: Secondary | ICD-10-CM | POA: Diagnosis not present

## 2020-12-27 DIAGNOSIS — M5416 Radiculopathy, lumbar region: Secondary | ICD-10-CM | POA: Diagnosis not present

## 2020-12-27 DIAGNOSIS — M48061 Spinal stenosis, lumbar region without neurogenic claudication: Secondary | ICD-10-CM | POA: Diagnosis not present

## 2020-12-27 NOTE — Telephone Encounter (Signed)
Annual exam scheduled on 03/14/21

## 2020-12-29 DIAGNOSIS — D72829 Elevated white blood cell count, unspecified: Secondary | ICD-10-CM | POA: Diagnosis not present

## 2020-12-29 DIAGNOSIS — Z7952 Long term (current) use of systemic steroids: Secondary | ICD-10-CM | POA: Diagnosis not present

## 2020-12-29 DIAGNOSIS — F309 Manic episode, unspecified: Secondary | ICD-10-CM | POA: Diagnosis not present

## 2020-12-29 DIAGNOSIS — Z6826 Body mass index (BMI) 26.0-26.9, adult: Secondary | ICD-10-CM | POA: Diagnosis not present

## 2020-12-29 DIAGNOSIS — F19959 Other psychoactive substance use, unspecified with psychoactive substance-induced psychotic disorder, unspecified: Secondary | ICD-10-CM | POA: Diagnosis not present

## 2020-12-29 DIAGNOSIS — H26492 Other secondary cataract, left eye: Secondary | ICD-10-CM | POA: Diagnosis not present

## 2020-12-29 DIAGNOSIS — E663 Overweight: Secondary | ICD-10-CM | POA: Diagnosis not present

## 2020-12-29 DIAGNOSIS — K209 Esophagitis, unspecified without bleeding: Secondary | ICD-10-CM | POA: Diagnosis not present

## 2020-12-29 DIAGNOSIS — M353 Polymyalgia rheumatica: Secondary | ICD-10-CM | POA: Diagnosis not present

## 2020-12-29 DIAGNOSIS — M25559 Pain in unspecified hip: Secondary | ICD-10-CM | POA: Diagnosis not present

## 2021-01-04 DIAGNOSIS — F320 Major depressive disorder, single episode, mild: Secondary | ICD-10-CM | POA: Diagnosis not present

## 2021-01-06 DIAGNOSIS — F19959 Other psychoactive substance use, unspecified with psychoactive substance-induced psychotic disorder, unspecified: Secondary | ICD-10-CM | POA: Diagnosis not present

## 2021-01-06 DIAGNOSIS — K209 Esophagitis, unspecified without bleeding: Secondary | ICD-10-CM | POA: Diagnosis not present

## 2021-01-06 DIAGNOSIS — M25559 Pain in unspecified hip: Secondary | ICD-10-CM | POA: Diagnosis not present

## 2021-01-06 DIAGNOSIS — M353 Polymyalgia rheumatica: Secondary | ICD-10-CM | POA: Diagnosis not present

## 2021-01-06 DIAGNOSIS — D72829 Elevated white blood cell count, unspecified: Secondary | ICD-10-CM | POA: Diagnosis not present

## 2021-01-06 DIAGNOSIS — M2550 Pain in unspecified joint: Secondary | ICD-10-CM | POA: Diagnosis not present

## 2021-01-06 DIAGNOSIS — E663 Overweight: Secondary | ICD-10-CM | POA: Diagnosis not present

## 2021-01-06 DIAGNOSIS — Z7952 Long term (current) use of systemic steroids: Secondary | ICD-10-CM | POA: Diagnosis not present

## 2021-01-06 DIAGNOSIS — F309 Manic episode, unspecified: Secondary | ICD-10-CM | POA: Diagnosis not present

## 2021-01-06 DIAGNOSIS — Z6826 Body mass index (BMI) 26.0-26.9, adult: Secondary | ICD-10-CM | POA: Diagnosis not present

## 2021-01-07 DIAGNOSIS — K219 Gastro-esophageal reflux disease without esophagitis: Secondary | ICD-10-CM | POA: Diagnosis not present

## 2021-01-07 DIAGNOSIS — E785 Hyperlipidemia, unspecified: Secondary | ICD-10-CM | POA: Diagnosis not present

## 2021-01-07 DIAGNOSIS — F309 Manic episode, unspecified: Secondary | ICD-10-CM | POA: Diagnosis not present

## 2021-01-07 DIAGNOSIS — M353 Polymyalgia rheumatica: Secondary | ICD-10-CM | POA: Diagnosis not present

## 2021-01-11 DIAGNOSIS — F0633 Mood disorder due to known physiological condition with manic features: Secondary | ICD-10-CM | POA: Diagnosis not present

## 2021-01-18 ENCOUNTER — Other Ambulatory Visit: Payer: Self-pay | Admitting: Obstetrics and Gynecology

## 2021-01-18 DIAGNOSIS — Z01419 Encounter for gynecological examination (general) (routine) without abnormal findings: Secondary | ICD-10-CM

## 2021-01-31 DIAGNOSIS — M545 Low back pain, unspecified: Secondary | ICD-10-CM | POA: Diagnosis not present

## 2021-02-02 DIAGNOSIS — D72829 Elevated white blood cell count, unspecified: Secondary | ICD-10-CM | POA: Diagnosis not present

## 2021-02-02 DIAGNOSIS — M353 Polymyalgia rheumatica: Secondary | ICD-10-CM | POA: Diagnosis not present

## 2021-02-02 DIAGNOSIS — K209 Esophagitis, unspecified without bleeding: Secondary | ICD-10-CM | POA: Diagnosis not present

## 2021-02-02 DIAGNOSIS — M545 Low back pain, unspecified: Secondary | ICD-10-CM | POA: Diagnosis not present

## 2021-02-02 DIAGNOSIS — M2550 Pain in unspecified joint: Secondary | ICD-10-CM | POA: Diagnosis not present

## 2021-02-02 DIAGNOSIS — F309 Manic episode, unspecified: Secondary | ICD-10-CM | POA: Diagnosis not present

## 2021-02-02 DIAGNOSIS — E663 Overweight: Secondary | ICD-10-CM | POA: Diagnosis not present

## 2021-02-02 DIAGNOSIS — Z6827 Body mass index (BMI) 27.0-27.9, adult: Secondary | ICD-10-CM | POA: Diagnosis not present

## 2021-02-02 DIAGNOSIS — Z7952 Long term (current) use of systemic steroids: Secondary | ICD-10-CM | POA: Diagnosis not present

## 2021-02-02 DIAGNOSIS — F19959 Other psychoactive substance use, unspecified with psychoactive substance-induced psychotic disorder, unspecified: Secondary | ICD-10-CM | POA: Diagnosis not present

## 2021-02-02 DIAGNOSIS — M25559 Pain in unspecified hip: Secondary | ICD-10-CM | POA: Diagnosis not present

## 2021-02-04 DIAGNOSIS — M545 Low back pain, unspecified: Secondary | ICD-10-CM | POA: Diagnosis not present

## 2021-02-18 DIAGNOSIS — M545 Low back pain, unspecified: Secondary | ICD-10-CM | POA: Diagnosis not present

## 2021-02-21 DIAGNOSIS — M545 Low back pain, unspecified: Secondary | ICD-10-CM | POA: Diagnosis not present

## 2021-02-25 DIAGNOSIS — M545 Low back pain, unspecified: Secondary | ICD-10-CM | POA: Diagnosis not present

## 2021-02-28 DIAGNOSIS — F0633 Mood disorder due to known physiological condition with manic features: Secondary | ICD-10-CM | POA: Diagnosis not present

## 2021-02-28 DIAGNOSIS — M545 Low back pain, unspecified: Secondary | ICD-10-CM | POA: Diagnosis not present

## 2021-03-02 DIAGNOSIS — Z7952 Long term (current) use of systemic steroids: Secondary | ICD-10-CM | POA: Diagnosis not present

## 2021-03-02 DIAGNOSIS — M2550 Pain in unspecified joint: Secondary | ICD-10-CM | POA: Diagnosis not present

## 2021-03-02 DIAGNOSIS — K209 Esophagitis, unspecified without bleeding: Secondary | ICD-10-CM | POA: Diagnosis not present

## 2021-03-02 DIAGNOSIS — Z6827 Body mass index (BMI) 27.0-27.9, adult: Secondary | ICD-10-CM | POA: Diagnosis not present

## 2021-03-02 DIAGNOSIS — D72829 Elevated white blood cell count, unspecified: Secondary | ICD-10-CM | POA: Diagnosis not present

## 2021-03-02 DIAGNOSIS — F309 Manic episode, unspecified: Secondary | ICD-10-CM | POA: Diagnosis not present

## 2021-03-02 DIAGNOSIS — M25559 Pain in unspecified hip: Secondary | ICD-10-CM | POA: Diagnosis not present

## 2021-03-02 DIAGNOSIS — M545 Low back pain, unspecified: Secondary | ICD-10-CM | POA: Diagnosis not present

## 2021-03-02 DIAGNOSIS — F19959 Other psychoactive substance use, unspecified with psychoactive substance-induced psychotic disorder, unspecified: Secondary | ICD-10-CM | POA: Diagnosis not present

## 2021-03-02 DIAGNOSIS — M353 Polymyalgia rheumatica: Secondary | ICD-10-CM | POA: Diagnosis not present

## 2021-03-02 DIAGNOSIS — E663 Overweight: Secondary | ICD-10-CM | POA: Diagnosis not present

## 2021-03-07 ENCOUNTER — Ambulatory Visit: Payer: PPO | Admitting: Obstetrics and Gynecology

## 2021-03-08 DIAGNOSIS — M545 Low back pain, unspecified: Secondary | ICD-10-CM | POA: Diagnosis not present

## 2021-03-14 ENCOUNTER — Encounter: Payer: Self-pay | Admitting: Obstetrics and Gynecology

## 2021-03-14 ENCOUNTER — Other Ambulatory Visit: Payer: Self-pay

## 2021-03-14 ENCOUNTER — Ambulatory Visit: Payer: PPO | Admitting: Obstetrics and Gynecology

## 2021-03-14 VITALS — BP 138/74 | HR 75 | Ht 63.0 in | Wt 152.0 lb

## 2021-03-14 DIAGNOSIS — Z01419 Encounter for gynecological examination (general) (routine) without abnormal findings: Secondary | ICD-10-CM | POA: Diagnosis not present

## 2021-03-14 DIAGNOSIS — Z79818 Long term (current) use of other agents affecting estrogen receptors and estrogen levels: Secondary | ICD-10-CM

## 2021-03-14 DIAGNOSIS — Z79890 Hormone replacement therapy (postmenopausal): Secondary | ICD-10-CM

## 2021-03-14 DIAGNOSIS — Z79899 Other long term (current) drug therapy: Secondary | ICD-10-CM

## 2021-03-14 DIAGNOSIS — Z1239 Encounter for other screening for malignant neoplasm of breast: Secondary | ICD-10-CM | POA: Diagnosis not present

## 2021-03-14 MED ORDER — ESTRADIOL 0.0375 MG/24HR TD PTTW: MEDICATED_PATCH | TRANSDERMAL | 3 refills | Status: DC

## 2021-03-14 NOTE — Progress Notes (Signed)
GYNECOLOGY  VISIT   HPI: 70 y.o.   Married  Caucasian  female   [phone] with No LMP recorded. Patient has had a hysterectomy.   here for breast and pelvic exam. Here to refill Vivelle Dot.  Dx'd this year with PMR. On MTX for this.  Heat helps. Denies hx of giant cell arteritis.   Has vaginal dryness and dry eyes.   Wants to continue ERT.  Gabapentin for her back helped with pain but she did not like how she felt on it.   Taking Clonazepam and Elavil to help with sleep.  Thinks she did a BMD with Dr. Joylene Draft.   GYNECOLOGIC HISTORY: No LMP recorded. Patient has had a hysterectomy. Contraception:  TVH/BSO and prolapse repair Menopausal hormone therapy:  Vivelle Dot 0.0341m Last mammogram:  Had it done recently at [facility].  Will get a copy. Last pap smear:   2011 normal        OB History    Gravida  4   Para  2   Term  2   Preterm      AB  2   Living  2     SAB      IAB      Ectopic      Multiple      Live Births                 Patient Active Problem List   Diagnosis Date Noted  . Spondylolisthesis of lumbar region 01/27/2020  . Osteopenia   . Fibroid   . Adenomyosis   . Uterine prolapse   . Urinary incontinence   . Cystocele   . Rectocele   . Menopausal symptoms   . Migraine   . Asthma     Past Medical History:  Diagnosis Date  . Adenomyosis   . Asthma    "really well controlled"  . Asthma   . Cystocele   . Fibroid   . High cholesterol   . Menopausal symptoms   . Migraine   . Migraine   . Osteopenia 2017   Resolved.  BMD normal in 2017.  Marland Kitchen PMR (polymyalgia rheumatica) ([place]) 2022  . PONV (postoperative nausea and vomiting)   . Rectocele   . Urinary incontinence   . Uterine prolapse     Past Surgical History:  Procedure Laterality Date  . ANTERIOR LAT LUMBAR FUSION Left 01/27/2020   Procedure: Left Lumbar three-four Lumbar four-five Anterolateral lumbar interbody fusion;  Surgeon: Erline Levine, MD;  Location: [place];  Service:  Neurosurgery;  Laterality: Left;  . APPENDECTOMY    . BACK SURGERY  1999   L4-L5  . BLADDER SUSPENSION    . CATARACT EXTRACTION, BILATERAL  07/2018  . COLONOSCOPY  10/2011  . LUMBAR PERCUTANEOUS PEDICLE SCREW 2 LEVEL N/A 01/27/2020   Procedure: Percutaneous Pedicle Screw Fixation Lumbar three-Lumbar five;  Surgeon: Erline Levine, MD;  Location: [place];  Service: Neurosurgery;  Laterality: N/A;  . NASAL SINUS SURGERY    . TONSILLECTOMY    . VAGINAL HYSTERECTOMY  2011   Vag. Hyst BSO A/P repair-Sling    Current Outpatient Medications  Medication Sig Dispense Refill  . albuterol (VENTOLIN HFA) 108 (90 Base) MCG/ACT inhaler Inhale 2 puffs into the lungs every 4 (four) hours as needed.    Marland Kitchen amitriptyline (ELAVIL) 75 MG tablet     . Biotin 5000 MCG TABS Take 5,000 mcg by mouth daily.     . budesonide-formoterol (SYMBICORT) 160-4.5 MCG/ACT inhaler  Inhale 2 puffs into the lungs every morning.     . Carboxymethylcell-Hypromellose (GENTEAL) 0.25-0.3 % GEL Place 1 drop into both eyes daily.    . clonazePAM (KLONOPIN) 0.5 MG tablet 1 tablet at bedtime.    . Ergocalciferol 2000 units CAPS Take 3,000 Units by mouth daily. (Patient taking differently: Take 2,000 Units by mouth daily.) 30 capsule   . estradiol (VIVELLE-DOT) 0.0375 MG/24HR PLACE 1 PATCH ONTO THE SKIN 2 (TWO) TIMES A WEEK. APPLY ANYWHERE ON LOWER ABDOMEN 24 patch 0  . fexofenadine (ALLEGRA) 180 MG tablet Take 180 mg by mouth daily.    . folic acid (FOLVITE) 1 MG tablet Take 1 mg by mouth daily.    . methotrexate (RHEUMATREX) 2.5 MG tablet Take 15 mg by mouth once a week.    . methylPREDNISolone (MEDROL) 4 MG tablet     . Multiple Vitamin (MULTIVITAMIN WITH MINERALS) TABS tablet Take 1 tablet by mouth daily.    Marland Kitchen omeprazole (PRILOSEC OTC) 20 MG tablet     . omeprazole (PRILOSEC) 10 MG capsule Take 10 mg by mouth daily.     . Probiotic Product (PROBIOTIC DAILY PO) Take 1 tablet by mouth daily.    . rosuvastatin (CRESTOR) 40 MG tablet TAKE  1 TABLET DAILY. KEEP UPCOMING APPT IN DECEMBER WITH DR. ROSS FOR FUTURE REFILLS. THANK YOU 90 tablet 0  . SUMAtriptan (IMITREX) 100 MG tablet TAKE 1 TABLET AS NEEDED FOR MIGRAINE, MAY REPEAT IN 2 HOURS IF HEADACHE PERSISTS OR RECURS 30 tablet 1   No current facility-administered medications for this visit.     ALLERGIES: Hydrocodone-acetaminophen, Penicillins, Meloxicam, Other, Zocor [simvastatin], Codeine, and Tramadol hcl  Family History  Problem Relation Age of Onset  . Diabetes Mother   . Leukemia Mother   . Cancer Mother        leukemia  . Heart disease Father   . Retinitis pigmentosa Father   . Migraines Father   . Healthy Sister   . Healthy Sister   . Healthy Child   . Healthy Child   . Leukemia Brother     Social History   Socioeconomic History  . Marital status: Married    Spouse name: [name]  . Number of children: 2  . Years of education: 85  . Highest education level: Not on file  Occupational History    Employer: [employer]    Comment: [employer]  Tobacco Use  . Smoking status: Former Smoker    Quit date: 05/08/1972    Years since quitting: 48.8  . Smokeless tobacco: Never Used  . Tobacco comment: in college  Vaping Use  . Vaping Use: Never used  Substance and Sexual Activity  . Alcohol use: Yes    Alcohol/week: 2.0 standard drinks    Types: 2 Glasses of wine per week  . Drug use: No  . Sexual activity: Not Currently    Partners: Male    Birth control/protection: Surgical    Comment: hysterectomy  Other Topics Concern  . Not on file  Social History Narrative   Right handed.  Caffeine 2 cups daily, Married, 2 kids, 5 grand kids.    Social Determinants of Health   Financial Resource Strain: Not on file  Food Insecurity: Not on file  Transportation Needs: Not on file  Physical Activity: Not on file  Stress: Not on file  Social Connections: Not on file  Intimate Partner Violence: Not on file    Review of Systems  All other systems reviewed and  are negative.   PHYSICAL EXAMINATION:    BP 138/74   Pulse 75   Ht 5\' 3"  (1.6 m)   Wt 152 lb (68.9 kg)   SpO2 100%   BMI 26.93 kg/m     General appearance: alert, cooperative and appears stated age Head: Normocephalic, without obvious abnormality, atraumatic Neck: no adenopathy, supple, symmetrical, trachea midline and thyroid normal to inspection and palpation Lungs: clear to auscultation bilaterally Breasts: normal appearance, no masses or tenderness, No nipple retraction or dimpling, No nipple discharge or bleeding, No axillary or supraclavicular adenopathy Heart: regular rate and rhythm Abdomen: soft, non-tender, no masses,  no organomegaly Extremities: extremities normal, atraumatic, no cyanosis or edema Skin: Skin color, texture, turgor normal. No rashes or lesions Lymph nodes: Cervical, supraclavicular, and axillary nodes normal. No abnormal inguinal nodes palpated  Pelvic: External genitalia:  no lesions              Urethra:  normal appearing urethra with no masses, tenderness or lesions              Bartholins and Skenes: normal                 Vagina: normal appearing vagina with normal color and discharge, no lesions              Cervix: absent                Bimanual Exam:  Uterus:  absent              Adnexa: no mass, fullness, tenderness              Rectal exam: Yes.  .  Confirms.              Anus:  normal sphincter tone, no lesions  Chaperone was present for exam.  ASSESSMENT  ERT patient.  Status post TVH with BSO and prolapse repair. Hx osteopenia.  FH CAD. Polymyalgia rheumatica. Screening breast exam.  Pelvic exam without abnormal finding.   PLAN  Refill of ERT. Discused WHI and use of HRT which can increase risk of PE, DVT, stroke. Will get a copy of her mammogram from [facility].  She will check her bond density status with her PCP office.  Return yearly and prn.   35 min total time was spent for this patient encounter, including preparation,  face-to-face counseling with the patient, coordination of care, and documentation of the encounter.

## 2021-03-14 NOTE — Patient Instructions (Signed)

## 2021-03-17 ENCOUNTER — Ambulatory Visit: Payer: PPO | Admitting: Dermatology

## 2021-03-17 ENCOUNTER — Encounter: Payer: Self-pay | Admitting: Dermatology

## 2021-03-17 ENCOUNTER — Other Ambulatory Visit: Payer: Self-pay

## 2021-03-17 DIAGNOSIS — L821 Other seborrheic keratosis: Secondary | ICD-10-CM

## 2021-03-17 DIAGNOSIS — L820 Inflamed seborrheic keratosis: Secondary | ICD-10-CM | POA: Diagnosis not present

## 2021-03-21 DIAGNOSIS — K219 Gastro-esophageal reflux disease without esophagitis: Secondary | ICD-10-CM | POA: Diagnosis not present

## 2021-03-21 DIAGNOSIS — G43909 Migraine, unspecified, not intractable, without status migrainosus: Secondary | ICD-10-CM | POA: Diagnosis not present

## 2021-03-21 DIAGNOSIS — E785 Hyperlipidemia, unspecified: Secondary | ICD-10-CM | POA: Diagnosis not present

## 2021-03-21 DIAGNOSIS — M353 Polymyalgia rheumatica: Secondary | ICD-10-CM | POA: Diagnosis not present

## 2021-03-26 ENCOUNTER — Other Ambulatory Visit: Payer: Self-pay | Admitting: Internal Medicine

## 2021-03-28 ENCOUNTER — Encounter: Payer: Self-pay | Admitting: Dermatology

## 2021-03-28 NOTE — Progress Notes (Signed)
   Follow-Up Visit   Subjective  Courtney Horne is a 70 y.o. female who presents for the following: Skin Problem (Patient here today for recheck of lesion under right breast x 1 year. Per patient no change, no bleeding, no pain.  Patient would also like a lesion checked on her left cheek x 1-2 years per patient getting darker no bleeding, no pain.).  Recheck lesion right breast plus change in spot left cheek Location:  Duration:  Quality:  Associated Signs/Symptoms: Modifying Factors:  Severity:  Timing: Context:   Objective  Well appearing patient in no apparent distress; mood and affect are within normal limits. Objective  Right Inframammary Fold: A millimeter brown textured flattopped ovoid papule left inner lower breast.  Small cherry angioma below this.  Objective  Left Malar Cheek: 4 mm pink flattopped papule that better fits inflamed seborrheic keratosis and actinic keratosis.    A focused examination was performed including Head, neck, chest.. Relevant physical exam findings are noted in the Assessment and Plan.   Assessment & Plan    Seborrheic keratosis Right Inframammary Fold  Leave if stable.  Inflamed seborrheic keratosis Left Malar Cheek  Destruction of lesion - Left Malar Cheek Complexity: simple   Destruction method: cryotherapy   Informed consent: discussed and consent obtained   Timeout:  patient name, date of birth, surgical site, and procedure verified Lesion destroyed using liquid nitrogen: Yes   Cryotherapy cycles:  5 Outcome: patient tolerated procedure well with no complications   Post-procedure details: wound care instructions given        I, Lavonna Monarch, MD, have reviewed all documentation for this visit.  The documentation on 03/28/21 for the exam, diagnosis, procedures, and orders are all accurate and complete.

## 2021-04-04 DIAGNOSIS — F309 Manic episode, unspecified: Secondary | ICD-10-CM | POA: Diagnosis not present

## 2021-04-04 DIAGNOSIS — Z6826 Body mass index (BMI) 26.0-26.9, adult: Secondary | ICD-10-CM | POA: Diagnosis not present

## 2021-04-04 DIAGNOSIS — K209 Esophagitis, unspecified without bleeding: Secondary | ICD-10-CM | POA: Diagnosis not present

## 2021-04-04 DIAGNOSIS — M353 Polymyalgia rheumatica: Secondary | ICD-10-CM | POA: Diagnosis not present

## 2021-04-04 DIAGNOSIS — M2550 Pain in unspecified joint: Secondary | ICD-10-CM | POA: Diagnosis not present

## 2021-04-04 DIAGNOSIS — M25559 Pain in unspecified hip: Secondary | ICD-10-CM | POA: Diagnosis not present

## 2021-04-04 DIAGNOSIS — Z7952 Long term (current) use of systemic steroids: Secondary | ICD-10-CM | POA: Diagnosis not present

## 2021-04-04 DIAGNOSIS — E663 Overweight: Secondary | ICD-10-CM | POA: Diagnosis not present

## 2021-04-04 DIAGNOSIS — F19959 Other psychoactive substance use, unspecified with psychoactive substance-induced psychotic disorder, unspecified: Secondary | ICD-10-CM | POA: Diagnosis not present

## 2021-04-04 DIAGNOSIS — D72829 Elevated white blood cell count, unspecified: Secondary | ICD-10-CM | POA: Diagnosis not present

## 2021-04-25 DIAGNOSIS — Z13820 Encounter for screening for osteoporosis: Secondary | ICD-10-CM | POA: Diagnosis not present

## 2021-04-25 DIAGNOSIS — Z780 Asymptomatic menopausal state: Secondary | ICD-10-CM | POA: Diagnosis not present

## 2021-05-13 DIAGNOSIS — K209 Esophagitis, unspecified without bleeding: Secondary | ICD-10-CM | POA: Diagnosis not present

## 2021-05-13 DIAGNOSIS — F19959 Other psychoactive substance use, unspecified with psychoactive substance-induced psychotic disorder, unspecified: Secondary | ICD-10-CM | POA: Diagnosis not present

## 2021-05-13 DIAGNOSIS — F309 Manic episode, unspecified: Secondary | ICD-10-CM | POA: Diagnosis not present

## 2021-05-13 DIAGNOSIS — E663 Overweight: Secondary | ICD-10-CM | POA: Diagnosis not present

## 2021-05-13 DIAGNOSIS — D72829 Elevated white blood cell count, unspecified: Secondary | ICD-10-CM | POA: Diagnosis not present

## 2021-05-13 DIAGNOSIS — Z6826 Body mass index (BMI) 26.0-26.9, adult: Secondary | ICD-10-CM | POA: Diagnosis not present

## 2021-05-13 DIAGNOSIS — Z7952 Long term (current) use of systemic steroids: Secondary | ICD-10-CM | POA: Diagnosis not present

## 2021-05-13 DIAGNOSIS — M353 Polymyalgia rheumatica: Secondary | ICD-10-CM | POA: Diagnosis not present

## 2021-05-13 DIAGNOSIS — M25559 Pain in unspecified hip: Secondary | ICD-10-CM | POA: Diagnosis not present

## 2021-05-30 DIAGNOSIS — F0633 Mood disorder due to known physiological condition with manic features: Secondary | ICD-10-CM | POA: Diagnosis not present

## 2021-06-03 ENCOUNTER — Telehealth: Payer: Self-pay | Admitting: Internal Medicine

## 2021-06-03 DIAGNOSIS — Z8249 Family history of ischemic heart disease and other diseases of the circulatory system: Secondary | ICD-10-CM

## 2021-06-03 DIAGNOSIS — E785 Hyperlipidemia, unspecified: Secondary | ICD-10-CM

## 2021-06-03 DIAGNOSIS — R7989 Other specified abnormal findings of blood chemistry: Secondary | ICD-10-CM

## 2021-06-03 DIAGNOSIS — M8580 Other specified disorders of bone density and structure, unspecified site: Secondary | ICD-10-CM

## 2021-06-03 NOTE — Telephone Encounter (Signed)
Patient is requesting orders to have lab work prior to her appointment on 10/28/21 with Dr. Harrington Challenger.

## 2021-06-15 NOTE — Telephone Encounter (Signed)
Lab orders placed.  Appointment notes updated.  She can get prior to her upcoming office visit with Dr. Harrington Challenger.

## 2021-08-22 DIAGNOSIS — J301 Allergic rhinitis due to pollen: Secondary | ICD-10-CM | POA: Diagnosis not present

## 2021-08-22 DIAGNOSIS — J3089 Other allergic rhinitis: Secondary | ICD-10-CM | POA: Diagnosis not present

## 2021-08-22 DIAGNOSIS — J4520 Mild intermittent asthma, uncomplicated: Secondary | ICD-10-CM | POA: Diagnosis not present

## 2021-08-22 DIAGNOSIS — J3081 Allergic rhinitis due to animal (cat) (dog) hair and dander: Secondary | ICD-10-CM | POA: Diagnosis not present

## 2021-09-20 DIAGNOSIS — U071 COVID-19: Secondary | ICD-10-CM

## 2021-09-20 HISTORY — DX: COVID-19: U07.1

## 2021-10-18 ENCOUNTER — Other Ambulatory Visit: Payer: PPO | Admitting: *Deleted

## 2021-10-18 ENCOUNTER — Other Ambulatory Visit: Payer: Self-pay

## 2021-10-18 DIAGNOSIS — Z8249 Family history of ischemic heart disease and other diseases of the circulatory system: Secondary | ICD-10-CM

## 2021-10-18 DIAGNOSIS — R7989 Other specified abnormal findings of blood chemistry: Secondary | ICD-10-CM

## 2021-10-18 DIAGNOSIS — E785 Hyperlipidemia, unspecified: Secondary | ICD-10-CM | POA: Diagnosis not present

## 2021-10-18 DIAGNOSIS — M8580 Other specified disorders of bone density and structure, unspecified site: Secondary | ICD-10-CM

## 2021-10-18 NOTE — Progress Notes (Signed)
Pt here for labs hemoglobin A1c not ordered per Dr. Harrington Challenger 06/06/21 note.  Order placed for hemoglobin A1c.

## 2021-10-19 LAB — NMR, LIPOPROFILE
Cholesterol, Total: 149 mg/dL (ref 100–199)
HDL Particle Number: 37.6 umol/L (ref >=30.5)
HDL-C: 54 mg/dL (ref >39)
LDL Particle Number: 746 nmol/L (ref <1000)
LDL Size: 20.3 nm — ABNORMAL LOW (ref >20.5)
LDL-C (NIH Calc): 72 mg/dL (ref 0–99)
LP-IR Score: 47 — ABNORMAL HIGH (ref <=45)
Small LDL Particle Number: 440 nmol/L (ref <=527)
Triglycerides: 129 mg/dL (ref 0–149)

## 2021-10-19 LAB — HEPATIC FUNCTION PANEL
ALT: 29 IU/L (ref 0–32)
AST: 28 IU/L (ref 0–40)
Albumin: 4 g/dL (ref 3.7–4.7)
Alkaline Phosphatase: 55 IU/L (ref 44–121)
Bilirubin Total: 0.7 mg/dL (ref 0.0–1.2)
Bilirubin, Direct: 0.2 mg/dL (ref 0.00–0.40)
Total Protein: 6 g/dL (ref 6.0–8.5)

## 2021-10-19 LAB — BASIC METABOLIC PANEL
BUN/Creatinine Ratio: 14 (ref 12–28)
BUN: 12 mg/dL (ref 8–27)
CO2: 24 mmol/L (ref 20–29)
Calcium: 9.1 mg/dL (ref 8.7–10.3)
Chloride: 105 mmol/L (ref 96–106)
Creatinine, Ser: 0.88 mg/dL (ref 0.57–1.00)
Glucose: 94 mg/dL (ref 70–99)
Potassium: 3.6 mmol/L (ref 3.5–5.2)
Sodium: 141 mmol/L (ref 134–144)
eGFR: 70 mL/min/{1.73_m2} (ref >59)

## 2021-10-19 LAB — HEMOGLOBIN A1C
Est. average glucose Bld gHb Est-mCnc: 128 mg/dL
Hgb A1c MFr Bld: 6.1 % — ABNORMAL HIGH (ref 4.8–5.6)

## 2021-10-19 LAB — LIPOPROTEIN A (LPA): Lipoprotein (a): 12 nmol/L (ref <75.0)

## 2021-10-19 LAB — CBC
Hematocrit: 38.8 % (ref 34.0–46.6)
Hemoglobin: 12.8 g/dL (ref 11.1–15.9)
MCH: 29.8 pg (ref 26.6–33.0)
MCHC: 33 g/dL (ref 31.5–35.7)
MCV: 90 fL (ref 79–97)
Platelets: 231 10*3/uL (ref 150–450)
RBC: 4.3 x10E6/uL (ref 3.77–5.28)
RDW: 14.5 % (ref 11.7–15.4)
WBC: 8.2 10*3/uL (ref 3.4–10.8)

## 2021-10-19 LAB — VITAMIN D 25 HYDROXY (VIT D DEFICIENCY, FRACTURES): Vit D, 25-Hydroxy: 47.2 ng/mL (ref 30.0–100.0)

## 2021-10-19 LAB — TSH: TSH: 5.65 u[IU]/mL — ABNORMAL HIGH (ref 0.450–4.500)

## 2021-10-19 LAB — APOLIPOPROTEIN B: Apolipoprotein B: 64 mg/dL (ref <90)

## 2021-10-24 DIAGNOSIS — M5412 Radiculopathy, cervical region: Secondary | ICD-10-CM | POA: Diagnosis not present

## 2021-10-24 DIAGNOSIS — R200 Anesthesia of skin: Secondary | ICD-10-CM | POA: Diagnosis not present

## 2021-10-28 ENCOUNTER — Other Ambulatory Visit: Payer: Self-pay

## 2021-10-28 ENCOUNTER — Encounter: Payer: Self-pay | Admitting: Internal Medicine

## 2021-10-28 ENCOUNTER — Ambulatory Visit: Payer: PPO | Admitting: Internal Medicine

## 2021-10-28 VITALS — BP 124/70 | HR 62 | Ht 63.0 in | Wt 141.0 lb

## 2021-10-28 DIAGNOSIS — E785 Hyperlipidemia, unspecified: Secondary | ICD-10-CM

## 2021-10-28 DIAGNOSIS — Z8249 Family history of ischemic heart disease and other diseases of the circulatory system: Secondary | ICD-10-CM

## 2021-10-28 DIAGNOSIS — R7989 Other specified abnormal findings of blood chemistry: Secondary | ICD-10-CM | POA: Diagnosis not present

## 2021-10-28 MED ORDER — ROSUVASTATIN CALCIUM 40 MG PO TABS: 40.0000 mg | ORAL_TABLET | Freq: Every day | ORAL | 2 refills | Status: DC

## 2021-10-28 NOTE — Progress Notes (Signed)
Cardiology Office Note   Date:  10/28/2021   ID:  Courtney Horne, DOB 05-13-1951, MRN [id number]  PCP:  Courtney Infante, MD  Cardiologist:   Courtney Carnes, MD    F/U of HL     History of Present Illness: Courtney Horne is a 70 y.o. female with a history of HL, mild elevation of LFTs  Myovue in 2010 was normal  LVEF normal     CT for  calcium score done in 2019.  This showed a score of 18 which was 55th percentile for age and sex.  Calcium was noted in the distal left main and mid LAD.  Plan was to continue on medical therapy since  she was asymptomatic. In September 2021 the pt developed problems after a COVID booster   Placed on steroids   She has been treated since for PMR  The pt developed COVID 3 wks ago  REcovered  The pt denies CP   Breathing is OK   No dizziness   Weight is down 10 lbs  Feeling better    Current Meds  Medication Sig   albuterol (VENTOLIN HFA) 108 (90 Base) MCG/ACT inhaler Inhale 2 puffs into the lungs every 4 (four) hours as needed.   amitriptyline (ELAVIL) 75 MG tablet 50 mg.   Biotin 5000 MCG TABS Take 5,000 mcg by mouth daily.    budesonide-formoterol (SYMBICORT) 160-4.5 MCG/ACT inhaler Inhale 2 puffs into the lungs every morning.    Carboxymethylcell-Hypromellose (GENTEAL) 0.25-0.3 % GEL Place 1 drop into both eyes daily.   Ergocalciferol 2000 units CAPS Take 3,000 Units by mouth daily. (Patient taking differently: Take 2,000 Units by mouth daily.)   estradiol (VIVELLE-DOT) 0.0375 MG/24HR PLACE 1 PATCH ONTO THE SKIN 2 (TWO) TIMES A WEEK. APPLY ANYWHERE ON LOWER ABDOMEN   fexofenadine (ALLEGRA) 180 MG tablet Take 180 mg by mouth daily.   folic acid (FOLVITE) 1 MG tablet Take 2 mg by mouth daily.   methotrexate (RHEUMATREX) 2.5 MG tablet Take 15 mg by mouth once a week.   methylPREDNISolone (MEDROL) 4 MG tablet    Multiple Vitamin (MULTIVITAMIN WITH MINERALS) TABS tablet Take 1 tablet by mouth daily.   omeprazole (PRILOSEC) 10 MG capsule Take 10 mg by  mouth daily.    ondansetron (ZOFRAN) 4 MG tablet take 1-2 tablet by oral route  every 8 hours prn nausea   Probiotic Product (PROBIOTIC DAILY PO) Take 1 tablet by mouth daily.   rosuvastatin (CRESTOR) 40 MG tablet TAKE 1 TABLET BY MOUTH EVERY DAY   SUMAtriptan (IMITREX) 100 MG tablet TAKE 1 TABLET AS NEEDED FOR MIGRAINE, MAY REPEAT IN 2 HOURS IF HEADACHE PERSISTS OR RECURS     Allergies:   Hydrocodone-acetaminophen, Penicillins, Meloxicam, Other, Zocor [simvastatin], Codeine, and Tramadol hcl   Past Medical History:  Diagnosis Date   Adenomyosis    Asthma    "really well controlled"   Asthma    Cystocele    Fibroid    High cholesterol    Menopausal symptoms    Migraine    Migraine    Osteopenia 2017   Resolved.  BMD normal in 2017.   PMR (polymyalgia rheumatica) (HCC) 2022   PONV (postoperative nausea and vomiting)    Rectocele    Urinary incontinence    Uterine prolapse     Past Surgical History:  Procedure Laterality Date   ANTERIOR LAT LUMBAR FUSION Left 01/27/2020   Procedure: Left Lumbar three-four Lumbar four-five Anterolateral lumbar interbody fusion;  Surgeon:  Erline Levine, MD;  Location: [facility];  Service: Neurosurgery;  Laterality: Left;   APPENDECTOMY     BACK SURGERY  1999   L4-L5   BLADDER SUSPENSION     CATARACT EXTRACTION, BILATERAL  07/2018   COLONOSCOPY  10/2011   LUMBAR PERCUTANEOUS PEDICLE SCREW 2 LEVEL N/A 01/27/2020   Procedure: Percutaneous Pedicle Screw Fixation Lumbar three-Lumbar five;  Surgeon: Erline Levine, MD;  Location: [facility];  Service: Neurosurgery;  Laterality: N/A;   NASAL SINUS SURGERY     TONSILLECTOMY     VAGINAL HYSTERECTOMY  2011   Vag. Hyst BSO A/P repair-Sling     Social History:  The patient  reports that she quit smoking about 49 years ago. She has never used smokeless tobacco. She reports current alcohol use of about 2.0 standard drinks per week. She reports that she does not use drugs.   Family History:  The patient's family  history includes Cancer in her mother; Diabetes in her mother; Healthy in her child, child, sister, and sister; Heart disease in her father; Leukemia in her brother and mother; Migraines in her father; Retinitis pigmentosa in her father.    ROS:  Please see the history of present illness. All other systems are reviewed and  Negative to the above problem except as noted.    PHYSICAL EXAM: VS:  BP 124/70   Pulse 62   Ht 5\' 3"  (1.6 m)   Wt 141 lb (64 kg)   SpO2 99%   BMI 24.98 kg/m   GEN: Well nourished, well developed, in no acute distress  HEENT: normal  Neck: JVP not elevated.  No  carotid bruits,  Cardiac: RRR; no murmurs;,no lower extremity edema  Respiratory:  clear to auscultation bilaterally,  GI: soft, nontender, nondistended, + BS  No hepatomegaly  MS: no deformity Moving all extremities patient with obvious discomfort when she moves her upper arms to get her vest on Skin: warm and dry, no rash Neuro:  Strength and sensation are intact Psych: euthymic mood, full affect   EKG:  EKG is ordered today.  Sinus rhythm 62 bpm nonspecific ST changes  Lipid Panel    Component Value Date/Time   CHOL 160 05/11/2017 0742   CHOL 184 01/23/2017 0925   TRIG 65 05/11/2017 0742   HDL 73 05/11/2017 0742   CHOLHDL 3.1 01/23/2017 0925   CHOLHDL 2.7 01/10/2016 0934   VLDL 19 01/10/2016 0934   LDLCALC 104 (H) 01/23/2017 0925      Wt Readings from Last 3 Encounters:  10/28/21 141 lb (64 kg)  03/14/21 152 lb (68.9 kg)  11/01/20 148 lb (67.1 kg)      ASSESSMENT AND PLAN:  1  HL Pt's lipomed panel is very good   Reviewed with her   COntinue  2  CAD   Ca score 18  Control risk factors  3 Elevated A1C   Discussed A1C    Limit carbs and sugars   4  PMR   ON medrol   SLow taper    Plan for follow-up in 1 year.   Current medicines are reviewed at length with the patient today.  The patient does not have concerns regarding medicines.  Signed, Courtney Carnes, MD  10/28/2021 9:53  AM    [address], [place], [place]  [address] Phone: [phone]; Fax: [phone]

## 2021-10-28 NOTE — Patient Instructions (Signed)
Medication Instructions:  Your physician recommends that you continue on your current medications as directed. Please refer to the Current Medication list given to you today.  *If you need a refill on your cardiac medications before your next appointment, please call your pharmacy*   Lab Work: none If you have labs (blood work) drawn today and your tests are completely normal, you will receive your results only by: MyChart Message (if you have MyChart) OR A paper copy in the mail If you have any lab test that is abnormal or we need to change your treatment, we will call you to review the results.   Testing/Procedures: none   Follow-Up: At [facility], you and your health needs are our priority.  As part of our continuing mission to provide you with exceptional heart care, we have created designated Provider Care Teams.  These Care Teams include your primary Cardiologist (physician) and Advanced Practice Providers (APPs -  Physician Assistants and Nurse Practitioners) who all work together to provide you with the care you need, when you need it.  We recommend signing up for the patient portal called "MyChart".  Sign up information is provided on this After Visit Summary.  MyChart is used to connect with patients for Virtual Visits (Telemedicine).  Patients are able to view lab/test results, encounter notes, upcoming appointments, etc.  Non-urgent messages can be sent to your provider as well.   To learn more about what you can do with MyChart, go to [email or web address].    

## 2021-11-01 DIAGNOSIS — R200 Anesthesia of skin: Secondary | ICD-10-CM | POA: Diagnosis not present

## 2021-11-01 DIAGNOSIS — R202 Paresthesia of skin: Secondary | ICD-10-CM | POA: Diagnosis not present

## 2021-11-01 DIAGNOSIS — M5412 Radiculopathy, cervical region: Secondary | ICD-10-CM | POA: Diagnosis not present

## 2021-11-02 ENCOUNTER — Ambulatory Visit: Payer: PPO | Admitting: Adult Health

## 2021-11-02 DIAGNOSIS — E785 Hyperlipidemia, unspecified: Secondary | ICD-10-CM | POA: Diagnosis not present

## 2021-11-03 DIAGNOSIS — F19959 Other psychoactive substance use, unspecified with psychoactive substance-induced psychotic disorder, unspecified: Secondary | ICD-10-CM | POA: Diagnosis not present

## 2021-11-03 DIAGNOSIS — K209 Esophagitis, unspecified without bleeding: Secondary | ICD-10-CM | POA: Diagnosis not present

## 2021-11-03 DIAGNOSIS — Z7952 Long term (current) use of systemic steroids: Secondary | ICD-10-CM | POA: Diagnosis not present

## 2021-11-03 DIAGNOSIS — E039 Hypothyroidism, unspecified: Secondary | ICD-10-CM | POA: Diagnosis not present

## 2021-11-03 DIAGNOSIS — M353 Polymyalgia rheumatica: Secondary | ICD-10-CM | POA: Diagnosis not present

## 2021-11-03 DIAGNOSIS — Z6824 Body mass index (BMI) 24.0-24.9, adult: Secondary | ICD-10-CM | POA: Diagnosis not present

## 2021-11-07 DIAGNOSIS — M353 Polymyalgia rheumatica: Secondary | ICD-10-CM | POA: Diagnosis not present

## 2021-11-07 DIAGNOSIS — H6121 Impacted cerumen, right ear: Secondary | ICD-10-CM | POA: Diagnosis not present

## 2021-11-07 DIAGNOSIS — Z806 Family history of leukemia: Secondary | ICD-10-CM | POA: Diagnosis not present

## 2021-11-07 DIAGNOSIS — E785 Hyperlipidemia, unspecified: Secondary | ICD-10-CM | POA: Diagnosis not present

## 2021-11-07 DIAGNOSIS — Z1331 Encounter for screening for depression: Secondary | ICD-10-CM | POA: Diagnosis not present

## 2021-11-07 DIAGNOSIS — J45909 Unspecified asthma, uncomplicated: Secondary | ICD-10-CM | POA: Diagnosis not present

## 2021-11-07 DIAGNOSIS — R7301 Impaired fasting glucose: Secondary | ICD-10-CM | POA: Diagnosis not present

## 2021-11-07 DIAGNOSIS — Z780 Asymptomatic menopausal state: Secondary | ICD-10-CM | POA: Diagnosis not present

## 2021-11-07 DIAGNOSIS — Z1339 Encounter for screening examination for other mental health and behavioral disorders: Secondary | ICD-10-CM | POA: Diagnosis not present

## 2021-11-07 DIAGNOSIS — E038 Other specified hypothyroidism: Secondary | ICD-10-CM | POA: Diagnosis not present

## 2021-11-07 DIAGNOSIS — Z0000 Encounter for general adult medical examination without abnormal findings: Secondary | ICD-10-CM | POA: Diagnosis not present

## 2021-11-07 DIAGNOSIS — Z8051 Family history of malignant neoplasm of kidney: Secondary | ICD-10-CM | POA: Diagnosis not present

## 2021-11-16 DIAGNOSIS — M47812 Spondylosis without myelopathy or radiculopathy, cervical region: Secondary | ICD-10-CM | POA: Diagnosis not present

## 2021-11-18 DIAGNOSIS — E039 Hypothyroidism, unspecified: Secondary | ICD-10-CM | POA: Diagnosis not present

## 2021-11-18 DIAGNOSIS — E785 Hyperlipidemia, unspecified: Secondary | ICD-10-CM | POA: Diagnosis not present

## 2021-11-18 DIAGNOSIS — J45909 Unspecified asthma, uncomplicated: Secondary | ICD-10-CM | POA: Diagnosis not present

## 2021-12-15 ENCOUNTER — Encounter: Payer: Self-pay | Admitting: Adult Health

## 2021-12-15 ENCOUNTER — Ambulatory Visit: Payer: PPO | Admitting: Adult Health

## 2021-12-15 VITALS — Ht 63.0 in | Wt 139.8 lb

## 2021-12-15 DIAGNOSIS — G43001 Migraine without aura, not intractable, with status migrainosus: Secondary | ICD-10-CM

## 2021-12-15 NOTE — Progress Notes (Signed)
.meg    PATIENT: Courtney Horne DOB: 1951-10-21  REASON FOR VISIT: follow up HISTORY FROM: patient  HISTORY OF PRESENT ILLNESS: Today 12/15/21: Courtney Horne is a 71 year old female with a history of migraine headaches.  She returns today for follow-up.  She reports her headaches continue to be managed well with amitriptyline.  She only has approximately 1 headache a month.  Sometimes is 1 headache every other month.  Imitrex continues to resolve her headache fairly quickly.  She returns today for evaluation.  11/01/20: Courtney Horne is a 71 year old female with a history migraine headaches. She returns today for follow-up.  She states overall she has been doing well with her headaches.  She has approximately 1-2 headaches a month.  She continues to use amitriptyline and Imitrex.  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  She states that patient did not start until after she got the Covid booster.  She has had x-rays with her orthopedist that was unremarkable.  Her sedimentation rate was elevated and she has a referral to rheumatology.  She returns today for an evaluation.   10/28/19: Courtney Horne is a 72 year old female with a history of migraine headaches.  She returns today for follow-up.  She states that her headaches have been under relatively good control.  She may have approximately 3 headaches a month.  She states that her headaches are typically triggered by alcohol or weather.  She states that Imitrex continues to work well for her.  She states that she golfs 3 times a week.  If it is too cold to golf she will go walking.  She also does puzzles.  She states that she has been taking amitriptyline 75 mg for approximately 10 years.  She states that it is worked well for her migraines and her sleep.  She has noticed recently she has been waking up at least once at night and has difficulty going back to  sleep.  She is questioning whether amitriptyline could be increased to 100 mg.  She denies any issues with constipation, urinary retention, blurred vision or dry mouth.  I reviewed recent EKG completed November 2020.   HISTORY 10/09/18:   Courtney Horne is a 71 year old female with a history of migraine.  She returns today for follow-up.  She reports that her headaches are relatively good control.  She has approximately 1 headache a week.  Her headaches tend to occur on the left side of the face.  She denies photophobia or phonophobia.  She states that she takes Imitrex her headache typically resolves in 15 to 30 minutes.  She notes to that with amitriptyline she was sleeping well but now she is waking up 2-3 times at night.  She returns today for evaluation.  REVIEW OF SYSTEMS: Out of a complete 14 system review of symptoms, the patient complains only of the following symptoms, and all other reviewed systems are negative.  See HPI  ALLERGIES: Allergies  Allergen Reactions   Hydrocodone-Acetaminophen Nausea Only   Penicillins Itching and Rash    Did it involve swelling of the face/tongue/throat, SOB, or low BP? No Did it involve sudden or severe rash/hives, skin peeling, or any reaction on the inside of your mouth or nose? No Did you need to seek medical attention at a hospital or doctor's office? No When did it last happen?   15 years ago   If all  above answers are "NO", may proceed with cephalosporin use.    Meloxicam Itching and Other (See Comments)   Other Other (See Comments)   Zocor [Simvastatin] Other (See Comments)    UNSPECIFIED REACTION    Codeine Nausea And Vomiting   Tramadol Hcl Itching    HOME MEDICATIONS: Outpatient Medications Prior to Visit  Medication Sig Dispense Refill   albuterol (VENTOLIN HFA) 108 (90 Base) MCG/ACT inhaler Inhale 2 puffs into the lungs every 4 (four) hours as needed.     amitriptyline (ELAVIL) 75 MG tablet 50 mg.     Biotin 5000 MCG TABS Take  5,000 mcg by mouth daily.      budesonide-formoterol (SYMBICORT) 160-4.5 MCG/ACT inhaler Inhale 2 puffs into the lungs every morning.      Carboxymethylcell-Hypromellose (GENTEAL) 0.25-0.3 % GEL Place 1 drop into both eyes daily.     Ergocalciferol 2000 units CAPS Take 3,000 Units by mouth daily. (Patient taking differently: Take 2,000 Units by mouth daily.) 30 capsule    estradiol (VIVELLE-DOT) 0.0375 MG/24HR PLACE 1 PATCH ONTO THE SKIN 2 (TWO) TIMES A WEEK. APPLY ANYWHERE ON LOWER ABDOMEN 24 patch 3   fexofenadine (ALLEGRA) 180 MG tablet Take 180 mg by mouth daily.     folic acid (FOLVITE) 1 MG tablet Take 2 mg by mouth daily.     methotrexate (RHEUMATREX) 2.5 MG tablet Take 15 mg by mouth once a week.     methylPREDNISolone (MEDROL) 4 MG tablet      Multiple Vitamin (MULTIVITAMIN WITH MINERALS) TABS tablet Take 1 tablet by mouth daily.     omeprazole (PRILOSEC) 10 MG capsule Take 10 mg by mouth daily.      ondansetron (ZOFRAN) 4 MG tablet take 1-2 tablet by oral route  every 8 hours prn nausea     Probiotic Product (PROBIOTIC DAILY PO) Take 1 tablet by mouth daily.     rosuvastatin (CRESTOR) 40 MG tablet Take 1 tablet (40 mg total) by mouth daily. 90 tablet 2   SUMAtriptan (IMITREX) 100 MG tablet TAKE 1 TABLET AS NEEDED FOR MIGRAINE, MAY REPEAT IN 2 HOURS IF HEADACHE PERSISTS OR RECURS 30 tablet 1   clonazePAM (KLONOPIN) 0.5 MG tablet 1 tablet at bedtime. (Patient not taking: Reported on 10/28/2021)     No facility-administered medications prior to visit.    PAST MEDICAL HISTORY: Past Medical History:  Diagnosis Date   Adenomyosis    Asthma    "really well controlled"   Asthma    Cystocele    Fibroid    High cholesterol    Menopausal symptoms    Migraine    Migraine    Osteopenia 2017   Resolved.  BMD normal in 2017.   PMR (polymyalgia rheumatica) (HCC) 2022   PONV (postoperative nausea and vomiting)    Rectocele    Urinary incontinence    Uterine prolapse     PAST  SURGICAL HISTORY: Past Surgical History:  Procedure Laterality Date   ANTERIOR LAT LUMBAR FUSION Left 01/27/2020   Procedure: Left Lumbar three-four Lumbar four-five Anterolateral lumbar interbody fusion;  Surgeon: Erline Levine, MD;  Location: [facility];  Service: Neurosurgery;  Laterality: Left;   APPENDECTOMY     BACK SURGERY  1999   L4-L5   BLADDER SUSPENSION     CATARACT EXTRACTION, BILATERAL  07/2018   COLONOSCOPY  10/2011   LUMBAR PERCUTANEOUS PEDICLE SCREW 2 LEVEL N/A 01/27/2020   Procedure: Percutaneous Pedicle Screw Fixation Lumbar three-Lumbar five;  Surgeon: Erline Levine, MD;  Location: [facility];  Service: Neurosurgery;  Laterality: N/A;   NASAL SINUS SURGERY     TONSILLECTOMY     VAGINAL HYSTERECTOMY  2011   Vag. Hyst BSO A/P repair-Sling    FAMILY HISTORY: Family History  Problem Relation Age of Onset   Diabetes Mother    Leukemia Mother    Cancer Mother        leukemia   Heart disease Father    Retinitis pigmentosa Father    Migraines Father    Healthy Sister    Healthy Sister    Healthy Child    Healthy Child    Leukemia Brother     SOCIAL HISTORY: Social History   Socioeconomic History   Marital status: Married    Spouse name: [name]   Number of children: 2   Years of education: 16   Highest education level: Not on file  Occupational History    Employer: [employer]    Comment: [employer]  Tobacco Use   Smoking status: Former    Types: Cigarettes    Quit date: 05/08/1972    Years since quitting: 49.6   Smokeless tobacco: Never   Tobacco comments:    in college  Vaping Use   Vaping Use: Never used  Substance and Sexual Activity   Alcohol use: Yes    Alcohol/week: 2.0 standard drinks    Types: 2 Glasses of wine per week   Drug use: No   Sexual activity: Not Currently    Partners: Male    Birth control/protection: Surgical    Comment: hysterectomy  Other Topics Concern   Not on file  Social History Narrative   Right handed.  Caffeine 2 cups daily,  Married, 2 kids, 5 grand kids.    Social Determinants of Health   Financial Resource Strain: Not on file  Food Insecurity: Not on file  Transportation Needs: Not on file  Physical Activity: Not on file  Stress: Not on file  Social Connections: Not on file  Intimate Partner Violence: Not on file      PHYSICAL EXAM  Vitals:   12/15/21 1134  Weight: 139 lb 12.8 oz (63.4 kg)  Height: 5\' 3"  (1.6 m)    Body mass index is 24.76 kg/m.  Generalized: Well developed, in no acute distress   Neurological examination  Mentation: Alert oriented to time, place, history taking. Follows all commands speech and language fluent Cranial nerve II-XII: Pupils were equal round reactive to light. Extraocular movements were full, visual field were full on confrontational test. . Head turning and shoulder shrug  were normal and symmetric. Motor: The motor testing reveals 5 over 5 strength of all 4 extremities. Good symmetric motor tone is noted throughout.  Sensory: Sensory testing is intact to soft touch on all 4 extremities. No evidence of extinction is noted.  Coordination: Cerebellar testing reveals good finger-nose-finger and heel-to-shin bilaterally.  Gait and station: Gait is normal.  Reflexes: Deep tendon reflexes are symmetric and normal bilaterally.   DIAGNOSTIC DATA (LABS, IMAGING, TESTING) - I reviewed patient records, labs, notes, testing and imaging myself where available.  Lab Results  Component Value Date   WBC 8.2 10/18/2021   HGB 12.8 10/18/2021   HCT 38.8 10/18/2021   MCV 90 10/18/2021   PLT 231 10/18/2021      Component Value Date/Time   NA 141 10/18/2021 0849   K 3.6 10/18/2021 0849   CL 105 10/18/2021 0849   CO2 24 10/18/2021 0849   GLUCOSE 94 10/18/2021  0849   GLUCOSE 107 (H) 01/23/2020 1206   BUN 12 10/18/2021 0849   CREATININE 0.88 10/18/2021 0849   CREATININE 0.92 01/10/2016 0934   CALCIUM 9.1 10/18/2021 0849   PROT 6.0 10/18/2021 0849   ALBUMIN 4.0  10/18/2021 0849   AST 28 10/18/2021 0849   ALT 29 10/18/2021 0849   ALKPHOS 55 10/18/2021 0849   BILITOT 0.7 10/18/2021 0849   GFRNONAA 65 10/06/2020 0827   GFRAA 74 10/06/2020 0827   Lab Results  Component Value Date   CHOL 160 05/11/2017   HDL 73 05/11/2017   LDLCALC 104 (H) 01/23/2017   TRIG 65 05/11/2017   CHOLHDL 3.1 01/23/2017   Lab Results  Component Value Date   HGBA1C 6.1 (H) 10/18/2021    Lab Results  Component Value Date   TSH 5.650 (H) 10/18/2021      ASSESSMENT AND PLAN 71 y.o. year old female  has a past medical history of Adenomyosis, Asthma, Asthma, Cystocele, Fibroid, High cholesterol, Menopausal symptoms, Migraine, Migraine, Osteopenia (2017), PMR (polymyalgia rheumatica) ([facility]) (2022), PONV (postoperative nausea and vomiting), Rectocele, Urinary incontinence, and Uterine prolapse. here with :  1.  Migraine headaches   -- Continue Amitriptyline 100mg  at bedtime -- Continue Imitrex for abortive therapy -- advised to call if symptoms worsen or develop new symptoms -- Patient's migraines have been stable for quite some time.  She will follow-up with our office on an as-needed basis.  Any future refills can come from her PCP.  I spent 25 minutes of face-to-face and non-face-to-face time with patient.  This included previsit chart review, lab review, study review, order entry, electronic health record documentation, patient education.    Ward Givens, MSN, NP-C 12/15/2021, 11:34 AM [facility] [address] [place], [place] [address] [phone]

## 2022-01-03 DIAGNOSIS — H524 Presbyopia: Secondary | ICD-10-CM | POA: Diagnosis not present

## 2022-01-03 DIAGNOSIS — H52203 Unspecified astigmatism, bilateral: Secondary | ICD-10-CM | POA: Diagnosis not present

## 2022-01-03 DIAGNOSIS — Z961 Presence of intraocular lens: Secondary | ICD-10-CM | POA: Diagnosis not present

## 2022-01-04 DIAGNOSIS — Z1231 Encounter for screening mammogram for malignant neoplasm of breast: Secondary | ICD-10-CM | POA: Diagnosis not present

## 2022-01-05 ENCOUNTER — Encounter: Payer: Self-pay | Admitting: Obstetrics and Gynecology

## 2022-02-01 ENCOUNTER — Other Ambulatory Visit: Payer: Self-pay | Admitting: Adult Health

## 2022-02-13 DIAGNOSIS — Z6824 Body mass index (BMI) 24.0-24.9, adult: Secondary | ICD-10-CM | POA: Diagnosis not present

## 2022-02-13 DIAGNOSIS — Z7952 Long term (current) use of systemic steroids: Secondary | ICD-10-CM | POA: Diagnosis not present

## 2022-02-13 DIAGNOSIS — Z79899 Other long term (current) drug therapy: Secondary | ICD-10-CM | POA: Diagnosis not present

## 2022-02-13 DIAGNOSIS — F19959 Other psychoactive substance use, unspecified with psychoactive substance-induced psychotic disorder, unspecified: Secondary | ICD-10-CM | POA: Diagnosis not present

## 2022-02-13 DIAGNOSIS — K209 Esophagitis, unspecified without bleeding: Secondary | ICD-10-CM | POA: Diagnosis not present

## 2022-02-13 DIAGNOSIS — M353 Polymyalgia rheumatica: Secondary | ICD-10-CM | POA: Diagnosis not present

## 2022-02-17 DIAGNOSIS — E038 Other specified hypothyroidism: Secondary | ICD-10-CM | POA: Diagnosis not present

## 2022-02-17 DIAGNOSIS — J45909 Unspecified asthma, uncomplicated: Secondary | ICD-10-CM | POA: Diagnosis not present

## 2022-02-17 DIAGNOSIS — E785 Hyperlipidemia, unspecified: Secondary | ICD-10-CM | POA: Diagnosis not present

## 2022-03-13 DIAGNOSIS — J301 Allergic rhinitis due to pollen: Secondary | ICD-10-CM | POA: Diagnosis not present

## 2022-03-13 DIAGNOSIS — J3089 Other allergic rhinitis: Secondary | ICD-10-CM | POA: Diagnosis not present

## 2022-03-13 DIAGNOSIS — J4520 Mild intermittent asthma, uncomplicated: Secondary | ICD-10-CM | POA: Diagnosis not present

## 2022-03-13 DIAGNOSIS — J3081 Allergic rhinitis due to animal (cat) (dog) hair and dander: Secondary | ICD-10-CM | POA: Diagnosis not present

## 2022-03-13 DIAGNOSIS — J069 Acute upper respiratory infection, unspecified: Secondary | ICD-10-CM | POA: Diagnosis not present

## 2022-03-17 DIAGNOSIS — M8588 Other specified disorders of bone density and structure, other site: Secondary | ICD-10-CM | POA: Diagnosis not present

## 2022-03-20 NOTE — Progress Notes (Signed)
71 y.o. [id number] Married Caucasian female here for office visit.  ? ?She is followed for estrogen therapy and has medical concerns to discuss today.  ? ?She started this for night sweats.  ?She prefers to continue this.  ?Dealing with vaginal dryness. ? ?2 daughters tested positive for BRCA2.  ?Patient's husband tested positive for BRCA2 also, and he has hx prostate cancer. ?Patient tested negative for BRCA2.  ? ?Off steroids to treat her PMR.  ?Taking MTX.  She is trying to wean off of this.  ?She is noticing change in her skin. ? ?Recent treatment for bronchitis.  ? ?PCP:  Crist Infante, MD ? ?No LMP recorded. Patient has had a hysterectomy.     ?  ?    ?Sexually active: No.  ?The current method of family planning is status post hysterectomy.    ?Exercising: Yes.     Pickle ball, gym, golf ?Smoker:  Former ? ?Health Maintenance: ?Pap:  2011 normal ?History of abnormal Pap:  no ?MMG:  01-05-22 Neg/Birads1 ?Colonoscopy: 2019 normal;10 years ?BMD:   2017  Result :Normal ?TDaP:  09-27-16 ?Gardasil:   n/a ?HIV: 08-21-16 NR ?Hep C: 08-21-16 Neg ?Screening Labs:  PCP. ? ? reports that she quit smoking about 49 years ago. Her smoking use included cigarettes. She has never used smokeless tobacco. She reports current alcohol use of about 2.0 standard drinks per week. She reports that she does not use drugs. ? ?Past Medical History:  ?Diagnosis Date  ? Adenomyosis   ? Asthma   ? "really well controlled"  ? Asthma   ? BRCA gene mutation negative   ? COVID-19 09/2021  ? had headache/nausea/ runny nose.  ? Cystocele   ? Fibroid   ? High cholesterol   ? Menopausal symptoms   ? Migraine   ? Migraine   ? Osteopenia 2017  ? Resolved.  BMD normal in 2017.  ? PMR (polymyalgia rheumatica) ([place]) 2022  ? PONV (postoperative nausea and vomiting)   ? Rectocele   ? Urinary incontinence   ? Uterine prolapse   ? ? ?Past Surgical History:  ?Procedure Laterality Date  ? ANTERIOR LAT LUMBAR FUSION Left 01/27/2020  ? Procedure: Left Lumbar three-four  Lumbar four-five Anterolateral lumbar interbody fusion;  Surgeon: Erline Levine, MD;  Location: [facility];  Service: Neurosurgery;  Laterality: Left;  ? APPENDECTOMY    ? BACK SURGERY  1999  ? L4-L5  ? BLADDER SUSPENSION    ? CATARACT EXTRACTION, BILATERAL  07/2018  ? COLONOSCOPY  10/2011  ? LUMBAR PERCUTANEOUS PEDICLE SCREW 2 LEVEL N/A 01/27/2020  ? Procedure: Percutaneous Pedicle Screw Fixation Lumbar three-Lumbar five;  Surgeon: Erline Levine, MD;  Location: [facility];  Service: Neurosurgery;  Laterality: N/A;  ? NASAL SINUS SURGERY    ? TONSILLECTOMY    ? VAGINAL HYSTERECTOMY  2011  ? Vag. Hyst BSO A/P repair-Sling  ? ? ?Current Outpatient Medications  ?Medication Sig Dispense Refill  ? methotrexate (RHEUMATREX) 2.5 MG tablet See admin instructions.    ? albuterol (VENTOLIN HFA) 108 (90 Base) MCG/ACT inhaler Inhale 2 puffs into the lungs every 4 (four) hours as needed.    ? amitriptyline (ELAVIL) 50 MG tablet TAKE 2 TABLETS BY MOUTH AT BEDTIME. 180 tablet 2  ? Biotin 5000 MCG TABS Take 5,000 mcg by mouth daily.     ? budesonide-formoterol (SYMBICORT) 160-4.5 MCG/ACT inhaler Inhale 2 puffs into the lungs every morning.     ? Carboxymethylcell-Hypromellose (GENTEAL) 0.25-0.3 % GEL Place 1 drop  into both eyes daily.    ? Ergocalciferol 2000 units CAPS Take 3,000 Units by mouth daily. (Patient taking differently: Take 2,000 Units by mouth daily.) 30 capsule   ? estradiol (VIVELLE-DOT) 0.0375 MG/24HR PLACE 1 PATCH ONTO THE SKIN 2 (TWO) TIMES A WEEK. APPLY ANYWHERE ON LOWER ABDOMEN 24 patch 3  ? fexofenadine (ALLEGRA) 180 MG tablet Take 180 mg by mouth daily.    ? folic acid (FOLVITE) 1 MG tablet Take 1 mg by mouth daily.    ? Multiple Vitamin (MULTIVITAMIN WITH MINERALS) TABS tablet Take 1 tablet by mouth daily.    ? omeprazole (PRILOSEC) 10 MG capsule Take 10 mg by mouth daily.     ? ondansetron (ZOFRAN) 4 MG tablet take 1-2 tablet by oral route  every 8 hours prn nausea    ? Probiotic Product (PROBIOTIC DAILY PO) Take 1  tablet by mouth daily.    ? rosuvastatin (CRESTOR) 40 MG tablet 1 tablet    ? SUMAtriptan (IMITREX) 100 MG tablet TAKE 1 TABLET AS NEEDED FOR MIGRAINE, MAY REPEAT IN 2 HOURS IF HEADACHE PERSISTS OR RECURS 30 tablet 1  ? ?No current facility-administered medications for this visit.  ? ? ?Family History  ?Problem Relation Age of Onset  ? Diabetes Mother   ? Leukemia Mother   ? Cancer Mother   ?     leukemia  ? Heart disease Father   ? Retinitis pigmentosa Father   ? Migraines Father   ? Healthy Sister   ? Healthy Sister   ? Healthy Child   ? Healthy Child   ? Leukemia Brother   ? ? ?Review of Systems  ?All other systems reviewed and are negative. ? ?Exam:   ?BP 138/72   Pulse 61   Ht 5' 2.5" (1.588 m)   Wt 135 lb (61.2 kg)   SpO2 98%   BMI 24.30 kg/m?     ?General appearance: alert, cooperative and appears stated age ?Head: normocephalic, without obvious abnormality, atraumatic ?Neck: no adenopathy, supple, symmetrical, trachea midline and thyroid normal to inspection and palpation ?Lungs: clear to auscultation bilaterally ?Breasts: normal appearance, no masses or tenderness, No nipple retraction or dimpling, No nipple discharge or bleeding, No axillary adenopathy ?Heart: regular rate and rhythm ?Abdomen: soft, non-tender; no masses, no organomegaly ?Extremities: extremities normal, atraumatic, no cyanosis or edema ?Skin: skin color, texture, turgor normal. No rashes or lesions ?Lymph nodes: cervical, supraclavicular, and axillary nodes normal. ?Neurologic: grossly normal ? ?Pelvic: External genitalia:  no lesions ?             No abnormal inguinal nodes palpated. ?             Urethra:  normal appearing urethra with no masses, tenderness or lesions ?             Bartholins and Skenes: normal    ?             Vagina: normal appearing vagina with normal color and discharge, no lesions ?             Cervix: absent ?             Pap taken: No. ?Bimanual Exam:  Uterus:  absent ?             Adnexa: no mass, fullness,  tenderness ?             Rectal exam: yes.  Confirms. ?  Anus:  normal sphincter tone, no lesions ? ?Chaperone was present for exam:  yes ? ?Assessment:   ?Well woman visit with gynecologic exam. ?ERT patient.  ?Status post TVH with BSO and prolapse repair. ?Hx osteopenia.  ?FH CAD. ?Polymyalgia rheumatica. ?FH BRCA2.  Patient tested negative.  ? ?Plan: ?Mammogram screening discussed. ?Self breast awareness reviewed. ?Pap and HR HPV as above. ?Guidelines for Calcium, Vitamin D, regular exercise program including cardiovascular and weight bearing exercise. ?Refill of Vivelle Dot.   We discussed increased risk stroke, DVT, PE, and possible breast cancer with estrogen use .  She wishes to continue this treatment.  ?We discussed BRCA testing.  ?Follow up annually and prn. ? ?After visit summary provided.  ? ?30 min  total time was spent for this patient encounter, including preparation, face-to-face counseling with the patient, coordination of care, and documentation of the encounter. ? ? ? ? ?

## 2022-03-21 ENCOUNTER — Encounter: Payer: Self-pay | Admitting: Obstetrics and Gynecology

## 2022-03-22 ENCOUNTER — Ambulatory Visit (INDEPENDENT_AMBULATORY_CARE_PROVIDER_SITE_OTHER): Payer: PPO | Admitting: Obstetrics and Gynecology

## 2022-03-22 ENCOUNTER — Encounter: Payer: Self-pay | Admitting: Obstetrics and Gynecology

## 2022-03-22 VITALS — BP 138/72 | HR 61 | Ht 62.5 in | Wt 135.0 lb

## 2022-03-22 DIAGNOSIS — Z8481 Family history of carrier of genetic disease: Secondary | ICD-10-CM | POA: Diagnosis not present

## 2022-03-22 DIAGNOSIS — Z79899 Other long term (current) drug therapy: Secondary | ICD-10-CM

## 2022-03-22 DIAGNOSIS — Z01419 Encounter for gynecological examination (general) (routine) without abnormal findings: Secondary | ICD-10-CM | POA: Diagnosis not present

## 2022-03-22 MED ORDER — ESTRADIOL 0.0375 MG/24HR TD PTTW: MEDICATED_PATCH | TRANSDERMAL | 3 refills | Status: DC

## 2022-03-22 NOTE — Patient Instructions (Signed)

## 2022-05-10 ENCOUNTER — Encounter: Payer: Self-pay | Admitting: Obstetrics and Gynecology

## 2022-05-10 DIAGNOSIS — R634 Abnormal weight loss: Secondary | ICD-10-CM | POA: Diagnosis not present

## 2022-05-10 DIAGNOSIS — M353 Polymyalgia rheumatica: Secondary | ICD-10-CM | POA: Diagnosis not present

## 2022-07-13 ENCOUNTER — Other Ambulatory Visit: Payer: Self-pay | Admitting: Internal Medicine

## 2022-08-16 DIAGNOSIS — F19959 Other psychoactive substance use, unspecified with psychoactive substance-induced psychotic disorder, unspecified: Secondary | ICD-10-CM | POA: Diagnosis not present

## 2022-08-16 DIAGNOSIS — M1991 Primary osteoarthritis, unspecified site: Secondary | ICD-10-CM | POA: Diagnosis not present

## 2022-08-16 DIAGNOSIS — Z7952 Long term (current) use of systemic steroids: Secondary | ICD-10-CM | POA: Diagnosis not present

## 2022-08-16 DIAGNOSIS — K209 Esophagitis, unspecified without bleeding: Secondary | ICD-10-CM | POA: Diagnosis not present

## 2022-08-16 DIAGNOSIS — Z6822 Body mass index (BMI) 22.0-22.9, adult: Secondary | ICD-10-CM | POA: Diagnosis not present

## 2022-08-16 DIAGNOSIS — M353 Polymyalgia rheumatica: Secondary | ICD-10-CM | POA: Diagnosis not present

## 2022-08-16 DIAGNOSIS — Z79899 Other long term (current) drug therapy: Secondary | ICD-10-CM | POA: Diagnosis not present

## 2022-08-28 DIAGNOSIS — J3081 Allergic rhinitis due to animal (cat) (dog) hair and dander: Secondary | ICD-10-CM | POA: Diagnosis not present

## 2022-08-28 DIAGNOSIS — J3089 Other allergic rhinitis: Secondary | ICD-10-CM | POA: Diagnosis not present

## 2022-08-28 DIAGNOSIS — J301 Allergic rhinitis due to pollen: Secondary | ICD-10-CM | POA: Diagnosis not present

## 2022-08-28 DIAGNOSIS — J4520 Mild intermittent asthma, uncomplicated: Secondary | ICD-10-CM | POA: Diagnosis not present

## 2022-10-20 ENCOUNTER — Telehealth: Payer: Self-pay | Admitting: Internal Medicine

## 2022-10-20 DIAGNOSIS — R7989 Other specified abnormal findings of blood chemistry: Secondary | ICD-10-CM

## 2022-10-20 DIAGNOSIS — E785 Hyperlipidemia, unspecified: Secondary | ICD-10-CM

## 2022-10-20 DIAGNOSIS — Z8249 Family history of ischemic heart disease and other diseases of the circulatory system: Secondary | ICD-10-CM

## 2022-10-20 DIAGNOSIS — Z79899 Other long term (current) drug therapy: Secondary | ICD-10-CM

## 2022-10-20 DIAGNOSIS — M8580 Other specified disorders of bone density and structure, unspecified site: Secondary | ICD-10-CM

## 2022-10-20 NOTE — Telephone Encounter (Signed)
Pt has appt with Dr Harrington Challenger 2/2/214 and last labs were 09/2021.   Will forward to Dr Harrington Challenger to see if she would like to have labs done ahead of time per the pt request.

## 2022-10-20 NOTE — Telephone Encounter (Signed)
Patient would like to get lab orders to have blood drawn prior to her annual appointment.

## 2022-10-23 NOTE — Telephone Encounter (Signed)
Yes, check the same labs as she had done in Nov 2022

## 2022-10-24 NOTE — Telephone Encounter (Signed)
I spoke with the pt and moved up her Feb appt to 11/22/2022.

## 2022-10-24 NOTE — Telephone Encounter (Signed)
Pt is returning call. Transferred to Lelon Frohlich, Therapist, sports.

## 2022-10-24 NOTE — Telephone Encounter (Signed)
Spoke with the pt and will have labs 10/25/22 per her request.

## 2022-10-24 NOTE — Telephone Encounter (Signed)
Pt is returning call. Requesting call back.  

## 2022-10-24 NOTE — Telephone Encounter (Signed)
LMTCB  Pt can get labs prior to her appt with Dr Harrington Challenger... need lab date.   Labs ordered.

## 2022-10-25 ENCOUNTER — Ambulatory Visit: Payer: PPO | Attending: Internal Medicine

## 2022-10-25 DIAGNOSIS — R7989 Other specified abnormal findings of blood chemistry: Secondary | ICD-10-CM | POA: Diagnosis not present

## 2022-10-25 DIAGNOSIS — M8580 Other specified disorders of bone density and structure, unspecified site: Secondary | ICD-10-CM | POA: Diagnosis not present

## 2022-10-25 DIAGNOSIS — Z8249 Family history of ischemic heart disease and other diseases of the circulatory system: Secondary | ICD-10-CM

## 2022-10-25 DIAGNOSIS — E785 Hyperlipidemia, unspecified: Secondary | ICD-10-CM | POA: Diagnosis not present

## 2022-10-25 DIAGNOSIS — Z79899 Other long term (current) drug therapy: Secondary | ICD-10-CM | POA: Diagnosis not present

## 2022-10-27 LAB — HEPATIC FUNCTION PANEL
ALT: 38 IU/L — ABNORMAL HIGH (ref 0–32)
AST: 37 IU/L (ref 0–40)
Albumin: 4.4 g/dL (ref 3.8–4.8)
Alkaline Phosphatase: 71 IU/L (ref 44–121)
Bilirubin Total: 0.9 mg/dL (ref 0.0–1.2)
Bilirubin, Direct: 0.24 mg/dL (ref 0.00–0.40)
Total Protein: 6.6 g/dL (ref 6.0–8.5)

## 2022-10-27 LAB — CBC
Hematocrit: 42.8 % (ref 34.0–46.6)
Hemoglobin: 13.8 g/dL (ref 11.1–15.9)
MCH: 29.6 pg (ref 26.6–33.0)
MCHC: 32.2 g/dL (ref 31.5–35.7)
MCV: 92 fL (ref 79–97)
Platelets: 199 10*3/uL (ref 150–450)
RBC: 4.67 x10E6/uL (ref 3.77–5.28)
RDW: 13.5 % (ref 11.7–15.4)
WBC: 6.6 10*3/uL (ref 3.4–10.8)

## 2022-10-27 LAB — BASIC METABOLIC PANEL
BUN/Creatinine Ratio: 17 (ref 12–28)
BUN: 15 mg/dL (ref 8–27)
CO2: 24 mmol/L (ref 20–29)
Calcium: 9.9 mg/dL (ref 8.7–10.3)
Chloride: 104 mmol/L (ref 96–106)
Creatinine, Ser: 0.87 mg/dL (ref 0.57–1.00)
Glucose: 87 mg/dL (ref 70–99)
Potassium: 3.8 mmol/L (ref 3.5–5.2)
Sodium: 141 mmol/L (ref 134–144)
eGFR: 70 mL/min/{1.73_m2} (ref >59)

## 2022-10-27 LAB — NMR, LIPOPROFILE
Cholesterol, Total: 156 mg/dL (ref 100–199)
HDL Particle Number: 38.4 umol/L (ref >=30.5)
HDL-C: 57 mg/dL (ref >39)
LDL Particle Number: 990 nmol/L (ref <1000)
LDL Size: 20.3 nm — ABNORMAL LOW (ref >20.5)
LDL-C (NIH Calc): 80 mg/dL (ref 0–99)
LP-IR Score: 39 (ref <=45)
Small LDL Particle Number: 694 nmol/L — ABNORMAL HIGH (ref <=527)
Triglycerides: 104 mg/dL (ref 0–149)

## 2022-10-27 LAB — APOLIPOPROTEIN B: Apolipoprotein B: 67 mg/dL (ref <90)

## 2022-10-27 LAB — LIPOPROTEIN A (LPA): Lipoprotein (a): 10.3 nmol/L (ref <75.0)

## 2022-10-27 LAB — TSH: TSH: 5.46 u[IU]/mL — ABNORMAL HIGH (ref 0.450–4.500)

## 2022-10-27 LAB — VITAMIN D 25 HYDROXY (VIT D DEFICIENCY, FRACTURES): Vit D, 25-Hydroxy: 77.5 ng/mL (ref 30.0–100.0)

## 2022-10-27 LAB — HEMOGLOBIN A1C
Est. average glucose Bld gHb Est-mCnc: 117 mg/dL
Hgb A1c MFr Bld: 5.7 % — ABNORMAL HIGH (ref 4.8–5.6)

## 2022-11-02 ENCOUNTER — Other Ambulatory Visit: Payer: Self-pay | Admitting: Adult Health

## 2022-11-02 DIAGNOSIS — G43001 Migraine without aura, not intractable, with status migrainosus: Secondary | ICD-10-CM

## 2022-11-17 DIAGNOSIS — J3089 Other allergic rhinitis: Secondary | ICD-10-CM | POA: Diagnosis not present

## 2022-11-17 DIAGNOSIS — J301 Allergic rhinitis due to pollen: Secondary | ICD-10-CM | POA: Diagnosis not present

## 2022-11-17 DIAGNOSIS — J3081 Allergic rhinitis due to animal (cat) (dog) hair and dander: Secondary | ICD-10-CM | POA: Diagnosis not present

## 2022-11-17 DIAGNOSIS — J4520 Mild intermittent asthma, uncomplicated: Secondary | ICD-10-CM | POA: Diagnosis not present

## 2022-11-21 NOTE — Progress Notes (Unsigned)
Cardiology Office Note   Date:  11/22/2022   ID:  Courtney Horne, DOB 1951/05/14, MRN [id number]  PCP:  Crist Infante, MD  Cardiologist:   Dorris Carnes, MD    F/U of HL     History of Present Illness: Courtney Horne is a 72 y.o. female with a history of HL, mild elevation of LFTs  Myovue in 2010 was normal  LVEF normal    Calcium score in 2019 was 18 (55th percentile).  Calcium was noted in the distal left main and mid LAD. In September 2021 the pt developed problems after a COVID booster   Placed on steroids   She has been treated since for PMR  I saw the pt in Dec 2022  Since seen the pt denies CP   Breathing is OK  Pt is active   No dizziness Current Meds  Medication Sig   albuterol (VENTOLIN HFA) 108 (90 Base) MCG/ACT inhaler Inhale 2 puffs into the lungs every 4 (four) hours as needed.   amitriptyline (ELAVIL) 50 MG tablet TAKE 2 TABLETS BY MOUTH AT BEDTIME   Biotin 5000 MCG TABS Take 5,000 mcg by mouth daily.    budesonide-formoterol (SYMBICORT) 160-4.5 MCG/ACT inhaler Inhale 2 puffs into the lungs every morning.    Carboxymethylcell-Hypromellose (GENTEAL) 0.25-0.3 % GEL Place 1 drop into both eyes daily.   Ergocalciferol 2000 units CAPS Take 3,000 Units by mouth daily.   estradiol (VIVELLE-DOT) 0.0375 MG/24HR PLACE 1 PATCH ONTO THE SKIN 2 (TWO) TIMES A WEEK. APPLY ANYWHERE ON LOWER ABDOMEN   fexofenadine (ALLEGRA) 180 MG tablet Take 180 mg by mouth daily.   folic acid (FOLVITE) 1 MG tablet Take 1 mg by mouth daily.   methotrexate (RHEUMATREX) 2.5 MG tablet See admin instructions.   Multiple Vitamin (MULTIVITAMIN WITH MINERALS) TABS tablet Take 1 tablet by mouth daily.   Probiotic Product (PROBIOTIC DAILY PO) Take 1 tablet by mouth daily.   rosuvastatin (CRESTOR) 40 MG tablet TAKE 1 TABLET BY MOUTH EVERY DAY   SUMAtriptan (IMITREX) 100 MG tablet TAKE 1 TABLET AS NEEDED FOR MIGRAINE, MAY REPEAT IN 2 HOURS IF HEADACHE PERSISTS OR RECURS     Allergies:    Hydrocodone-acetaminophen, Penicillins, Meloxicam, Other, Peanut (diagnostic), Prednisone, Zocor [simvastatin], Codeine, and Tramadol hcl   Past Medical History:  Diagnosis Date   Adenomyosis    Asthma    "really well controlled"   Asthma    BRCA gene mutation negative    COVID-19 09/2021   had headache/nausea/ runny nose.   Cystocele    Fibroid    High cholesterol    Menopausal symptoms    Migraine    Migraine    Osteopenia 2017   Resolved.  BMD normal in 2017.   PMR (polymyalgia rheumatica) ([facility]) 2022   PONV (postoperative nausea and vomiting)    Rectocele    Urinary incontinence    Uterine prolapse     Past Surgical History:  Procedure Laterality Date   ANTERIOR LAT LUMBAR FUSION Left 01/27/2020   Procedure: Left Lumbar three-four Lumbar four-five Anterolateral lumbar interbody fusion;  Surgeon: Erline Levine, MD;  Location: [facility];  Service: Neurosurgery;  Laterality: Left;   APPENDECTOMY     BACK SURGERY  1999   L4-L5   BLADDER SUSPENSION     CATARACT EXTRACTION, BILATERAL  07/2018   COLONOSCOPY  10/2011   LUMBAR PERCUTANEOUS PEDICLE SCREW 2 LEVEL N/A 01/27/2020   Procedure: Percutaneous Pedicle Screw Fixation Lumbar three-Lumbar five;  Surgeon: Vertell Limber,  Broadus John, MD;  Location: [facility];  Service: Neurosurgery;  Laterality: N/A;   NASAL SINUS SURGERY     TONSILLECTOMY     VAGINAL HYSTERECTOMY  2011   Vag. Hyst BSO A/P repair-Sling     Social History:  The patient  reports that she quit smoking about 50 years ago. Her smoking use included cigarettes. She has never used smokeless tobacco. She reports current alcohol use of about 2.0 standard drinks of alcohol per week. She reports that she does not use drugs.   Family History:  The patient's family history includes Cancer in her mother; Diabetes in her mother; Healthy in her child, child, sister, and sister; Heart disease in her father; Leukemia in her brother and mother; Migraines in her father; Retinitis pigmentosa in her  father.    ROS:  Please see the history of present illness. All other systems are reviewed and  Negative to the above problem except as noted.    PHYSICAL EXAM: VS:  BP 132/80   Pulse (!) 54   Ht 5' 2.5" (1.588 m)   Wt 125 lb 3.2 oz (56.8 kg)   SpO2 97%   BMI 22.53 kg/m   GEN: Well nourished, well developed, in no acute distress  HEENT: normal  Neck: JVP not elevated.  No  carotid bruit,  Cardiac: RRR; no murmurs;,no LE edema  Respiratory:  clear to auscultation bilaterally,  GI: soft, nontender, nondistended, + BS  No hepatomegaly  MS: no deformity  Skin: warm and dry, no rash Neuro:  Strength and sensation are intact Psych: euthymic mood, full affect   EKG:  EKG is ordered today.  SB  54 bpm  Lipid Panel    Component Value Date/Time   CHOL 160 05/11/2017 0742   CHOL 184 01/23/2017 0925   TRIG 65 05/11/2017 0742   HDL 73 05/11/2017 0742   CHOLHDL 3.1 01/23/2017 0925   CHOLHDL 2.7 01/10/2016 0934   VLDL 19 01/10/2016 0934   LDLCALC 104 (H) 01/23/2017 0925      Wt Readings from Last 3 Encounters:  11/22/22 125 lb 3.2 oz (56.8 kg)  03/22/22 135 lb (61.2 kg)  12/15/21 139 lb 12.8 oz (63.4 kg)      ASSESSMENT AND PLAN:  1  HL LDL 80 with particles below 1000   Her labs were better last year   Discussed diet   I would work on this and keep meds same    2  CAD   Ca score 18  Control risk factors  No symptoms   3 Metabolics   F0Y 5.7   Discussed diet         Plan for follow-up in 1 year.   Current medicines are reviewed at length with the patient today.  The patient does not have concerns regarding medicines.  Signed, Dorris Carnes, MD  11/22/2022 2:40 PM    [facility] [address], [place], [place]  [address] Phone: [phone]; Fax: [phone]

## 2022-11-22 ENCOUNTER — Encounter: Payer: Self-pay | Admitting: Internal Medicine

## 2022-11-22 ENCOUNTER — Ambulatory Visit: Payer: PPO | Attending: Internal Medicine | Admitting: Internal Medicine

## 2022-11-22 VITALS — BP 132/80 | HR 54 | Ht 62.5 in | Wt 125.2 lb

## 2022-11-22 DIAGNOSIS — E785 Hyperlipidemia, unspecified: Secondary | ICD-10-CM | POA: Diagnosis not present

## 2022-11-22 DIAGNOSIS — M353 Polymyalgia rheumatica: Secondary | ICD-10-CM | POA: Diagnosis not present

## 2022-11-22 NOTE — Patient Instructions (Signed)
Medication Instructions:  Your physician recommends that you continue on your current medications as directed. Please refer to the Current Medication list given to you today.  *If you need a refill on your cardiac medications before your next appointment, please call your pharmacy*  Lab Work: Prior to your next office visit - Lipomed, TSH, T4 If you have labs (blood work) drawn today and your tests are completely normal, you will receive your results only by: [facility] (if you have MyChart) OR A paper copy in the mail If you have any lab test that is abnormal or we need to change your treatment, we will call you to review the results.  Follow-Up: At [facility], you and your health needs are our priority.  As part of our continuing mission to provide you with exceptional heart care, we have created designated Provider Care Teams.  These Care Teams include your primary Cardiologist (physician) and Advanced Practice Providers (APPs -  Physician Assistants and Nurse Practitioners) who all work together to provide you with the care you need, when you need it.  Your next appointment:   1 year(s)  The format for your next appointment:   In Person  Provider:   Dorris Carnes, MD     Important Information About Sugar

## 2022-12-22 ENCOUNTER — Ambulatory Visit: Payer: PPO | Admitting: Internal Medicine

## 2023-01-10 ENCOUNTER — Other Ambulatory Visit: Payer: Self-pay | Admitting: Internal Medicine

## 2023-01-11 ENCOUNTER — Other Ambulatory Visit: Payer: Self-pay | Admitting: Adult Health

## 2023-01-15 ENCOUNTER — Encounter: Payer: Self-pay | Admitting: *Deleted

## 2023-01-16 MED ORDER — SUMATRIPTAN SUCCINATE 100 MG PO TABS: ORAL_TABLET | ORAL | 0 refills | Status: DC

## 2023-01-19 DIAGNOSIS — Z79899 Other long term (current) drug therapy: Secondary | ICD-10-CM | POA: Diagnosis not present

## 2023-01-19 DIAGNOSIS — Z7952 Long term (current) use of systemic steroids: Secondary | ICD-10-CM | POA: Diagnosis not present

## 2023-01-19 DIAGNOSIS — Z6821 Body mass index (BMI) 21.0-21.9, adult: Secondary | ICD-10-CM | POA: Diagnosis not present

## 2023-01-19 DIAGNOSIS — M1991 Primary osteoarthritis, unspecified site: Secondary | ICD-10-CM | POA: Diagnosis not present

## 2023-01-19 DIAGNOSIS — H04123 Dry eye syndrome of bilateral lacrimal glands: Secondary | ICD-10-CM | POA: Diagnosis not present

## 2023-01-19 DIAGNOSIS — F19959 Other psychoactive substance use, unspecified with psychoactive substance-induced psychotic disorder, unspecified: Secondary | ICD-10-CM | POA: Diagnosis not present

## 2023-01-19 DIAGNOSIS — M353 Polymyalgia rheumatica: Secondary | ICD-10-CM | POA: Diagnosis not present

## 2023-01-19 DIAGNOSIS — K209 Esophagitis, unspecified without bleeding: Secondary | ICD-10-CM | POA: Diagnosis not present

## 2023-01-23 DIAGNOSIS — E113292 Type 2 diabetes mellitus with mild nonproliferative diabetic retinopathy without macular edema, left eye: Secondary | ICD-10-CM | POA: Diagnosis not present

## 2023-02-15 ENCOUNTER — Encounter: Payer: Self-pay | Admitting: Adult Health

## 2023-02-15 ENCOUNTER — Ambulatory Visit: Payer: PPO | Admitting: Adult Health

## 2023-02-15 DIAGNOSIS — G43001 Migraine without aura, not intractable, with status migrainosus: Secondary | ICD-10-CM | POA: Diagnosis not present

## 2023-02-15 MED ORDER — SUMATRIPTAN SUCCINATE 100 MG PO TABS: ORAL_TABLET | ORAL | 11 refills | Status: DC

## 2023-02-15 MED ORDER — AMITRIPTYLINE HCL 50 MG PO TABS: 75.0000 mg | ORAL_TABLET | Freq: Every day | ORAL | 1 refills | Status: DC

## 2023-02-15 NOTE — Progress Notes (Signed)
.meg    PATIENT: Courtney Horne DOB: 05-15-1951  REASON FOR VISIT: follow up HISTORY FROM: patient Chief Complaint  Patient presents with   Follow-up    Pt in 19 Pt here for Migraine f/u  Pt states having increased migraines  Pt states she thinks its due to weather change  Pt states having increased sinus pressure      HISTORY OF PRESENT ILLNESS: Today 02/15/23:  Courtney Horne is a 72 y.o. female with a history of migraine headaches. Returns today for follow-up. She has been taking amitriptyline 75 mg at bedtime. Reports that headaches are weather related. Reports she did have a severe migraine over the weekend. She states that if she bends over she feels pressure in the forehead. Not sure if this is related to her sinuses.  She states that she has been hesitant to take Imitrex as soon as she feels her symptoms because she is concerned it may be a sinus headache versus a true migraine.  Patient also reports that she has seen her ophthalmologist and her retinal specialist due to having some trouble seeing road signs that are along distance away.  She states that the exams have been relatively unremarkable she sees the retinal specialist again in 3 months    12/15/21: Courtney Horne is a 72 year old female with a history of migraine headaches.  She returns today for follow-up.  She reports her headaches continue to be managed well with amitriptyline.  She only has approximately 1 headache a month.  Sometimes is 1 headache every other month.  Imitrex continues to resolve her headache fairly quickly.  She returns today for evaluation.  11/01/20: Courtney Horne is a 72 year old female with a history migraine headaches. She returns today for follow-up.  She states overall she has been doing well with her headaches.  She has approximately 1-2 headaches a month.  She continues to use amitriptyline and Imitrex.  She states that she has been having issues with frozen shoulder possibly due to Covid  booster-she reports that the CDC is following her.  She states that patient did not start until after she got the Covid booster.  She has had x-rays with her orthopedist that was unremarkable.  Her sedimentation rate was elevated and she has a referral to rheumatology.  She returns today for an evaluation.   10/28/19: Courtney Horne is a 72 year old female with a history of migraine headaches.  She returns today for follow-up.  She states that her headaches have been under relatively good control.  She may have approximately 3 headaches a month.  She states that her headaches are typically triggered by alcohol or weather.  She states that Imitrex continues to work well for her.  She states that she golfs 3 times a week.  If it is too cold to golf she will go walking.  She also does puzzles.  She states that she has been taking amitriptyline 75 mg for approximately 10 years.  She states that it is worked well for her migraines and her sleep.  She has noticed recently she has been waking up at least once at night and has difficulty going back to sleep.  She is questioning whether amitriptyline could be increased to 100 mg.  She denies any issues with constipation, urinary retention, blurred vision or dry mouth.  I reviewed recent EKG completed November 2020.   HISTORY 10/09/18:   Courtney Horne is a 72 year old female with a history of migraine.  She returns  today for follow-up.  She reports that her headaches are relatively good control.  She has approximately 1 headache a week.  Her headaches tend to occur on the left side of the face.  She denies photophobia or phonophobia.  She states that she takes Imitrex her headache typically resolves in 15 to 30 minutes.  She notes to that with amitriptyline she was sleeping well but now she is waking up 2-3 times at night.  She returns today for evaluation.  REVIEW OF SYSTEMS: Out of a complete 14 system review of symptoms, the patient complains only of the following  symptoms, and all other reviewed systems are negative.  See HPI  ALLERGIES: Allergies  Allergen Reactions   Hydrocodone-Acetaminophen Nausea Only   Penicillins Itching and Rash    Did it involve swelling of the face/tongue/throat, SOB, or low BP? No Did it involve sudden or severe rash/hives, skin peeling, or any reaction on the inside of your mouth or nose? No Did you need to seek medical attention at a hospital or doctor's office? No When did it last happen?   15 years ago   If all above answers are "NO", may proceed with cephalosporin use.    Meloxicam Itching and Other (See Comments)   Other Other (See Comments)   Peanut (Diagnostic) Other (See Comments)   Prednisone Other (See Comments)   Zocor [Simvastatin] Other (See Comments)    UNSPECIFIED REACTION    Codeine Nausea And Vomiting and Other (See Comments)   Tramadol Hcl Itching    HOME MEDICATIONS: Outpatient Medications Prior to Visit  Medication Sig Dispense Refill   albuterol (VENTOLIN HFA) 108 (90 Base) MCG/ACT inhaler Inhale 2 puffs into the lungs every 4 (four) hours as needed.     amitriptyline (ELAVIL) 50 MG tablet TAKE 2 TABLETS BY MOUTH AT BEDTIME 180 tablet 0   B Complex Vitamins (VITAMIN B COMPLEX PO) Take by mouth.     Biotin 5000 MCG TABS Take 5,000 mcg by mouth daily.      budesonide-formoterol (SYMBICORT) 160-4.5 MCG/ACT inhaler Inhale 2 puffs into the lungs every morning.      Carboxymethylcell-Hypromellose (GENTEAL) 0.25-0.3 % GEL Place 1 drop into both eyes daily.     Ergocalciferol 2000 units CAPS Take 3,000 Units by mouth daily. 30 capsule    estradiol (VIVELLE-DOT) 0.0375 MG/24HR PLACE 1 PATCH ONTO THE SKIN 2 (TWO) TIMES A WEEK. APPLY ANYWHERE ON LOWER ABDOMEN 24 patch 3   fexofenadine (ALLEGRA) 180 MG tablet Take 180 mg by mouth daily.     folic acid (FOLVITE) 1 MG tablet Take 1 mg by mouth daily.     methotrexate (RHEUMATREX) 2.5 MG tablet 7.5 mg See admin instructions.     Multiple Vitamin  (MULTIVITAMIN WITH MINERALS) TABS tablet Take 1 tablet by mouth daily.     Probiotic Product (PROBIOTIC DAILY PO) Take 1 tablet by mouth daily.     rosuvastatin (CRESTOR) 40 MG tablet TAKE 1 TABLET BY MOUTH EVERY DAY 90 tablet 1   SUMAtriptan (IMITREX) 100 MG tablet Take 1 tablet at the onset of migraine.May repeat in 2 hours if headache persists or recurs. Only 2 tabs/24 hours 15 tablet 0   No facility-administered medications prior to visit.    PAST MEDICAL HISTORY: Past Medical History:  Diagnosis Date   Adenomyosis    Asthma    "really well controlled"   Asthma    BRCA gene mutation negative    COVID-19 09/2021   had headache/nausea/ runny nose.  Cystocele    Fibroid    High cholesterol    Menopausal symptoms    Migraine    Migraine    Osteopenia 2017   Resolved.  BMD normal in 2017.   PMR (polymyalgia rheumatica) (HCC) 2022   PONV (postoperative nausea and vomiting)    Rectocele    Urinary incontinence    Uterine prolapse     PAST SURGICAL HISTORY: Past Surgical History:  Procedure Laterality Date   ANTERIOR LAT LUMBAR FUSION Left 01/27/2020   Procedure: Left Lumbar three-four Lumbar four-five Anterolateral lumbar interbody fusion;  Surgeon: Erline Levine, MD;  Location: [facility];  Service: Neurosurgery;  Laterality: Left;   APPENDECTOMY     BACK SURGERY  1999   L4-L5   BLADDER SUSPENSION     CATARACT EXTRACTION, BILATERAL  07/2018   COLONOSCOPY  10/2011   LUMBAR PERCUTANEOUS PEDICLE SCREW 2 LEVEL N/A 01/27/2020   Procedure: Percutaneous Pedicle Screw Fixation Lumbar three-Lumbar five;  Surgeon: Erline Levine, MD;  Location: [facility];  Service: Neurosurgery;  Laterality: N/A;   NASAL SINUS SURGERY     TONSILLECTOMY     VAGINAL HYSTERECTOMY  2011   Vag. Hyst BSO A/P repair-Sling    FAMILY HISTORY: Family History  Problem Relation Age of Onset   Diabetes Mother    Leukemia Mother    Cancer Mother        leukemia   Heart disease Father    Retinitis pigmentosa  Father    Migraines Father    Healthy Sister    Healthy Sister    Leukemia Brother    Migraines Paternal Uncle    Healthy Child    Healthy Child     SOCIAL HISTORY: Social History   Socioeconomic History   Marital status: Married    Spouse name: [name]   Number of children: 2   Years of education: 16   Highest education level: Not on file  Occupational History    Employer: [employer]    Comment: [employer]  Tobacco Use   Smoking status: Former    Types: Cigarettes    Quit date: 05/08/1972    Years since quitting: 50.8   Smokeless tobacco: Never   Tobacco comments:    in college  Vaping Use   Vaping Use: Never used  Substance and Sexual Activity   Alcohol use: Yes    Alcohol/week: 2.0 standard drinks of alcohol    Types: 2 Glasses of wine per week   Drug use: No   Sexual activity: Not Currently    Partners: Male    Birth control/protection: Surgical    Comment: hysterectomy  Other Topics Concern   Not on file  Social History Narrative   Right handed.  Caffeine 2 cups daily, Married, 2 kids, 5 grand kids.    Social Determinants of Health   Financial Resource Strain: Not on file  Food Insecurity: Not on file  Transportation Needs: Not on file  Physical Activity: Not on file  Stress: Not on file  Social Connections: Not on file  Intimate Partner Violence: Not on file      PHYSICAL EXAM  Vitals:   02/15/23 0928  BP: 124/60  Pulse: (!) 54  Weight: 123 lb 6.4 oz (56 kg)  Height: 5\' 3"  (1.6 m)    Body mass index is 21.86 kg/m.  Generalized: Well developed, in no acute distress   Neurological examination  Mentation: Alert oriented to time, place, history taking. Follows all commands speech and language fluent Cranial  nerve II-XII: Pupils were equal round reactive to light. Extraocular movements were full, visual field were full on confrontational test. . Head turning and shoulder shrug  were normal and symmetric. Motor: The motor testing reveals 5 over 5  strength of all 4 extremities. Good symmetric motor tone is noted throughout.  Sensory: Sensory testing is intact to soft touch on all 4 extremities. No evidence of extinction is noted.  Coordination: Cerebellar testing reveals good finger-nose-finger and heel-to-shin bilaterally.  Gait and station: Gait is normal.    DIAGNOSTIC DATA (LABS, IMAGING, TESTING) - I reviewed patient records, labs, notes, testing and imaging myself where available.  Lab Results  Component Value Date   WBC 6.6 10/25/2022   HGB 13.8 10/25/2022   HCT 42.8 10/25/2022   MCV 92 10/25/2022   PLT 199 10/25/2022      Component Value Date/Time   NA 141 10/25/2022 0940   K 3.8 10/25/2022 0940   CL 104 10/25/2022 0940   CO2 24 10/25/2022 0940   GLUCOSE 87 10/25/2022 0940   GLUCOSE 107 (H) 01/23/2020 1206   BUN 15 10/25/2022 0940   CREATININE 0.87 10/25/2022 0940   CREATININE 0.92 01/10/2016 0934   CALCIUM 9.9 10/25/2022 0940   PROT 6.6 10/25/2022 0940   ALBUMIN 4.4 10/25/2022 0940   AST 37 10/25/2022 0940   ALT 38 (H) 10/25/2022 0940   ALKPHOS 71 10/25/2022 0940   BILITOT 0.9 10/25/2022 0940   GFRNONAA 65 10/06/2020 0827   GFRAA 74 10/06/2020 0827   Lab Results  Component Value Date   CHOL 160 05/11/2017   HDL 73 05/11/2017   LDLCALC 104 (H) 01/23/2017   TRIG 65 05/11/2017   CHOLHDL 3.1 01/23/2017   Lab Results  Component Value Date   HGBA1C 5.7 (H) 10/25/2022    Lab Results  Component Value Date   TSH 5.460 (H) 10/25/2022      ASSESSMENT AND PLAN 72 y.o. year old female  has a past medical history of Adenomyosis, Asthma, Asthma, BRCA gene mutation negative, COVID-19 (09/2021), Cystocele, Fibroid, High cholesterol, Menopausal symptoms, Migraine, Migraine, Osteopenia (2017), PMR (polymyalgia rheumatica) ([facility]) (2022), PONV (postoperative nausea and vomiting), Rectocele, Urinary incontinence, and Uterine prolapse. here with :  1.  Migraine headaches   -- Continue Amitriptyline 75 mg at  bedtime.  Did advise the patient that amitriptyline can be associated with angle-closure glaucoma.  It also causes dry eyes.  Advised patient that if she wants to try reducing her dose down to 50 mg she can give that a try as well.  At this time the patient is hesitant to reduce her dose of amitriptyline. -- Continue Imitrex for abortive therapy-advised to take as soon as she feels a headache starting -- advised to call if symptoms worsen or she develop new symptoms -- Follow-up in 6 months or sooner if needed   Ward Givens, MSN, NP-C 02/15/2023, 9:38 AM [facility] [address] [place], [place] [address] [phone]

## 2023-03-09 DIAGNOSIS — E038 Other specified hypothyroidism: Secondary | ICD-10-CM | POA: Diagnosis not present

## 2023-03-09 DIAGNOSIS — K219 Gastro-esophageal reflux disease without esophagitis: Secondary | ICD-10-CM | POA: Diagnosis not present

## 2023-03-09 DIAGNOSIS — E785 Hyperlipidemia, unspecified: Secondary | ICD-10-CM | POA: Diagnosis not present

## 2023-03-14 NOTE — Progress Notes (Signed)
GYNECOLOGY  VISIT   HPI: 72 y.o.   Married  Caucasian  female   [phone] with No LMP recorded. Patient has had a hysterectomy.   here for  breast and pelvic exam and follow up of ERT. Uses transdermal estradiol patch.  Has vaginal dryness.    Pt wants to discuss unwanted weight loss.  Changed her diet to be more protein based.  Exercising.  Feels good.   Had visit with Dr. Waynard Edwards recently.  Had reassuring blood work per patient.    GYNECOLOGIC HISTORY: No LMP recorded. Patient has had a hysterectomy. Contraception:  hysterectomy Menopausal hormone therapy:  estradiol patch.  Last mammogram:  01/05/22 Breast Density Cat C, BI-RADS CAT 1 neg.  She will [facility] at [facility]. Last pap smear:   2011 normal  HEALTH SCREENING: Colonoscopy done at age 69 and was normal.  BMD:  03/2022 - osteopenia of bilateral hips, normal forearm.         OB History     Gravida  4   Para  2   Term  2   Preterm      AB  2   Living  2      SAB      IAB      Ectopic      Multiple      Live Births                 Patient Active Problem List   Diagnosis Date Noted   Spondylolisthesis of lumbar region 01/27/2020   Osteopenia    Fibroid    Adenomyosis    Uterine prolapse    Urinary incontinence    Cystocele    Rectocele    Menopausal symptoms    Migraine    Asthma     Past Medical History:  Diagnosis Date   Adenomyosis    Asthma    "really well controlled"   Asthma    BRCA gene mutation negative    COVID-19 09/2021   had headache/nausea/ runny nose.   Cystocele    Fibroid    High cholesterol    Menopausal symptoms    Migraine    Migraine    Osteopenia 2017   Resolved.  BMD normal in 2017.   PMR (polymyalgia rheumatica) (HCC) 2022   PONV (postoperative nausea and vomiting)    Rectocele    Urinary incontinence    Uterine prolapse     Past Surgical History:  Procedure Laterality Date   ANTERIOR LAT LUMBAR FUSION Left 01/27/2020   Procedure: Left Lumbar  three-four Lumbar four-five Anterolateral lumbar interbody fusion;  Surgeon: Maeola Harman, MD;  [facility]: [facility];  Service: Neurosurgery;  Laterality: Left;   APPENDECTOMY     BACK SURGERY  1999   L4-L5   BLADDER SUSPENSION     CATARACT EXTRACTION, BILATERAL  07/2018   COLONOSCOPY  10/2011   HYMENECTOMY     75 - 44 years old   LUMBAR PERCUTANEOUS PEDICLE SCREW 2 LEVEL N/A 01/27/2020   Procedure: Percutaneous Pedicle Screw Fixation Lumbar three-Lumbar five;  Surgeon: Maeola Harman, MD;  Location: [facility];  Service: Neurosurgery;  Laterality: N/A;   NASAL SINUS SURGERY     TONSILLECTOMY     VAGINAL HYSTERECTOMY  2011   Vag. Hyst BSO A/P repair-Sling    Current Outpatient Medications  Medication Sig Dispense Refill   amitriptyline (ELAVIL) 50 MG tablet Take 1.5 tablets (75 mg total) by mouth at bedtime. 135 tablet 1  B Complex Vitamins (VITAMIN B COMPLEX PO) Take by mouth.     Biotin 5000 MCG TABS Take 5,000 mcg by mouth daily.      Carboxymethylcell-Hypromellose (GENTEAL) 0.25-0.3 % GEL Place 1 drop into both eyes daily.     Ergocalciferol 2000 units CAPS Take 3,000 Units by mouth daily. 30 capsule    fexofenadine (ALLEGRA) 180 MG tablet Take 180 mg by mouth daily.     folic acid (FOLVITE) 1 MG tablet Take 1 mg by mouth daily.     methotrexate (RHEUMATREX) 2.5 MG tablet 7.5 mg See admin instructions.     Multiple Vitamin (MULTIVITAMIN WITH MINERALS) TABS tablet Take 1 tablet by mouth daily.     Probiotic Product (PROBIOTIC DAILY PO) Take 1 tablet by mouth daily. RAW probiotic     rosuvastatin (CRESTOR) 40 MG tablet TAKE 1 TABLET BY MOUTH EVERY DAY 90 tablet 1   SUMAtriptan (IMITREX) 100 MG tablet Take 1 tablet at the onset of migraine.May repeat in 2 hours if headache persists or recurs. Only 2 tabs/24 hours 15 tablet 11   albuterol (VENTOLIN HFA) 108 (90 Base) MCG/ACT inhaler Inhale 2 puffs into the lungs every 4 (four) hours as needed. (Patient not taking: Reported on 03/28/2023)      budesonide-formoterol (SYMBICORT) 160-4.5 MCG/ACT inhaler Inhale 2 puffs into the lungs every morning.  (Patient not taking: Reported on 03/28/2023)     estradiol (VIVELLE-DOT) 0.0375 MG/24HR PLACE 1 PATCH ONTO THE SKIN 2 (TWO) TIMES A WEEK. APPLY ANYWHERE ON LOWER ABDOMEN 24 patch 0   No current facility-administered medications for this visit.     ALLERGIES: Hydrocodone-acetaminophen, Penicillins, Meloxicam, Other, Peanut (diagnostic), Prednisone, Zocor [simvastatin], Codeine, and Tramadol hcl  Family History  Problem Relation Age of Onset   Diabetes Mother    Leukemia Mother    Cancer Mother        leukemia   Heart disease Father    Retinitis pigmentosa Father    Migraines Father    Healthy Sister    Healthy Sister    Leukemia Brother    Healthy Child    BRCA 1/2 Child    BRCA 1/2 Child    Migraines Paternal Uncle     Social History   Socioeconomic History   Marital status: Married    Spouse name: [name]   Number of children: 2   Years of education: 16   Highest education level: Not on file  Occupational History    Employer: [employer]    Comment: [employer]  Tobacco Use   Smoking status: Former    Types: Cigarettes    Quit date: 05/08/1972    Years since quitting: 50.9   Smokeless tobacco: Never   Tobacco comments:    in college  Vaping Use   Vaping Use: Never used  Substance and Sexual Activity   Alcohol use: Yes    Alcohol/week: 2.0 standard drinks of alcohol    Types: 2 Glasses of wine per week   Drug use: No   Sexual activity: Not Currently    Partners: Male    Birth control/protection: Surgical    Comment: hysterectomy  Other Topics Concern   Not on file  Social History Narrative   Right handed.  Caffeine 2 cups daily, Married, 2 kids, 5 grand kids.    Social Determinants of Health   Financial Resource Strain: Not on file  Food Insecurity: Not on file  Transportation Needs: Not on file  Physical Activity: Not on file  Stress: Not  on file  Social  Connections: Not on file  Intimate Partner Violence: Not on file    Review of Systems  All other systems reviewed and are negative.   PHYSICAL EXAMINATION:    BP 118/84 (BP Location: Right Arm, Patient Position: Sitting, Cuff Size: Normal)   Pulse 84   Ht 5' 2.5" (1.588 m)   Wt 122 lb (55.3 kg)   SpO2 99%   BMI 21.96 kg/m     General appearance: alert, cooperative and appears stated age Head: Normocephalic, without obvious abnormality, atraumatic Neck: no adenopathy, supple, symmetrical, trachea midline and thyroid normal to inspection and palpation Lungs: clear to auscultation bilaterally Breasts: normal appearance, no masses or tenderness, No nipple retraction or dimpling, No nipple discharge or bleeding, No axillary or supraclavicular adenopathy Heart: regular rate and rhythm Abdomen: soft, non-tender, no masses,  no organomegaly Extremities: extremities normal, atraumatic, no cyanosis or edema Skin: Skin color, texture, turgor normal. No rashes or lesions Lymph nodes: Cervical, supraclavicular, and axillary nodes normal. No abnormal inguinal nodes palpated Neurologic: Grossly normal  Pelvic: External genitalia:  left hymen with window noted.               Urethra:  normal appearing urethra with no masses, tenderness or lesions              Bartholins and Skenes: normal                 Vagina: normal appearing vagina with normal color and discharge, no lesions              Cervix: absent                Bimanual Exam:  Uterus:  absent              Adnexa: no mass, fullness, tenderness              Rectal exam: yes.  Confirms.              Anus:  normal sphincter tone, no lesions  Chaperone was present for exam:  Warren Lacy, CMA  ASSESSMENT  Well woman visit with gynecologic exam.  ERT patient.  Status post TVH with BSO and prolapse repair. Osteopenia.  FH CAD. Polymyalgia rheumatica. FH BRCA2.  Patient tested negative.  Hx hymenectomy.  Left hymenal remnant  noted. Weight loss.   PLAN  Discused WHI and use of ERT which can increase risk of PE, DVT, stroke.  Refill of Vivelle Dot 0.0375 twice weekly.  #24, RF none. She will update her mammogram.  If mammogram normal, will refill ERT for the remainder of the year.  BMD next year.  No treatment needed for the hymenal remnant if it is not causing pain or discomfort.  I recommend follow up with her PCP if weight loss continues.  FU here yearly.    35 min  total time was spent for this patient encounter, including preparation, face-to-face counseling with the patient, coordination of care, and documentation of the encounter.

## 2023-03-23 DIAGNOSIS — Z780 Asymptomatic menopausal state: Secondary | ICD-10-CM | POA: Diagnosis not present

## 2023-03-23 DIAGNOSIS — H6121 Impacted cerumen, right ear: Secondary | ICD-10-CM | POA: Diagnosis not present

## 2023-03-23 DIAGNOSIS — R82998 Other abnormal findings in urine: Secondary | ICD-10-CM | POA: Diagnosis not present

## 2023-03-23 DIAGNOSIS — Z0000 Encounter for general adult medical examination without abnormal findings: Secondary | ICD-10-CM | POA: Diagnosis not present

## 2023-03-23 DIAGNOSIS — J309 Allergic rhinitis, unspecified: Secondary | ICD-10-CM | POA: Diagnosis not present

## 2023-03-23 DIAGNOSIS — J45909 Unspecified asthma, uncomplicated: Secondary | ICD-10-CM | POA: Diagnosis not present

## 2023-03-23 DIAGNOSIS — M353 Polymyalgia rheumatica: Secondary | ICD-10-CM | POA: Diagnosis not present

## 2023-03-23 DIAGNOSIS — E785 Hyperlipidemia, unspecified: Secondary | ICD-10-CM | POA: Diagnosis not present

## 2023-03-23 DIAGNOSIS — G43909 Migraine, unspecified, not intractable, without status migrainosus: Secondary | ICD-10-CM | POA: Diagnosis not present

## 2023-03-23 DIAGNOSIS — G4700 Insomnia, unspecified: Secondary | ICD-10-CM | POA: Diagnosis not present

## 2023-03-28 ENCOUNTER — Encounter: Payer: Self-pay | Admitting: Obstetrics and Gynecology

## 2023-03-28 ENCOUNTER — Ambulatory Visit: Payer: PPO | Admitting: Obstetrics and Gynecology

## 2023-03-28 VITALS — BP 118/84 | HR 84 | Ht 62.5 in | Wt 122.0 lb

## 2023-03-28 DIAGNOSIS — R634 Abnormal weight loss: Secondary | ICD-10-CM | POA: Diagnosis not present

## 2023-03-28 DIAGNOSIS — Z01419 Encounter for gynecological examination (general) (routine) without abnormal findings: Secondary | ICD-10-CM

## 2023-03-28 DIAGNOSIS — N898 Other specified noninflammatory disorders of vagina: Secondary | ICD-10-CM | POA: Diagnosis not present

## 2023-03-28 DIAGNOSIS — Z79818 Long term (current) use of other agents affecting estrogen receptors and estrogen levels: Secondary | ICD-10-CM

## 2023-03-28 DIAGNOSIS — M8589 Other specified disorders of bone density and structure, multiple sites: Secondary | ICD-10-CM | POA: Diagnosis not present

## 2023-03-28 DIAGNOSIS — Z79899 Other long term (current) drug therapy: Secondary | ICD-10-CM

## 2023-03-28 MED ORDER — ESTRADIOL 0.0375 MG/24HR TD PTTW: MEDICATED_PATCH | TRANSDERMAL | 0 refills | Status: DC

## 2023-03-28 NOTE — Patient Instructions (Signed)

## 2023-03-30 DIAGNOSIS — H9191 Unspecified hearing loss, right ear: Secondary | ICD-10-CM | POA: Diagnosis not present

## 2023-03-30 DIAGNOSIS — H6121 Impacted cerumen, right ear: Secondary | ICD-10-CM | POA: Diagnosis not present

## 2023-04-23 DIAGNOSIS — E113292 Type 2 diabetes mellitus with mild nonproliferative diabetic retinopathy without macular edema, left eye: Secondary | ICD-10-CM | POA: Diagnosis not present

## 2023-04-23 DIAGNOSIS — Z821 Family history of blindness and visual loss: Secondary | ICD-10-CM | POA: Diagnosis not present

## 2023-04-23 DIAGNOSIS — M353 Polymyalgia rheumatica: Secondary | ICD-10-CM | POA: Diagnosis not present

## 2023-04-25 DIAGNOSIS — Z1329 Encounter for screening for other suspected endocrine disorder: Secondary | ICD-10-CM | POA: Diagnosis not present

## 2023-04-25 DIAGNOSIS — Z7952 Long term (current) use of systemic steroids: Secondary | ICD-10-CM | POA: Diagnosis not present

## 2023-04-25 DIAGNOSIS — M353 Polymyalgia rheumatica: Secondary | ICD-10-CM | POA: Diagnosis not present

## 2023-04-25 DIAGNOSIS — Z6821 Body mass index (BMI) 21.0-21.9, adult: Secondary | ICD-10-CM | POA: Diagnosis not present

## 2023-04-25 DIAGNOSIS — K209 Esophagitis, unspecified without bleeding: Secondary | ICD-10-CM | POA: Diagnosis not present

## 2023-04-25 DIAGNOSIS — F19959 Other psychoactive substance use, unspecified with psychoactive substance-induced psychotic disorder, unspecified: Secondary | ICD-10-CM | POA: Diagnosis not present

## 2023-04-25 DIAGNOSIS — M1991 Primary osteoarthritis, unspecified site: Secondary | ICD-10-CM | POA: Diagnosis not present

## 2023-04-25 DIAGNOSIS — Z79899 Other long term (current) drug therapy: Secondary | ICD-10-CM | POA: Diagnosis not present

## 2023-05-01 ENCOUNTER — Telehealth: Payer: Self-pay | Admitting: Adult Health

## 2023-05-01 NOTE — Telephone Encounter (Signed)
Pt is asking for the opportunity of being seen earlier due to an increase in her migraines.

## 2023-05-02 NOTE — Telephone Encounter (Signed)
Called and LVM rq CB 

## 2023-05-02 NOTE — Telephone Encounter (Signed)
The patient called back.  She is on Amitriptyline and she states its fine. She was rarely having a migraine but now over the last 6 to 8 weeks she has been having more migraines.  She denies any new symptoms.  The patient states right now she is at the beach. The first 2 days she did have a bad headache but the last 2 days she has not.  Imitrex works well for her consistently as long as she takes it early.  Patient states she knows her triggers but is unsure why she is having an uptick in migraines right now. Pt states she will keep start keeping a chart of what she eats, etc and will let us know if she feels she needs a sooner appointment. She states she is sleeping very well. She will also watch her hydration. Pt verbalized appreciation for our call.

## 2023-07-05 ENCOUNTER — Other Ambulatory Visit: Payer: Self-pay

## 2023-07-05 DIAGNOSIS — Z01419 Encounter for gynecological examination (general) (routine) without abnormal findings: Secondary | ICD-10-CM

## 2023-07-05 NOTE — Telephone Encounter (Signed)
Medication refill request: estradiol 0.0375mg  patch Last AEX:  03-28-23 Next AEX: not scheduled Last MMG (if hormonal medication request): 01-05-22 category c density birads 1:neg Refill authorized: per aex pt was to have mmg done before more refills will be given. Per [facility] no mmg scheduled yet. Left message letting patient know she will need to have mmg done & sent to [place] for refills. This rx denied

## 2023-07-05 NOTE — Telephone Encounter (Signed)
I agree with your plan.  I need to have an updated mammogram that is normal prior to refilling her ERT.

## 2023-07-10 NOTE — Telephone Encounter (Signed)
Pt LVM in triage line stating her mammo is scheduled at [facility] for tomorrow 07/11/2023 @ 315pm and is requesting refill on patches be sent to pharmacy.   Will leave this encounter in rx request for f/u purposes. Once report is received, will send request back to Dr. Edward Jolly for authorization.

## 2023-07-11 DIAGNOSIS — Z1231 Encounter for screening mammogram for malignant neoplasm of breast: Secondary | ICD-10-CM | POA: Diagnosis not present

## 2023-07-13 ENCOUNTER — Encounter: Payer: Self-pay | Admitting: Obstetrics and Gynecology

## 2023-07-13 MED ORDER — ESTRADIOL 0.0375 MG/24HR TD PTTW: MEDICATED_PATCH | TRANSDERMAL | 2 refills | Status: DC

## 2023-07-13 NOTE — Telephone Encounter (Signed)
Report received from mammo performed on 07/11/2023: neg birads 1; Cat C

## 2023-07-13 NOTE — Addendum Note (Signed)
Addended by: Jodelle Red D on: 07/13/2023 09:48 AM   Modules accepted: Orders

## 2023-08-08 DIAGNOSIS — K209 Esophagitis, unspecified without bleeding: Secondary | ICD-10-CM | POA: Diagnosis not present

## 2023-08-08 DIAGNOSIS — Z7952 Long term (current) use of systemic steroids: Secondary | ICD-10-CM | POA: Diagnosis not present

## 2023-08-08 DIAGNOSIS — Z6821 Body mass index (BMI) 21.0-21.9, adult: Secondary | ICD-10-CM | POA: Diagnosis not present

## 2023-08-08 DIAGNOSIS — M1991 Primary osteoarthritis, unspecified site: Secondary | ICD-10-CM | POA: Diagnosis not present

## 2023-08-08 DIAGNOSIS — F19959 Other psychoactive substance use, unspecified with psychoactive substance-induced psychotic disorder, unspecified: Secondary | ICD-10-CM | POA: Diagnosis not present

## 2023-08-08 DIAGNOSIS — M353 Polymyalgia rheumatica: Secondary | ICD-10-CM | POA: Diagnosis not present

## 2023-08-08 DIAGNOSIS — Z79899 Other long term (current) drug therapy: Secondary | ICD-10-CM | POA: Diagnosis not present

## 2023-09-25 ENCOUNTER — Other Ambulatory Visit: Payer: Self-pay | Admitting: Internal Medicine

## 2023-09-26 ENCOUNTER — Encounter: Payer: Self-pay | Admitting: Adult Health

## 2023-09-26 ENCOUNTER — Ambulatory Visit: Payer: PPO | Admitting: Adult Health

## 2023-09-26 VITALS — BP 129/75 | HR 79 | Ht 63.0 in | Wt 122.0 lb

## 2023-09-26 DIAGNOSIS — J3489 Other specified disorders of nose and nasal sinuses: Secondary | ICD-10-CM

## 2023-09-26 DIAGNOSIS — G43001 Migraine without aura, not intractable, with status migrainosus: Secondary | ICD-10-CM | POA: Diagnosis not present

## 2023-09-26 MED ORDER — SUMATRIPTAN SUCCINATE 100 MG PO TABS: ORAL_TABLET | ORAL | 11 refills | Status: AC

## 2023-09-26 MED ORDER — AMITRIPTYLINE HCL 50 MG PO TABS: 75.0000 mg | ORAL_TABLET | Freq: Every day | ORAL | 3 refills | Status: DC

## 2023-09-26 NOTE — Progress Notes (Signed)
.meg    PATIENT: Courtney Horne DOB: 07/03/1951  REASON FOR VISIT: follow up HISTORY FROM: patient Chief Complaint  Patient presents with   Follow-up    Pt in 20 Pt here for Migraine f/u  Pt states Headache pain in the middle of eyebrows Pt states when she bends over pressure between eyebrows      HISTORY OF PRESENT ILLNESS: Today 09/26/23:  Courtney Horne is a 72 y.o. female  with a history of migraines managed with amitriptyline 75mg  at bedtime and sumatriptan as needed.. She reports that the amitriptyline is generally effective. She has identified a threshold of pain at which she needs to take sumatriptan for her migraines, and reports that she only needed to take a second dose once in the past year. Her migraines occur in clusters, with several days of headaches followed by 1-2 weeks of relief.  In addition to her migraines, the patient has been experiencing significant sinus pressure, particularly in the forehead area- between the eyebrows. She has been managing this with daily Allegra and twice-daily Flonase, as recommended by her allergist, but has not seen significant improvement. The pressure is particularly noticeable when she bends over, and can become quite painful by the end of the day. This pressure does not always evolve into a headache.  She reported similar symptoms to last time she was seen.  Has never seen ENT.    Discussed the use of AI scribe software for clinical note transcription with the patient, who gave verbal consent to proceed.     02/15/23: Courtney Horne is a 72 y.o. female with a history of migraine headaches. Returns today for follow-up. She has been taking amitriptyline 75 mg at bedtime. Reports that headaches are weather related. Reports she did have a severe migraine over the weekend. She states that if she bends over she feels pressure in the forehead. Not sure if this is related to her sinuses.  She states that she has been hesitant to take  Imitrex as soon as she feels her symptoms because she is concerned it may be a sinus headache versus a true migraine.  Patient also reports that she has seen her ophthalmologist and her retinal specialist due to having some trouble seeing road signs that are along distance away.  She states that the exams have been relatively unremarkable she sees the retinal specialist again in 3 months    12/15/21: Courtney Horne is a 72 year old female with a history of migraine headaches.  She returns today for follow-up.  She reports her headaches continue to be managed well with amitriptyline.  She only has approximately 1 headache a month.  Sometimes is 1 headache every other month.  Imitrex continues to resolve her headache fairly quickly.  She returns today for evaluation.  11/01/20: Courtney Horne is a 72 year old female with a history migraine headaches. She returns today for follow-up.  She states overall she has been doing well with her headaches.  She has approximately 1-2 headaches a month.  She continues to use amitriptyline and Imitrex.  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  She states that patient did not start until after she got the Covid booster.  She has had x-rays with her orthopedist that was unremarkable.  Her sedimentation rate was elevated and she has a referral to rheumatology.  She returns today for an evaluation.   10/28/19: Ms.  Horne is a 72 year old female with a history of migraine headaches.  She returns today for follow-up.  She states that her headaches have been under relatively good control.  She may have approximately 3 headaches a month.  She states that her headaches are typically triggered by alcohol or weather.  She states that Imitrex continues to work well for her.  She states that she golfs 3 times a week.  If it is too cold to golf she will go walking.  She also does puzzles.  She  states that she has been taking amitriptyline 75 mg for approximately 10 years.  She states that it is worked well for her migraines and her sleep.  She has noticed recently she has been waking up at least once at night and has difficulty going back to sleep.  She is questioning whether amitriptyline could be increased to 100 mg.  She denies any issues with constipation, urinary retention, blurred vision or dry mouth.  I reviewed recent EKG completed November 2020.   HISTORY 10/09/18:   Courtney Horne is a 72 year old female with a history of migraine.  She returns today for follow-up.  She reports that her headaches are relatively good control.  She has approximately 1 headache a week.  Her headaches tend to occur on the left side of the face.  She denies photophobia or phonophobia.  She states that she takes Imitrex her headache typically resolves in 15 to 30 minutes.  She notes to that with amitriptyline she was sleeping well but now she is waking up 2-3 times at night.  She returns today for evaluation.  REVIEW OF SYSTEMS: Out of a complete 14 system review of symptoms, the patient complains only of the following symptoms, and all other reviewed systems are negative.  See HPI  ALLERGIES: Allergies  Allergen Reactions   Hydrocodone-Acetaminophen Nausea Only   Penicillins Itching and Rash    Did it involve swelling of the face/tongue/throat, SOB, or low BP? No Did it involve sudden or severe rash/hives, skin peeling, or any reaction on the inside of your mouth or nose? No Did you need to seek medical attention at a hospital or doctor's office? No When did it last happen?   15 years ago   If all above answers are "NO", may proceed with cephalosporin use.    Meloxicam Itching and Other (See Comments)   Other Other (See Comments)   Peanut (Diagnostic) Other (See Comments)   Prednisone Other (See Comments)   Zocor [Simvastatin] Other (See Comments)    UNSPECIFIED REACTION    Codeine Nausea  And Vomiting and Other (See Comments)   Tramadol Hcl Itching    HOME MEDICATIONS: Outpatient Medications Prior to Visit  Medication Sig Dispense Refill   albuterol (VENTOLIN HFA) 108 (90 Base) MCG/ACT inhaler Inhale 2 puffs into the lungs as needed.     amitriptyline (ELAVIL) 50 MG tablet Take 1.5 tablets (75 mg total) by mouth at bedtime. 135 tablet 1   B Complex Vitamins (VITAMIN B COMPLEX PO) Take by mouth.     Biotin 5000 MCG TABS Take 5,000 mcg by mouth daily.      budesonide-formoterol (SYMBICORT) 160-4.5 MCG/ACT inhaler Inhale 2 puffs into the lungs as needed.     Carboxymethylcell-Hypromellose (GENTEAL) 0.25-0.3 % GEL Place 1 drop into both eyes daily.     Ergocalciferol 2000 units CAPS Take 3,000 Units by mouth daily. 30 capsule    estradiol (VIVELLE-DOT) 0.0375 MG/24HR PLACE 1 PATCH ONTO THE SKIN  2 (TWO) TIMES A WEEK. APPLY ANYWHERE ON LOWER ABDOMEN 24 patch 2   fexofenadine (ALLEGRA) 180 MG tablet Take 180 mg by mouth daily.     folic acid (FOLVITE) 1 MG tablet Take 1 mg by mouth daily.     methotrexate (RHEUMATREX) 2.5 MG tablet 5 mg See admin instructions.     Multiple Vitamin (MULTIVITAMIN WITH MINERALS) TABS tablet Take 1 tablet by mouth daily.     Probiotic Product (PROBIOTIC DAILY PO) Take 1 tablet by mouth daily. RAW probiotic     rosuvastatin (CRESTOR) 40 MG tablet TAKE 1 TABLET BY MOUTH EVERY DAY 90 tablet 0   SUMAtriptan (IMITREX) 100 MG tablet Take 1 tablet at the onset of migraine.May repeat in 2 hours if headache persists or recurs. Only 2 tabs/24 hours 15 tablet 11   No facility-administered medications prior to visit.    PAST MEDICAL HISTORY: Past Medical History:  Diagnosis Date   Adenomyosis    Asthma    "really well controlled"   Asthma    BRCA gene mutation negative    COVID-19 09/2021   had headache/nausea/ runny nose.   Cystocele    Fibroid    High cholesterol    Menopausal symptoms    Migraine    Migraine    Osteopenia 2017   Resolved.  BMD  normal in 2017.   PMR (polymyalgia rheumatica) (HCC) 2022   PONV (postoperative nausea and vomiting)    Rectocele    Urinary incontinence    Uterine prolapse     PAST SURGICAL HISTORY: Past Surgical History:  Procedure Laterality Date   ANTERIOR LAT LUMBAR FUSION Left 01/27/2020   Procedure: Left Lumbar three-four Lumbar four-five Anterolateral lumbar interbody fusion;  Surgeon: Maeola Harman, MD;  Location: [facility] OR;  Service: Neurosurgery;  Laterality: Left;   APPENDECTOMY     BACK SURGERY  1999   L4-L5   BLADDER SUSPENSION     CATARACT EXTRACTION, BILATERAL  07/2018   COLONOSCOPY  10/2011   HYMENECTOMY     21 - 50 years old   LUMBAR PERCUTANEOUS PEDICLE SCREW 2 LEVEL N/A 01/27/2020   Procedure: Percutaneous Pedicle Screw Fixation Lumbar three-Lumbar five;  Surgeon: Maeola Harman, MD;  Location: [facility] OR;  Service: Neurosurgery;  Laterality: N/A;   NASAL SINUS SURGERY     TONSILLECTOMY     VAGINAL HYSTERECTOMY  2011   Vag. Hyst BSO A/P repair-Sling    FAMILY HISTORY: Family History  Problem Relation Age of Onset   Diabetes Mother    Leukemia Mother    Cancer Mother        leukemia   Heart disease Father    Retinitis pigmentosa Father    Migraines Father    Healthy Sister    Healthy Sister    Leukemia Brother    Healthy Child    BRCA 1/2 Child    BRCA 1/2 Child    Migraines Paternal Uncle     SOCIAL HISTORY: Social History   Socioeconomic History   Marital status: Married    Spouse name: [name]   Number of children: 2   Years of education: 16   Highest education level: Not on file  Occupational History    Employer: [employer]    Comment: [employer]  Tobacco Use   Smoking status: Former    Current packs/day: 0.00    Types: Cigarettes    Quit date: 05/08/1972    Years since quitting: 51.4   Smokeless tobacco: Never   Tobacco comments:  in college  Vaping Use   Vaping status: Never Used  Substance and Sexual Activity   Alcohol use: Yes    Alcohol/week: 1.0  standard drink of alcohol    Types: 1 Glasses of wine per week   Drug use: No   Sexual activity: Not Currently    Partners: Male    Birth control/protection: Surgical    Comment: hysterectomy  Other Topics Concern   Not on file  Social History Narrative   Right handed.  Caffeine 2 cups daily, Married, 2 kids, 5 grand kids.    Social Determinants of Health   Financial Resource Strain: Not on file  Food Insecurity: Not on file  Transportation Needs: Not on file  Physical Activity: Not on file  Stress: Not on file  Social Connections: Not on file  Intimate Partner Violence: Not on file      PHYSICAL EXAM  Vitals:   09/26/23 1123  BP: 129/75  Pulse: 79  Weight: 122 lb (55.3 kg)  Height: 5\' 3"  (1.6 m)    Body mass index is 21.61 kg/m.  Generalized: Well developed, in no acute distress   Neurological examination  Mentation: Alert oriented to time, place, history taking. Follows all commands speech and language fluent Cranial nerve II-XII: Pupils were equal round reactive to light. Extraocular movements were full, visual field were full on confrontational test. . Head turning and shoulder shrug  were normal and symmetric. Motor: The motor testing reveals 5 over 5 strength of all 4 extremities. Good symmetric motor tone is noted throughout.  Sensory: Sensory testing is intact to soft touch on all 4 extremities. No evidence of extinction is noted.  Coordination: Cerebellar testing reveals good finger-nose-finger and heel-to-shin bilaterally.  Gait and station: Gait is normal.    DIAGNOSTIC DATA (LABS, IMAGING, TESTING) - I reviewed patient records, labs, notes, testing and imaging myself where available.  Lab Results  Component Value Date   WBC 6.6 10/25/2022   HGB 13.8 10/25/2022   HCT 42.8 10/25/2022   MCV 92 10/25/2022   PLT 199 10/25/2022      Component Value Date/Time   NA 141 10/25/2022 0940   K 3.8 10/25/2022 0940   CL 104 10/25/2022 0940   CO2 24  10/25/2022 0940   GLUCOSE 87 10/25/2022 0940   GLUCOSE 107 (H) 01/23/2020 1206   BUN 15 10/25/2022 0940   CREATININE 0.87 10/25/2022 0940   CREATININE 0.92 01/10/2016 0934   CALCIUM 9.9 10/25/2022 0940   PROT 6.6 10/25/2022 0940   ALBUMIN 4.4 10/25/2022 0940   AST 37 10/25/2022 0940   ALT 38 (H) 10/25/2022 0940   ALKPHOS 71 10/25/2022 0940   BILITOT 0.9 10/25/2022 0940   GFRNONAA 65 10/06/2020 0827   GFRAA 74 10/06/2020 0827   Lab Results  Component Value Date   CHOL 160 05/11/2017   HDL 73 05/11/2017   LDLCALC 104 (H) 01/23/2017   TRIG 65 05/11/2017   CHOLHDL 3.1 01/23/2017   Lab Results  Component Value Date   HGBA1C 5.7 (H) 10/25/2022    Lab Results  Component Value Date   TSH 5.460 (H) 10/25/2022      ASSESSMENT AND PLAN 72 y.o. year old female  has a past medical history of Adenomyosis, Asthma, Asthma, BRCA gene mutation negative, COVID-19 (09/2021), Cystocele, Fibroid, High cholesterol, Menopausal symptoms, Migraine, Migraine, Osteopenia (2017), PMR (polymyalgia rheumatica) (HCC) (2022), PONV (postoperative nausea and vomiting), Rectocele, Urinary incontinence, and Uterine prolapse. here with :  1.  Migraine  headaches 2.  Sinus pressure   -- Continue Amitriptyline 75 mg at bedtime.  -- Continue Imitrex for abortive therapy-advised to take as soon as she feels a headache starting -- Allegra and Flonase have not been beneficial for sinus pressure.  Will refer to ENT for evaluation --Did advise that if evaluation by ENT was relatively unremarkable we could consider MRI of the brain. -- advised to call if symptoms worsen or she develop new symptoms -- Follow-up in 1 year or sooner if needed   Butch Penny, MSN, NP-C 09/26/2023, 11:25 AM [facility] [address], [address] [place], [place] [address] [phone]

## 2023-10-01 ENCOUNTER — Telehealth: Payer: Self-pay | Admitting: Adult Health

## 2023-10-01 NOTE — Telephone Encounter (Signed)
Referral for ENT fax to [facility]. Phone: [phone], Fax:  [phone].

## 2023-10-12 ENCOUNTER — Other Ambulatory Visit: Payer: Self-pay | Admitting: Internal Medicine

## 2023-10-12 ENCOUNTER — Ambulatory Visit: Payer: PPO | Attending: Internal Medicine

## 2023-10-12 DIAGNOSIS — G43E09 Chronic migraine with aura, not intractable, without status migrainosus: Secondary | ICD-10-CM | POA: Diagnosis not present

## 2023-10-12 DIAGNOSIS — E785 Hyperlipidemia, unspecified: Secondary | ICD-10-CM | POA: Diagnosis not present

## 2023-10-12 DIAGNOSIS — J329 Chronic sinusitis, unspecified: Secondary | ICD-10-CM | POA: Diagnosis not present

## 2023-10-13 LAB — TSH: TSH: 5.55 u[IU]/mL — ABNORMAL HIGH (ref 0.450–4.500)

## 2023-10-13 LAB — T4, FREE: Free T4: 0.9 ng/dL (ref 0.82–1.77)

## 2023-10-15 ENCOUNTER — Other Ambulatory Visit: Payer: Self-pay | Admitting: Internal Medicine

## 2023-10-15 ENCOUNTER — Telehealth: Payer: Self-pay | Admitting: Internal Medicine

## 2023-10-15 DIAGNOSIS — E785 Hyperlipidemia, unspecified: Secondary | ICD-10-CM

## 2023-10-15 DIAGNOSIS — Z8249 Family history of ischemic heart disease and other diseases of the circulatory system: Secondary | ICD-10-CM

## 2023-10-15 DIAGNOSIS — M8580 Other specified disorders of bone density and structure, unspecified site: Secondary | ICD-10-CM

## 2023-10-15 NOTE — Telephone Encounter (Signed)
Pt addressed see result note.

## 2023-10-15 NOTE — Telephone Encounter (Signed)
Patient states she went to the office to have labs drawn on Friday, but they did not have all the orders they needed to draw them. She mentions that she normally has about 5-6 panels taken at once and she would like to speak with Dr. Tenny Craw' nurse.

## 2023-10-16 DIAGNOSIS — Z8249 Family history of ischemic heart disease and other diseases of the circulatory system: Secondary | ICD-10-CM | POA: Diagnosis not present

## 2023-10-16 DIAGNOSIS — E785 Hyperlipidemia, unspecified: Secondary | ICD-10-CM | POA: Diagnosis not present

## 2023-10-16 DIAGNOSIS — M8580 Other specified disorders of bone density and structure, unspecified site: Secondary | ICD-10-CM | POA: Diagnosis not present

## 2023-10-16 LAB — CBC
Hematocrit: 40.2 % (ref 34.0–46.6)
Hemoglobin: 13.4 g/dL (ref 11.1–15.9)
MCH: 29.5 pg (ref 26.6–33.0)
MCHC: 33.3 g/dL (ref 31.5–35.7)
MCV: 89 fL (ref 79–97)
Platelets: 205 10*3/uL (ref 150–450)
RBC: 4.54 x10E6/uL (ref 3.77–5.28)
RDW: 14.3 % (ref 11.7–15.4)
WBC: 5.5 10*3/uL (ref 3.4–10.8)

## 2023-10-17 LAB — BASIC METABOLIC PANEL
BUN/Creatinine Ratio: 24 (ref 12–28)
BUN: 19 mg/dL (ref 8–27)
CO2: 25 mmol/L (ref 20–29)
Calcium: 9.7 mg/dL (ref 8.7–10.3)
Chloride: 104 mmol/L (ref 96–106)
Creatinine, Ser: 0.8 mg/dL (ref 0.57–1.00)
Glucose: 94 mg/dL (ref 70–99)
Potassium: 4.2 mmol/L (ref 3.5–5.2)
Sodium: 140 mmol/L (ref 134–144)
eGFR: 77 mL/min/{1.73_m2} (ref >59)

## 2023-10-17 LAB — HEPATIC FUNCTION PANEL
ALT: 29 [IU]/L (ref 0–32)
AST: 34 [IU]/L (ref 0–40)
Albumin: 4.3 g/dL (ref 3.8–4.8)
Alkaline Phosphatase: 84 [IU]/L (ref 44–121)
Bilirubin Total: 1 mg/dL (ref 0.0–1.2)
Bilirubin, Direct: 0.32 mg/dL (ref 0.00–0.40)
Total Protein: 6.6 g/dL (ref 6.0–8.5)

## 2023-10-17 LAB — HEMOGLOBIN A1C
Est. average glucose Bld gHb Est-mCnc: 120 mg/dL
Hgb A1c MFr Bld: 5.8 % — ABNORMAL HIGH (ref 4.8–5.6)

## 2023-10-19 LAB — NMR, LIPOPROFILE

## 2023-10-22 DIAGNOSIS — H462 Nutritional optic neuropathy: Secondary | ICD-10-CM | POA: Diagnosis not present

## 2023-10-22 DIAGNOSIS — Z821 Family history of blindness and visual loss: Secondary | ICD-10-CM | POA: Diagnosis not present

## 2023-10-22 DIAGNOSIS — E113292 Type 2 diabetes mellitus with mild nonproliferative diabetic retinopathy without macular edema, left eye: Secondary | ICD-10-CM | POA: Diagnosis not present

## 2023-10-22 DIAGNOSIS — E509 Vitamin A deficiency, unspecified: Secondary | ICD-10-CM | POA: Diagnosis not present

## 2023-10-22 DIAGNOSIS — M353 Polymyalgia rheumatica: Secondary | ICD-10-CM | POA: Diagnosis not present

## 2023-10-23 ENCOUNTER — Telehealth: Payer: Self-pay

## 2023-10-23 NOTE — Telephone Encounter (Signed)
NMR not able to be processed on 10/16/23 due to poor handling at the lab level... I called [facility] and spoke with Portia:   Comment: Test not performed.  Serum was in contact with cells when received which will make the result inaccurate.   Pt will need to have redrawn and will not be charged twice.   Pt has OV 10/24/23.

## 2023-10-24 ENCOUNTER — Encounter: Payer: Self-pay | Admitting: Internal Medicine

## 2023-10-24 ENCOUNTER — Ambulatory Visit: Payer: PPO | Attending: Internal Medicine | Admitting: Internal Medicine

## 2023-10-24 VITALS — BP 140/80 | HR 63 | Ht 63.0 in | Wt 123.0 lb

## 2023-10-24 DIAGNOSIS — Z8249 Family history of ischemic heart disease and other diseases of the circulatory system: Secondary | ICD-10-CM | POA: Diagnosis not present

## 2023-10-24 MED ORDER — ROSUVASTATIN CALCIUM 40 MG PO TABS: 40.0000 mg | ORAL_TABLET | Freq: Every day | ORAL | 3 refills | Status: AC

## 2023-10-24 NOTE — Progress Notes (Signed)
Cardiology Office Note   Date:  10/24/2023   ID:  Courtney Horne, DOB Aug 02, 1951, MRN [id number]  PCP:  Rodrigo Ran, MD  Cardiologist:   Dietrich Pates, MD    F/U of HL     History of Present Illness: Courtney Horne is a 72 y.o. female with a history of HL, mild elevation of LFTs  Myovue in 2010 was normal  LVEF normal    Calcium score in 2019 was 18 (55th percentile).  Calcium was noted in the distal left main and mid LAD. In September 2021 the pt developed problems after a COVID booster   Placed on steroids   She has been treated since for PMR  I saw the pt in Jan 2024   Since seen she has done well from cardiac standpoint   She denies CP  Breathing is good  No palpitations   She remains very active   Current Meds  Medication Sig   albuterol (VENTOLIN HFA) 108 (90 Base) MCG/ACT inhaler Inhale 2 puffs into the lungs as needed.   amitriptyline (ELAVIL) 50 MG tablet Take 1.5 tablets (75 mg total) by mouth at bedtime.   B Complex Vitamins (VITAMIN B COMPLEX PO) Take by mouth.   Biotin 5000 MCG TABS Take 5,000 mcg by mouth daily.    budesonide-formoterol (SYMBICORT) 160-4.5 MCG/ACT inhaler Inhale 2 puffs into the lungs as needed.   Carboxymethylcell-Hypromellose (GENTEAL) 0.25-0.3 % GEL Place 1 drop into both eyes daily.   Ergocalciferol 2000 units CAPS Take 3,000 Units by mouth daily.   estradiol (VIVELLE-DOT) 0.0375 MG/24HR PLACE 1 PATCH ONTO THE SKIN 2 (TWO) TIMES A WEEK. APPLY ANYWHERE ON LOWER ABDOMEN   fexofenadine (ALLEGRA) 180 MG tablet Take 180 mg by mouth daily.   folic acid (FOLVITE) 1 MG tablet Take 1 mg by mouth daily.   methotrexate (RHEUMATREX) 2.5 MG tablet 5 mg See admin instructions.   Multiple Vitamin (MULTIVITAMIN WITH MINERALS) TABS tablet Take 1 tablet by mouth daily.   Probiotic Product (PROBIOTIC DAILY PO) Take 1 tablet by mouth daily. RAW probiotic   SUMAtriptan (IMITREX) 100 MG tablet Take 1 tablet at the onset of migraine.May repeat in 2 hours if  headache persists or recurs. Only 2 tabs/24 hours   [DISCONTINUED] rosuvastatin (CRESTOR) 40 MG tablet TAKE 1 TABLET BY MOUTH EVERY DAY     Allergies:   Hydrocodone-acetaminophen, Penicillins, Meloxicam, Other, Peanut (diagnostic), Prednisone, Zocor [simvastatin], Codeine, and Tramadol hcl   Past Medical History:  Diagnosis Date   Adenomyosis    Asthma    "really well controlled"   Asthma    BRCA gene mutation negative    COVID-19 09/2021   had headache/nausea/ runny nose.   Cystocele    Fibroid    High cholesterol    Menopausal symptoms    Migraine    Migraine    Osteopenia 2017   Resolved.  BMD normal in 2017.   PMR (polymyalgia rheumatica) (HCC) 2022   PONV (postoperative nausea and vomiting)    Rectocele    Urinary incontinence    Uterine prolapse     Past Surgical History:  Procedure Laterality Date   ANTERIOR LAT LUMBAR FUSION Left 01/27/2020   Procedure: Left Lumbar three-four Lumbar four-five Anterolateral lumbar interbody fusion;  Surgeon: Maeola Harman, MD;  Location: [facility] OR;  Service: Neurosurgery;  Laterality: Left;   APPENDECTOMY     BACK SURGERY  1999   L4-L5   BLADDER SUSPENSION     CATARACT EXTRACTION, BILATERAL  07/2018   COLONOSCOPY  10/2011   HYMENECTOMY     50 - 80 years old   LUMBAR PERCUTANEOUS PEDICLE SCREW 2 LEVEL N/A 01/27/2020   Procedure: Percutaneous Pedicle Screw Fixation Lumbar three-Lumbar five;  Surgeon: Maeola Harman, MD;  Location: [facility] OR;  Service: Neurosurgery;  Laterality: N/A;   NASAL SINUS SURGERY     TONSILLECTOMY     VAGINAL HYSTERECTOMY  2011   Vag. Hyst BSO A/P repair-Sling     Social History:  The patient  reports that she quit smoking about 51 years ago. Her smoking use included cigarettes. She has never used smokeless tobacco. She reports current alcohol use of about 1.0 standard drink of alcohol per week. She reports that she does not use drugs.   Family History:  The patient's family history includes BRCA 1/2 in her  child and child; Cancer in her mother; Diabetes in her mother; Healthy in her child, sister, and sister; Heart disease in her father; Leukemia in her brother and mother; Migraines in her father and paternal uncle; Retinitis pigmentosa in her father.    ROS:  Please see the history of present illness. All other systems are reviewed and  Negative to the above problem except as noted.    PHYSICAL EXAM: VS:  BP (!) 140/80   Pulse 63   Ht 5\' 3"  (1.6 m)   Wt 123 lb (55.8 kg)   SpO2 99%   BMI 21.79 kg/m   GEN: Well nourished, well developed, in no acute distress  HEENT: normal  Neck: JVP not elevated.  No  carotid bruit Cardiac: RRR; no murmur  No LE edema  Respiratory:  clear to auscultation GI: soft, nontender  No hepatomegaly  MS: no deformity   EKG:  EKG is ordered today.  SR 63 bpm   Nonspecific ST changes    Lipid Panel    Component Value Date/Time   CHOL 160 05/11/2017 0742   CHOL 184 01/23/2017 0925   TRIG 65 05/11/2017 0742   HDL 73 05/11/2017 0742   CHOLHDL 3.1 01/23/2017 0925   CHOLHDL 2.7 01/10/2016 0934   VLDL 19 01/10/2016 0934   LDLCALC 104 (H) 01/23/2017 0925      Wt Readings from Last 3 Encounters:  10/24/23 123 lb (55.8 kg)  09/26/23 122 lb (55.3 kg)  03/28/23 122 lb (55.3 kg)      ASSESSMENT AND PLAN:  1  HL Will repeat lipomed today   Last tube was hemolyzed   2  CAD   Ca score 18  Remains asymptomatic  3 Metabolics   A1C 5.8  She knows she can eat better   Stay active   Whole, minimally processed foods    Plan for follow-up in 1 year.   Current medicines are reviewed at length with the patient today.  The patient does not have concerns regarding medicines.  Signed, Dietrich Pates, MD  10/24/2023 9:11 AM    [facility] [address] [place], [place], [place]  [address] Phone: [phone]; Fax: [phone]

## 2023-10-24 NOTE — Patient Instructions (Signed)
Medication Instructions:   *If you need a refill on your cardiac medications before your next appointment, please call your pharmacy*   Lab Work: NMR  If you have labs (blood work) drawn today and your tests are completely normal, you will receive your results only by: MyChart Message (if you have MyChart) OR A paper copy in the mail If you have any lab test that is abnormal or we need to change your treatment, we will call you to review the results.   Testing/Procedures:    Follow-Up: At [facility], you and your health needs are our priority.  As part of our continuing mission to provide you with exceptional heart care, we have created designated Provider Care Teams.  These Care Teams include your primary Cardiologist (physician) and Advanced Practice Providers (APPs -  Physician Assistants and Nurse Practitioners) who all work together to provide you with the care you need, when you need it.  We recommend signing up for the patient portal called "MyChart".  Sign up information is provided on this After Visit Summary.  MyChart is used to connect with patients for Virtual Visits (Telemedicine).  Patients are able to view lab/test results, encounter notes, upcoming appointments, etc.  Non-urgent messages can be sent to your provider as well.   To learn more about what you can do with MyChart, go to [email or web address].    Your next appointment:

## 2023-10-25 ENCOUNTER — Other Ambulatory Visit: Payer: Self-pay | Admitting: Otolaryngology

## 2023-10-25 DIAGNOSIS — J0181 Other acute recurrent sinusitis: Secondary | ICD-10-CM

## 2023-10-26 LAB — NMR, LIPOPROFILE
Cholesterol, Total: 156 mg/dL (ref 100–199)
HDL Particle Number: 44.4 umol/L (ref >=30.5)
HDL-C: 77 mg/dL (ref >39)
LDL Particle Number: 673 nmol/L (ref <1000)
LDL Size: 20.1 nmol — ABNORMAL LOW (ref >20.5)
LDL-C (NIH Calc): 66 mg/dL (ref 0–99)
LP-IR Score: 35 (ref <=45)
Small LDL Particle Number: 422 nmol/L (ref <=527)
Triglycerides: 67 mg/dL (ref 0–149)

## 2023-10-26 LAB — VITAMIN D 25 HYDROXY (VIT D DEFICIENCY, FRACTURES): Vit D, 25-Hydroxy: 57 ng/mL (ref 30.0–100.0)

## 2023-11-01 ENCOUNTER — Other Ambulatory Visit: Payer: Self-pay

## 2023-11-01 LAB — VITAMIN D 1,25 DIHYDROXY
Vitamin D 1, 25 (OH)2 Total: 44 pg/mL
Vitamin D2 1, 25 (OH)2: <10 pg/mL
Vitamin D3 1, 25 (OH)2: 43 pg/mL

## 2023-11-01 MED ORDER — ERGOCALCIFEROL 50 MCG (2000 UT) PO CAPS: 4000.0000 [IU] | ORAL_CAPSULE | Freq: Every day | ORAL | Status: DC

## 2023-11-07 DIAGNOSIS — Z6821 Body mass index (BMI) 21.0-21.9, adult: Secondary | ICD-10-CM | POA: Diagnosis not present

## 2023-11-07 DIAGNOSIS — F19959 Other psychoactive substance use, unspecified with psychoactive substance-induced psychotic disorder, unspecified: Secondary | ICD-10-CM | POA: Diagnosis not present

## 2023-11-07 DIAGNOSIS — M1991 Primary osteoarthritis, unspecified site: Secondary | ICD-10-CM | POA: Diagnosis not present

## 2023-11-07 DIAGNOSIS — Z79899 Other long term (current) drug therapy: Secondary | ICD-10-CM | POA: Diagnosis not present

## 2023-11-07 DIAGNOSIS — R7989 Other specified abnormal findings of blood chemistry: Secondary | ICD-10-CM | POA: Diagnosis not present

## 2023-11-07 DIAGNOSIS — Z7952 Long term (current) use of systemic steroids: Secondary | ICD-10-CM | POA: Diagnosis not present

## 2023-11-07 DIAGNOSIS — M353 Polymyalgia rheumatica: Secondary | ICD-10-CM | POA: Diagnosis not present

## 2023-11-07 DIAGNOSIS — K209 Esophagitis, unspecified without bleeding: Secondary | ICD-10-CM | POA: Diagnosis not present

## 2024-01-03 DIAGNOSIS — J3081 Allergic rhinitis due to animal (cat) (dog) hair and dander: Secondary | ICD-10-CM | POA: Diagnosis not present

## 2024-01-03 DIAGNOSIS — J3089 Other allergic rhinitis: Secondary | ICD-10-CM | POA: Diagnosis not present

## 2024-01-03 DIAGNOSIS — J4520 Mild intermittent asthma, uncomplicated: Secondary | ICD-10-CM | POA: Diagnosis not present

## 2024-01-03 DIAGNOSIS — J301 Allergic rhinitis due to pollen: Secondary | ICD-10-CM | POA: Diagnosis not present

## 2024-02-06 DIAGNOSIS — F19959 Other psychoactive substance use, unspecified with psychoactive substance-induced psychotic disorder, unspecified: Secondary | ICD-10-CM | POA: Diagnosis not present

## 2024-02-06 DIAGNOSIS — Z7952 Long term (current) use of systemic steroids: Secondary | ICD-10-CM | POA: Diagnosis not present

## 2024-02-06 DIAGNOSIS — M353 Polymyalgia rheumatica: Secondary | ICD-10-CM | POA: Diagnosis not present

## 2024-02-06 DIAGNOSIS — M1991 Primary osteoarthritis, unspecified site: Secondary | ICD-10-CM | POA: Diagnosis not present

## 2024-02-06 DIAGNOSIS — K209 Esophagitis, unspecified without bleeding: Secondary | ICD-10-CM | POA: Diagnosis not present

## 2024-02-06 DIAGNOSIS — Z6822 Body mass index (BMI) 22.0-22.9, adult: Secondary | ICD-10-CM | POA: Diagnosis not present

## 2024-02-06 DIAGNOSIS — R7989 Other specified abnormal findings of blood chemistry: Secondary | ICD-10-CM | POA: Diagnosis not present

## 2024-02-06 DIAGNOSIS — Z79899 Other long term (current) drug therapy: Secondary | ICD-10-CM | POA: Diagnosis not present

## 2024-03-18 DIAGNOSIS — J320 Chronic maxillary sinusitis: Secondary | ICD-10-CM | POA: Diagnosis not present

## 2024-03-18 DIAGNOSIS — J329 Chronic sinusitis, unspecified: Secondary | ICD-10-CM | POA: Diagnosis not present

## 2024-04-15 DIAGNOSIS — M1991 Primary osteoarthritis, unspecified site: Secondary | ICD-10-CM | POA: Diagnosis not present

## 2024-04-15 DIAGNOSIS — R7989 Other specified abnormal findings of blood chemistry: Secondary | ICD-10-CM | POA: Diagnosis not present

## 2024-04-15 DIAGNOSIS — M353 Polymyalgia rheumatica: Secondary | ICD-10-CM | POA: Diagnosis not present

## 2024-04-15 DIAGNOSIS — F19959 Other psychoactive substance use, unspecified with psychoactive substance-induced psychotic disorder, unspecified: Secondary | ICD-10-CM | POA: Diagnosis not present

## 2024-04-15 DIAGNOSIS — Z7952 Long term (current) use of systemic steroids: Secondary | ICD-10-CM | POA: Diagnosis not present

## 2024-04-15 DIAGNOSIS — K209 Esophagitis, unspecified without bleeding: Secondary | ICD-10-CM | POA: Diagnosis not present

## 2024-04-15 DIAGNOSIS — Z6822 Body mass index (BMI) 22.0-22.9, adult: Secondary | ICD-10-CM | POA: Diagnosis not present

## 2024-04-15 DIAGNOSIS — Z79899 Other long term (current) drug therapy: Secondary | ICD-10-CM | POA: Diagnosis not present

## 2024-04-21 DIAGNOSIS — M353 Polymyalgia rheumatica: Secondary | ICD-10-CM | POA: Diagnosis not present

## 2024-04-21 DIAGNOSIS — L821 Other seborrheic keratosis: Secondary | ICD-10-CM | POA: Diagnosis not present

## 2024-04-21 DIAGNOSIS — L820 Inflamed seborrheic keratosis: Secondary | ICD-10-CM | POA: Diagnosis not present

## 2024-04-21 DIAGNOSIS — D1801 Hemangioma of skin and subcutaneous tissue: Secondary | ICD-10-CM | POA: Diagnosis not present

## 2024-04-21 DIAGNOSIS — Z821 Family history of blindness and visual loss: Secondary | ICD-10-CM | POA: Diagnosis not present

## 2024-04-21 DIAGNOSIS — D485 Neoplasm of uncertain behavior of skin: Secondary | ICD-10-CM | POA: Diagnosis not present

## 2024-04-21 DIAGNOSIS — H462 Nutritional optic neuropathy: Secondary | ICD-10-CM | POA: Diagnosis not present

## 2024-05-16 DIAGNOSIS — K219 Gastro-esophageal reflux disease without esophagitis: Secondary | ICD-10-CM | POA: Diagnosis not present

## 2024-05-16 DIAGNOSIS — E038 Other specified hypothyroidism: Secondary | ICD-10-CM | POA: Diagnosis not present

## 2024-05-16 DIAGNOSIS — E785 Hyperlipidemia, unspecified: Secondary | ICD-10-CM | POA: Diagnosis not present

## 2024-05-19 DIAGNOSIS — E038 Other specified hypothyroidism: Secondary | ICD-10-CM | POA: Diagnosis not present

## 2024-05-19 DIAGNOSIS — K219 Gastro-esophageal reflux disease without esophagitis: Secondary | ICD-10-CM | POA: Diagnosis not present

## 2024-05-19 DIAGNOSIS — R82998 Other abnormal findings in urine: Secondary | ICD-10-CM | POA: Diagnosis not present

## 2024-05-19 DIAGNOSIS — M353 Polymyalgia rheumatica: Secondary | ICD-10-CM | POA: Diagnosis not present

## 2024-05-19 DIAGNOSIS — H9191 Unspecified hearing loss, right ear: Secondary | ICD-10-CM | POA: Diagnosis not present

## 2024-05-19 DIAGNOSIS — G43909 Migraine, unspecified, not intractable, without status migrainosus: Secondary | ICD-10-CM | POA: Diagnosis not present

## 2024-05-19 DIAGNOSIS — J309 Allergic rhinitis, unspecified: Secondary | ICD-10-CM | POA: Diagnosis not present

## 2024-05-19 DIAGNOSIS — E785 Hyperlipidemia, unspecified: Secondary | ICD-10-CM | POA: Diagnosis not present

## 2024-05-19 DIAGNOSIS — J45909 Unspecified asthma, uncomplicated: Secondary | ICD-10-CM | POA: Diagnosis not present

## 2024-05-19 DIAGNOSIS — Z0000 Encounter for general adult medical examination without abnormal findings: Secondary | ICD-10-CM | POA: Diagnosis not present

## 2024-05-19 DIAGNOSIS — H6121 Impacted cerumen, right ear: Secondary | ICD-10-CM | POA: Diagnosis not present

## 2024-05-19 DIAGNOSIS — Z780 Asymptomatic menopausal state: Secondary | ICD-10-CM | POA: Diagnosis not present

## 2024-05-19 DIAGNOSIS — M8580 Other specified disorders of bone density and structure, unspecified site: Secondary | ICD-10-CM | POA: Diagnosis not present

## 2024-06-13 ENCOUNTER — Other Ambulatory Visit: Payer: Self-pay | Admitting: Obstetrics and Gynecology

## 2024-06-13 DIAGNOSIS — Z01419 Encounter for gynecological examination (general) (routine) without abnormal findings: Secondary | ICD-10-CM

## 2024-06-13 NOTE — Telephone Encounter (Signed)
 Medication refill request: vivelle  dot patch 0.0375mg  Last AEX:  03-28-23 Next AEX: not scheduled. Message sent to scheduling department to call her Last MMG (if hormonal medication request): 07-11-23 birads 1:neg Refill authorized: please approve if appropriate

## 2024-07-16 DIAGNOSIS — R413 Other amnesia: Secondary | ICD-10-CM | POA: Diagnosis not present

## 2024-07-16 DIAGNOSIS — E785 Hyperlipidemia, unspecified: Secondary | ICD-10-CM | POA: Diagnosis not present

## 2024-07-16 DIAGNOSIS — E038 Other specified hypothyroidism: Secondary | ICD-10-CM | POA: Diagnosis not present

## 2024-07-16 DIAGNOSIS — Z1231 Encounter for screening mammogram for malignant neoplasm of breast: Secondary | ICD-10-CM | POA: Diagnosis not present

## 2024-07-16 LAB — HM MAMMOGRAPHY

## 2024-08-13 DIAGNOSIS — Z6822 Body mass index (BMI) 22.0-22.9, adult: Secondary | ICD-10-CM | POA: Diagnosis not present

## 2024-08-13 DIAGNOSIS — R7989 Other specified abnormal findings of blood chemistry: Secondary | ICD-10-CM | POA: Diagnosis not present

## 2024-08-13 DIAGNOSIS — M353 Polymyalgia rheumatica: Secondary | ICD-10-CM | POA: Diagnosis not present

## 2024-08-13 DIAGNOSIS — F19959 Other psychoactive substance use, unspecified with psychoactive substance-induced psychotic disorder, unspecified: Secondary | ICD-10-CM | POA: Diagnosis not present

## 2024-08-13 DIAGNOSIS — Z79899 Other long term (current) drug therapy: Secondary | ICD-10-CM | POA: Diagnosis not present

## 2024-08-13 DIAGNOSIS — K209 Esophagitis, unspecified without bleeding: Secondary | ICD-10-CM | POA: Diagnosis not present

## 2024-08-13 DIAGNOSIS — M1991 Primary osteoarthritis, unspecified site: Secondary | ICD-10-CM | POA: Diagnosis not present

## 2024-08-13 DIAGNOSIS — Z7952 Long term (current) use of systemic steroids: Secondary | ICD-10-CM | POA: Diagnosis not present

## 2024-08-14 ENCOUNTER — Ambulatory Visit (INDEPENDENT_AMBULATORY_CARE_PROVIDER_SITE_OTHER): Admitting: Obstetrics and Gynecology

## 2024-08-14 ENCOUNTER — Encounter: Payer: Self-pay | Admitting: Obstetrics and Gynecology

## 2024-08-14 VITALS — BP 124/72 | HR 60 | Ht 62.25 in | Wt 122.0 lb

## 2024-08-14 DIAGNOSIS — Z780 Asymptomatic menopausal state: Secondary | ICD-10-CM

## 2024-08-14 DIAGNOSIS — M8580 Other specified disorders of bone density and structure, unspecified site: Secondary | ICD-10-CM

## 2024-08-14 DIAGNOSIS — Z79818 Long term (current) use of other agents affecting estrogen receptors and estrogen levels: Secondary | ICD-10-CM

## 2024-08-14 DIAGNOSIS — Z79890 Hormone replacement therapy (postmenopausal): Secondary | ICD-10-CM | POA: Diagnosis not present

## 2024-08-14 MED ORDER — ESTRADIOL 0.0375 MG/24HR TD PTTW: MEDICATED_PATCH | TRANSDERMAL | 3 refills | Status: AC

## 2024-08-14 NOTE — Patient Instructions (Signed)

## 2024-08-14 NOTE — Progress Notes (Signed)
 GYNECOLOGY  VISIT   HPI: 73 y.o.   Married  Caucasian female   [phone] with No LMP recorded. Patient has had a hysterectomy.   here for: Med Check -  Vivelle      Prefers to continue ERT.   Dealing with osteoarthritis.  PMR is not active right now.  Takes Medrol  4 mg daily as needed.    Taking vit D 5000 international units daily.   Husband had a stroke this summer while they were traveling.  Enjoys golf and coin collecting.   GYNECOLOGIC HISTORY: No LMP recorded. Patient has had a hysterectomy. 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   Cystocele    Fibroid    High cholesterol    Menopausal symptoms    Migraine    Migraine    Osteopenia 2017   Resolved.  BMD normal in 2017.   PMR (polymyalgia rheumatica) 2022   PONV (postoperative nausea and vomiting)    Rectocele    Urinary incontinence    Uterine prolapse     Past Surgical History:  Procedure Laterality Date   ANTERIOR LAT LUMBAR FUSION Left 01/27/2020   Procedure: Left Lumbar three-four Lumbar four-five Anterolateral lumbar interbody fusion;  Surgeon:  Unice Pac, MD;  Location: [facility] OR;  Service: Neurosurgery;  Laterality: Left;   APPENDECTOMY     BACK SURGERY  1999   L4-L5   BLADDER SUSPENSION     CATARACT EXTRACTION, BILATERAL  07/2018   COLONOSCOPY  10/2011   HYMENECTOMY     54 - 71 years old   LUMBAR PERCUTANEOUS PEDICLE SCREW 2 LEVEL N/A 01/27/2020   Procedure: Percutaneous Pedicle Screw Fixation Lumbar three-Lumbar five;  Surgeon: Unice Pac, MD;  Location: [facility] OR;  Service: Neurosurgery;  Laterality: N/A;   NASAL SINUS SURGERY     TONSILLECTOMY     VAGINAL HYSTERECTOMY  2011   Vag. Hyst BSO A/P repair-Sling    Current Outpatient Medications  Medication Sig Dispense Refill   albuterol (VENTOLIN HFA) 108 (90 Base) MCG/ACT inhaler Inhale 2 puffs into the lungs as needed.     amitriptyline  (ELAVIL ) 50  MG tablet Take 1.5 tablets (75 mg total) by mouth at bedtime. 135 tablet 3   B Complex Vitamins (VITAMIN B COMPLEX PO) Take by mouth.     Bacillus Coagulans-Inulin (PROBIOTIC) 1-250 BILLION-MG CAPS as directed Orally     Biotin  5000 MCG TABS Take 5,000 mcg by mouth daily.      budesonide-formoterol  (SYMBICORT) 160-4.5 MCG/ACT inhaler Inhale 2 puffs into the lungs as needed.     Carboxymethylcell-Hypromellose (GENTEAL) 0.25-0.3 % GEL Place 1 drop into both eyes daily.     Cholecalciferol  (VITAMIN D ) 1.25 MG (50000 UT) CAPS Take by mouth.     Ergocalciferol  50 MCG (2000 UT) CAPS Take 4,000 Units by mouth daily.     estradiol  (VIVELLE -DOT) 0.0375 MG/24HR CHANGE/APPLY 1 PATCH TO SKIN TWICE EACH WEEK. APPLY ANYWHERE ON LOWER ABDOMEN 24 patch 0   fexofenadine (ALLEGRA) 180 MG tablet Take 180 mg by mouth daily.     fluticasone (FLONASE) 50 MCG/ACT nasal spray 2 sprays in each nostril Nasally Once a day     ibuprofen (ADVIL) 200 MG tablet 3 a day prn Orally 3 per day but as needed     methylPREDNISolone  (MEDROL ) 16 MG tablet 1 tablet twice a day for 2 days once a day for 3 days Orally Once a day; Duration: 5 days     Multiple Vitamin  (MULTIVITAMIN WITH MINERALS) TABS tablet Take 1 tablet by mouth daily.     ondansetron  (ZOFRAN -ODT) 8 MG disintegrating tablet 1/2 to 1 tab dissolved in mouth Orally q8h prn n/v; Duration: 30 day(s)     Probiotic Product (PROBIOTIC DAILY PO) Take 1 tablet by mouth daily. RAW probiotic     rosuvastatin  (CRESTOR ) 40 MG tablet Take 1 tablet (40 mg total) by mouth daily. 90 tablet 3   SUMAtriptan  (IMITREX ) 100 MG tablet Take 1 tablet at the onset of migraine.May repeat in 2 hours if headache persists or recurs. Only 2 tabs/24 hours 15 tablet 11   valACYclovir (VALTREX) 1000 MG tablet Take 1,000 mg by mouth daily.     No current facility-administered medications for this visit.     ALLERGIES: Hydrocodone -acetaminophen , Penicillins, Meloxicam, Other, Peanut (diagnostic), Prednisone, Zocor [simvastatin], Codeine, and Tramadol hcl  Family History  Problem Relation Age of Onset   Diabetes Mother    Leukemia Mother    Cancer Mother        leukemia   Heart disease Father    Retinitis pigmentosa Father    Migraines Father    Healthy Sister    Healthy Sister    Leukemia Brother    Healthy Child    BRCA 1/2 Child    BRCA 1/2 Child    Migraines Paternal Uncle     Social History   Socioeconomic History   Marital status: Married    Spouse name: Elspeth   Number of children: 2   Years of education: 16   Highest education level: Not on file  Occupational History    Employer: [employer]    Comment: [employer]  Tobacco Use   Smoking status: Former    Current packs/day: 0.00    Types: Cigarettes    Quit date: 05/08/1972    Years since quitting: 52.3   Smokeless tobacco: Never   Tobacco comments:    in college  Vaping Use   Vaping status: Never Used  Substance and Sexual Activity   Alcohol  use: Yes    Alcohol /week: 1.0 standard drink of alcohol     Types: 1 Glasses of wine per  week   Drug use: No   Sexual activity: Not Currently    Partners: Male    Birth control/protection: Surgical     Comment: hysterectomy, Less than 5, after 14, no std, no breast cancer, no DES, no abnormal pap  Other Topics Concern   Not on file  Social History Narrative   Right handed.  Caffeine 2 cups daily, Married, 2 kids, 5 grand kids.    Social Drivers of Corporate investment banker Strain: Not on file  Food Insecurity: Not on file  Transportation Needs: Not on file  Physical Activity: Not on file  Stress: Not on file  Social Connections: Not on file  Intimate Partner Violence: Not on file    Review of Systems  All other systems reviewed and are negative.   PHYSICAL EXAMINATION:   BP 124/72 (BP Location: Left Arm, Patient Position: Sitting)   Pulse 60   Ht 5' 2.25 (1.581 m)   Wt 122 lb (55.3 kg)   SpO2 99%   BMI 22.14 kg/m     General appearance: alert, cooperative and appears stated age Head: Normocephalic, without obvious abnormality, atraumatic Neck: no adenopathy, supple, symmetrical, trachea midline and thyroid  normal to inspection and palpation Lungs: clear to auscultation bilaterally Breasts: normal appearance, no masses or tenderness, No nipple retraction or dimpling, No nipple discharge or bleeding, No axillary or supraclavicular adenopathy Heart: regular rate and rhythm Abdomen: soft, non-tender, no masses,  no organomegaly Extremities: extremities normal, atraumatic, no cyanosis or edema Skin: Skin color, texture, turgor normal. No rashes or lesions Lymph nodes: Cervical, supraclavicular, and axillary nodes normal. No abnormal inguinal nodes palpated Neurologic: Grossly normal  Pelvic: External genitalia:  no lesions              Urethra:  normal appearing urethra with no masses, tenderness or lesions              Bartholins and Skenes: normal                 Vagina: normal appearing vagina with normal color and discharge, no lesions              Cervix: absent.                 Bimanual Exam:  Uterus:  absent.              Adnexa: no mass, fullness, tenderness               Rectal exam: yes.  Confirms.              Anus:  normal sphincter tone, no lesions  Chaperone was present for exam:  Kari HERO, CMA.  ASSESSMENT:  Status post TVH with BSO and prolapse repair. Hx hymenectomy.  Menopausal female.  ERT patient.  Osteopenia bilateral hips. FH CAD. Polymyalgia rheumatica. FH BRCA2 husband and two daughters.  Patient tested negative.   PLAN:  Mammogram and self breast exam recommended.  Discused WHI and use of HRT which can increase risk of PE, DV, stroke. Benefits of increasing well being and reducing risk of osteoporosis also reviewed.  Refill of Vivelle  Dot given. Calcium  intake of at least 1200 mg daily and vit D intake 600 - 800 international units reviewed. Weight bearing exercise recommended.  Will fax order to [facility] for BMD.  FU yearly and prn.   35 min  total time was spent for this patient encounter, including preparation, face-to-face counseling with the patient, coordination  of care, and documentation of the encounter.

## 2024-09-09 DIAGNOSIS — H6121 Impacted cerumen, right ear: Secondary | ICD-10-CM | POA: Diagnosis not present

## 2024-09-09 DIAGNOSIS — H9191 Unspecified hearing loss, right ear: Secondary | ICD-10-CM | POA: Diagnosis not present

## 2024-09-25 ENCOUNTER — Encounter: Payer: Self-pay | Admitting: Adult Health

## 2024-09-25 ENCOUNTER — Ambulatory Visit: Payer: PPO | Admitting: Adult Health

## 2024-09-25 VITALS — BP 137/63 | HR 58 | Ht 63.0 in | Wt 125.0 lb

## 2024-09-25 DIAGNOSIS — G43001 Migraine without aura, not intractable, with status migrainosus: Secondary | ICD-10-CM | POA: Diagnosis not present

## 2024-09-25 DIAGNOSIS — G479 Sleep disorder, unspecified: Secondary | ICD-10-CM

## 2024-09-25 MED ORDER — AMITRIPTYLINE HCL 50 MG PO TABS: 100.0000 mg | ORAL_TABLET | Freq: Every day | ORAL | 3 refills | Status: AC

## 2024-09-25 NOTE — Progress Notes (Signed)
 .meg    PATIENT: Courtney Horne DOB: [birth year]  REASON FOR VISIT: follow up HISTORY FROM: patient Chief Complaint  Patient presents with   Migraine    RM 3 alone  Pt is well and stable, reports she may have 1 migraine a month. No new concerns.      HISTORY OF PRESENT ILLNESS: Today 09/25/24:  Courtney Horne is a 73 y.o. female with a history of migraines. Returns today for follow-up.  She reports overall she has been doing well.  Having approximately 1 migraine a month.  Usually if she takes sumatriptan  as soon as the headache starts it resolves fairly quickly.  At our last visit a year ago she was referred to ENT but they did not have any significant findings.  Subsequently symptoms also improved.  Patient states that amitriptyline  also helps with her sleep.  In the past she has been on 100 mg.  She states that lately she has noticed that she wakes several times at night.  She also reports that her husband recently had a stroke but is doing well.  She would like to go back to 100 mg of amitriptyline .     09/26/23: Courtney Horne is a 73 y.o. female  with a history of migraines managed with amitriptyline  75mg  at bedtime and sumatriptan  as needed.. She reports that the amitriptyline  is generally effective. She has identified a threshold of pain at which she needs to take sumatriptan  for her migraines, and reports that she only needed to take a second dose once in the past year. Her migraines occur in clusters, with several days of headaches followed by 1-2 weeks of relief.  In addition to her migraines, the patient has been experiencing significant sinus pressure, particularly in the forehead area- between the eyebrows. She has been managing this with daily Allegra and twice-daily Flonase, as recommended by her allergist, but has not seen significant improvement. The pressure is particularly noticeable when she bends over, and can become quite painful by the end of the day. This  pressure does not always evolve into a headache.  She reported similar symptoms to last time she was seen.  Has never seen ENT.    Discussed the use of AI scribe software for clinical note transcription with the patient, who gave verbal consent to proceed.     02/15/23: Courtney Horne is a 73 y.o. female with a history of migraine headaches. Returns today for follow-up. She has been taking amitriptyline  75 mg at bedtime. Reports that headaches are weather related. Reports she did have a severe migraine over the weekend. She states that if she bends over she feels pressure in the forehead. Not sure if this is related to her sinuses.  She states that she has been hesitant to take Imitrex  as soon as she feels her symptoms because she is concerned it may be a sinus headache versus a true migraine.  Patient also reports that she has seen her ophthalmologist and her retinal specialist due to having some trouble seeing road signs that are along distance away.  She states that the exams have been relatively unremarkable she sees the retinal specialist again in 3 months    12/15/21: Courtney Horne is a 73 year old female with a history of migraine headaches.  She returns today for follow-up.  She reports her headaches continue to be managed well with amitriptyline .  She only has approximately 1 headache a month.  Sometimes is 1 headache every other month.  Imitrex   continues to resolve her headache fairly quickly.  She returns today for evaluation.  11/01/20: Courtney Horne is a 73 year old female with a history migraine headaches. She returns today for follow-up.  She states overall she has been doing well with her headaches.  She has approximately 1-2 headaches a month.  She continues to use amitriptyline  and Imitrex .  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  She states that patient did not start until after she got the  Covid booster.  She has had x-rays with her orthopedist that was unremarkable.  Her sedimentation rate was elevated and she has a referral to rheumatology.  She returns today for an evaluation.   10/28/19: Courtney Horne is a 73 year old female with a history of migraine headaches.  She returns today for follow-up.  She states that her headaches have been under relatively good control.  She may have approximately 3 headaches a month.  She states that her headaches are typically triggered by alcohol  or weather.  She states that Imitrex  continues to work well for her.  She states that she golfs 3 times a week.  If it is too cold to golf she will go walking.  She also does puzzles.  She states that she has been taking amitriptyline  75 mg for approximately 10 years.  She states that it is worked well for her migraines and her sleep.  She has noticed recently she has been waking up at least once at night and has difficulty going back to sleep.  She is questioning whether amitriptyline  could be increased to 100 mg.  She denies any issues with constipation, urinary retention, blurred vision or dry mouth.  I reviewed recent EKG completed November 2020.   HISTORY 10/09/18:   Courtney Horne is a 73 year old female with a history of migraine.  She returns today for follow-up.  She reports that her headaches are relatively good control.  She has approximately 1 headache a week.  Her headaches tend to occur on the left side of the face.  She denies photophobia or phonophobia.  She states that she takes Imitrex  her headache typically resolves in 15 to 30 minutes.  She notes to that with amitriptyline  she was sleeping well but now she is waking up 2-3 times at night.  She returns today for evaluation.  REVIEW OF SYSTEMS: Out of a complete 14 system review of symptoms, the patient complains only of the following symptoms, and all other reviewed systems are negative.  See HPI  ALLERGIES: Allergies  Allergen Reactions    Hydrocodone -Acetaminophen  Nausea Only   Penicillins Itching and Rash    Did it involve swelling of the face/tongue/throat, SOB, or low BP? No Did it involve sudden or severe rash/hives, skin peeling, or any reaction on the inside of your mouth or nose? No Did you need to seek medical attention at a hospital or doctor's office? No When did it last happen?   15 years ago   If all above answers are NO, may proceed with cephalosporin use.    Meloxicam Itching and Other (See Comments)   Other Other (See Comments)   Peanut (Diagnostic) Other (See Comments)   Prednisone Other (See Comments)   Zocor [Simvastatin] Other (See Comments)    UNSPECIFIED REACTION    Codeine Nausea And Vomiting and Other (See Comments)   Tramadol Hcl Itching    HOME MEDICATIONS: Outpatient Medications Prior to  Visit  Medication Sig Dispense Refill   albuterol (VENTOLIN HFA) 108 (90 Base) MCG/ACT inhaler Inhale 2 puffs into the lungs as needed.     amitriptyline  (ELAVIL ) 50 MG tablet Take 1.5 tablets (75 mg total) by mouth at bedtime. 135 tablet 3   B Complex Vitamins (VITAMIN B COMPLEX PO) Take by mouth.     Biotin  5000 MCG TABS Take 5,000 mcg by mouth daily.      budesonide-formoterol  (SYMBICORT) 160-4.5 MCG/ACT inhaler Inhale 2 puffs into the lungs as needed.     Carboxymethylcell-Hypromellose (GENTEAL) 0.25-0.3 % GEL Place 1 drop into both eyes daily.     Cholecalciferol  (VITAMIN D ) 1.25 MG (50000 UT) CAPS Take by mouth.     estradiol  (VIVELLE -DOT) 0.0375 MG/24HR CHANGE/APPLY 1 PATCH TO SKIN TWICE EACH WEEK. APPLY ANYWHERE ON LOWER ABDOMEN 24 patch 3   fexofenadine (ALLEGRA) 180 MG tablet Take 180 mg by mouth daily.     fluticasone (FLONASE) 50 MCG/ACT nasal spray 2 sprays in each nostril Nasally Once a day     ibuprofen (ADVIL) 200 MG tablet 3 a day prn Orally 3 per day but as needed     methylPREDNISolone  (MEDROL ) 16 MG tablet 1 tablet twice a day for 2 days once a day for 3 days Orally Once a day; Duration:  5 days     Multiple Vitamin (MULTIVITAMIN WITH MINERALS) TABS tablet Take 1 tablet by mouth daily.     Probiotic Product (PROBIOTIC DAILY PO) Take 1 tablet by mouth daily. RAW probiotic     rosuvastatin  (CRESTOR ) 40 MG tablet Take 1 tablet (40 mg total) by mouth daily. 90 tablet 3   SUMAtriptan  (IMITREX ) 100 MG tablet Take 1 tablet at the onset of migraine.May repeat in 2 hours if headache persists or recurs. Only 2 tabs/24 hours 15 tablet 11   valACYclovir (VALTREX) 1000 MG tablet Take 1,000 mg by mouth daily.     Bacillus Coagulans-Inulin (PROBIOTIC) 1-250 BILLION-MG CAPS as directed Orally     Ergocalciferol  50 MCG (2000 UT) CAPS Take 4,000 Units by mouth daily.     ondansetron  (ZOFRAN -ODT) 8 MG disintegrating tablet 1/2 to 1 tab dissolved in mouth Orally q8h prn n/v; Duration: 30 day(s)     No facility-administered medications prior to visit.    PAST MEDICAL HISTORY: Past Medical History:  Diagnosis Date   Adenomyosis    Asthma    really well controlled   Asthma    BRCA gene mutation negative    COVID-19 09/2021   had headache/nausea/ runny nose.   Cystocele    Fibroid    High cholesterol    Menopausal symptoms    Migraine    Migraine    Osteopenia 2017   Resolved.  BMD normal in 2017.   PMR (polymyalgia rheumatica) 2022   PONV (postoperative nausea and vomiting)    Rectocele    Urinary incontinence    Uterine prolapse     PAST SURGICAL HISTORY: Past Surgical History:  Procedure Laterality Date   ANTERIOR LAT LUMBAR FUSION Left 01/27/2020   Procedure: Left Lumbar three-four Lumbar four-five Anterolateral lumbar interbody fusion;  Surgeon: Unice Pac, MD;  Location: [facility] OR;  Service: Neurosurgery;  Laterality: Left;   APPENDECTOMY     BACK SURGERY  1999   L4-L5   BLADDER SUSPENSION     CATARACT EXTRACTION, BILATERAL  07/2018   COLONOSCOPY  10/2011   HYMENECTOMY     59 - 13 years old   LUMBAR PERCUTANEOUS PEDICLE SCREW  2 LEVEL N/A 01/27/2020   Procedure:  Percutaneous Pedicle Screw Fixation Lumbar three-Lumbar five;  Surgeon: Unice Pac, MD;  Location: [facility] OR;  Service: Neurosurgery;  Laterality: N/A;   NASAL SINUS SURGERY     TONSILLECTOMY     VAGINAL HYSTERECTOMY  2011   Vag. Hyst BSO A/P repair-Sling    FAMILY HISTORY: Family History  Problem Relation Age of Onset   Diabetes Mother    Leukemia Mother    Cancer Mother        leukemia   Heart disease Father    Retinitis pigmentosa Father    Migraines Father    Healthy Sister    Healthy Sister    Leukemia Brother    Healthy Child    BRCA 1/2 Child    BRCA 1/2 Child    Migraines Paternal Uncle     SOCIAL HISTORY: Social History   Socioeconomic History   Marital status: Married    Spouse name: Elspeth   Number of children: 2   Years of education: 16   Highest education level: Not on file  Occupational History    Employer: [employer]    Comment: [employer]  Tobacco Use   Smoking status: Former    Current packs/day: 0.00    Types: Cigarettes    Quit date: 05/08/1972    Years since quitting: 52.4   Smokeless tobacco: Never   Tobacco comments:    in college  Vaping Use   Vaping status: Never Used  Substance and Sexual Activity   Alcohol  use: Yes    Alcohol /week: 1.0 standard drink of alcohol     Types: 1 Glasses of wine per week   Drug use: No   Sexual activity: Not Currently    Partners: Male    Birth control/protection: Surgical    Comment: hysterectomy, Less than 5, after 14, no std, no breast cancer, no DES, no abnormal pap  Other Topics Concern   Not on file  Social History Narrative   Right handed.  Caffeine 2 cups daily, Married, 2 kids, 5 grand kids.    Social Drivers of Corporate Investment Banker Strain: Not on file  Food Insecurity: Not on file  Transportation Needs: Not on file  Physical Activity: Not on file  Stress: Not on file  Social Connections: Not on file  Intimate Partner Violence: Not on file      PHYSICAL EXAM  Vitals:   09/25/24  1105  BP: 137/63  Pulse: (!) 58  Weight: 125 lb (56.7 kg)  Height: 5' 3 (1.6 m)    Body mass index is 22.14 kg/m.  Generalized: Well developed, in no acute distress   Neurological examination  Mentation: Alert oriented to time, place, history taking. Follows all commands speech and language fluent Cranial nerve II-XII: Pupils were equal round reactive to light. Extraocular movements were full, visual field were full on confrontational test. . Head turning and shoulder shrug  were normal and symmetric. Motor: The motor testing reveals 5 over 5 strength of all 4 extremities. Good symmetric motor tone is noted throughout.  Sensory: Sensory testing is intact to soft touch on all 4 extremities. No evidence of extinction is noted.  Coordination: Cerebellar testing reveals good finger-nose-finger and heel-to-shin bilaterally.  Gait and station: Gait is normal.    DIAGNOSTIC DATA (LABS, IMAGING, TESTING) - I reviewed patient records, labs, notes, testing and imaging myself where available.  Lab Results  Component Value Date   WBC 5.5 10/16/2023   HGB 13.4  10/16/2023   HCT 40.2 10/16/2023   MCV 89 10/16/2023   PLT 205 10/16/2023      Component Value Date/Time   NA 140 10/16/2023 0903   K 4.2 10/16/2023 0903   CL 104 10/16/2023 0903   CO2 25 10/16/2023 0903   GLUCOSE 94 10/16/2023 0903   GLUCOSE 107 (H) 01/23/2020 1206   BUN 19 10/16/2023 0903   CREATININE 0.80 10/16/2023 0903   CREATININE 0.92 01/10/2016 0934   CALCIUM  9.7 10/16/2023 0903   PROT 6.6 10/16/2023 0904   ALBUMIN 4.3 10/16/2023 0904   AST 34 10/16/2023 0904   ALT 29 10/16/2023 0904   ALKPHOS 84 10/16/2023 0904   BILITOT 1.0 10/16/2023 0904   GFRNONAA 65 10/06/2020 0827   GFRAA 74 10/06/2020 0827   Lab Results  Component Value Date   CHOL 160 05/11/2017   HDL 73 05/11/2017   LDLCALC 104 (H) 01/23/2017   TRIG 65 05/11/2017   CHOLHDL 3.1 01/23/2017   Lab Results  Component Value Date   HGBA1C 5.8 (H)  10/16/2023    Lab Results  Component Value Date   TSH 5.550 (H) 10/12/2023      ASSESSMENT AND PLAN 73 y.o. year old female  has a past medical history of Adenomyosis, Asthma, Asthma, BRCA gene mutation negative, COVID-19 (09/2021), Cystocele, Fibroid, High cholesterol, Menopausal symptoms, Migraine, Migraine, Osteopenia (2017), PMR (polymyalgia rheumatica) (2022), PONV (postoperative nausea and vomiting), Rectocele, Urinary incontinence, and Uterine prolapse. here with :  1.  Migraine headaches 2.  Sleep disturbance   -- Increase amitriptyline  to 100 mg at bedtime.  We reviewed potential side effects and I provided information on her AVS.  Advised her to monitor her symptoms.  If she has any side effects we may need to decrease the dose -- advised to call if symptoms worsen or she develop new symptoms -- Follow-up in 1 year or sooner if needed   Duwaine Russell, MSN, NP-C 09/25/2024, 11:11 AM [facility] [address], [address] [place], [place] [address] [phone]  The patient's condition requires frequent monitoring and adjustments in the treatment plan, reflecting the ongoing complexity of care.  This provider is the continuing focal point for all needed services for this condition.

## 2024-09-25 NOTE — Patient Instructions (Addendum)
 Your Plan:  Increase Amitriptyline  100 mg at bedtime. Monitor for side effects. Review medication handout. If your symptoms worsen or you develop new symptoms please let us  know.    Thank you for coming to see us  at [facility]. I hope we have been able to provide you high quality care today.  You may receive a patient satisfaction survey over the next few weeks. We would appreciate your feedback and comments so that we may continue to improve ourselves and the health of our patients.

## 2024-10-02 DIAGNOSIS — Z780 Asymptomatic menopausal state: Secondary | ICD-10-CM | POA: Diagnosis not present

## 2024-10-09 ENCOUNTER — Other Ambulatory Visit: Payer: Self-pay | Admitting: Adult Health

## 2024-10-09 DIAGNOSIS — G43001 Migraine without aura, not intractable, with status migrainosus: Secondary | ICD-10-CM

## 2024-11-26 NOTE — Progress Notes (Signed)
 Courtney Horne                                          MRN: [id number]   11/26/2024   The [facility] Quality Team Specialist reviewed this patient medical record for the purposes of chart review for care gap closure. The following were reviewed: chart review for care gap closure-glycemic status assessment.    [facility] Quality Team

## 2024-12-01 ENCOUNTER — Telehealth: Payer: Self-pay | Admitting: Internal Medicine

## 2024-12-01 DIAGNOSIS — E785 Hyperlipidemia, unspecified: Secondary | ICD-10-CM

## 2024-12-01 DIAGNOSIS — Z8249 Family history of ischemic heart disease and other diseases of the circulatory system: Secondary | ICD-10-CM

## 2024-12-01 NOTE — Telephone Encounter (Signed)
 Patient is requesting orders for lab work prior to 2/19 appointment.

## 2024-12-03 NOTE — Telephone Encounter (Signed)
 Patinet should have :  CMET, CBC, Vit D, A1C, TSH and NMR panel prior to visit

## 2024-12-03 NOTE — Telephone Encounter (Signed)
 Called pt advised of MD recommendation:  Patinet should have :  CMET, CBC, Vit D, A1C, TSH and NMR panel prior to visit     Pt expresses understanding all questions answered; labs ordered and released for draw.

## 2024-12-03 NOTE — Addendum Note (Signed)
 Addended by: RANDY HAMP SAILOR on: 12/03/2024 02:43 PM   Modules accepted: Orders

## 2025-01-08 ENCOUNTER — Ambulatory Visit: Admitting: Internal Medicine

## 2025-10-08 ENCOUNTER — Ambulatory Visit: Admitting: Adult Health
